# Patient Record
Sex: Female | Born: 1945 | Race: Black or African American | Hispanic: No | Smoking: Former smoker
Health system: Southern US, Community
[De-identification: ages and names within clinical notes are randomized; demographics above are authoritative.]

## PROBLEM LIST (undated history)

## (undated) DIAGNOSIS — B1920 Unspecified viral hepatitis C without hepatic coma: Secondary | ICD-10-CM

## (undated) DIAGNOSIS — K7460 Unspecified cirrhosis of liver: Secondary | ICD-10-CM

## (undated) DIAGNOSIS — G8929 Other chronic pain: Secondary | ICD-10-CM

## (undated) DIAGNOSIS — F319 Bipolar disorder, unspecified: Secondary | ICD-10-CM

## (undated) DIAGNOSIS — Z87442 Personal history of urinary calculi: Secondary | ICD-10-CM

## (undated) DIAGNOSIS — H409 Unspecified glaucoma: Secondary | ICD-10-CM

## (undated) DIAGNOSIS — M549 Dorsalgia, unspecified: Secondary | ICD-10-CM

## (undated) DIAGNOSIS — F0280 Dementia in other diseases classified elsewhere without behavioral disturbance: Secondary | ICD-10-CM

## (undated) DIAGNOSIS — D696 Thrombocytopenia, unspecified: Secondary | ICD-10-CM

## (undated) DIAGNOSIS — B182 Chronic viral hepatitis C: Secondary | ICD-10-CM

## (undated) DIAGNOSIS — M1990 Unspecified osteoarthritis, unspecified site: Secondary | ICD-10-CM

## (undated) DIAGNOSIS — G9340 Encephalopathy, unspecified: Secondary | ICD-10-CM

## (undated) DIAGNOSIS — F419 Anxiety disorder, unspecified: Secondary | ICD-10-CM

## (undated) DIAGNOSIS — F32A Depression, unspecified: Secondary | ICD-10-CM

## (undated) DIAGNOSIS — F329 Major depressive disorder, single episode, unspecified: Secondary | ICD-10-CM

## (undated) DIAGNOSIS — G309 Alzheimer's disease, unspecified: Secondary | ICD-10-CM

## (undated) DIAGNOSIS — I10 Essential (primary) hypertension: Secondary | ICD-10-CM

## (undated) DIAGNOSIS — R32 Unspecified urinary incontinence: Secondary | ICD-10-CM

## (undated) HISTORY — DX: Depression, unspecified: F32.A

## (undated) HISTORY — DX: Essential (primary) hypertension: I10

## (undated) HISTORY — PX: CATARACT EXTRACTION W/ INTRAOCULAR LENS  IMPLANT, BILATERAL: SHX1307

## (undated) HISTORY — DX: Thrombocytopenia, unspecified: D69.6

## (undated) HISTORY — DX: Unspecified glaucoma: H40.9

## (undated) HISTORY — DX: Unspecified urinary incontinence: R32

## (undated) HISTORY — DX: Encephalopathy, unspecified: G93.40

## (undated) HISTORY — DX: Unspecified osteoarthritis, unspecified site: M19.90

## (undated) HISTORY — DX: Bipolar disorder, unspecified: F31.9

## (undated) HISTORY — DX: Major depressive disorder, single episode, unspecified: F32.9

## (undated) HISTORY — PX: POLYPECTOMY: SHX149

## (undated) HISTORY — DX: Other chronic pain: G89.29

## (undated) HISTORY — PX: CHOLECYSTECTOMY: SHX55

## (undated) HISTORY — DX: Unspecified cirrhosis of liver: K74.60

## (undated) HISTORY — DX: Chronic viral hepatitis C: B18.2

## (undated) HISTORY — PX: EYE SURGERY: SHX253

## (undated) HISTORY — DX: Dorsalgia, unspecified: M54.9

## (undated) HISTORY — DX: Anxiety disorder, unspecified: F41.9

## (undated) HISTORY — DX: Unspecified viral hepatitis C without hepatic coma: B19.20

---

## 2001-05-09 ENCOUNTER — Emergency Department (HOSPITAL_COMMUNITY): Admission: EM | Admit: 2001-05-09 | Discharge: 2001-05-09 | Payer: Self-pay | Admitting: Emergency Medicine

## 2001-06-05 ENCOUNTER — Ambulatory Visit (HOSPITAL_COMMUNITY): Admission: RE | Admit: 2001-06-05 | Discharge: 2001-06-05 | Payer: Self-pay | Admitting: Family Medicine

## 2001-06-05 ENCOUNTER — Encounter: Payer: Self-pay | Admitting: Family Medicine

## 2001-07-23 ENCOUNTER — Ambulatory Visit (HOSPITAL_COMMUNITY): Admission: RE | Admit: 2001-07-23 | Discharge: 2001-07-23 | Payer: Self-pay | Admitting: Family Medicine

## 2001-07-23 ENCOUNTER — Encounter: Payer: Self-pay | Admitting: Family Medicine

## 2001-08-06 ENCOUNTER — Ambulatory Visit (HOSPITAL_COMMUNITY): Admission: RE | Admit: 2001-08-06 | Discharge: 2001-08-06 | Payer: Self-pay | Admitting: Family Medicine

## 2001-08-07 ENCOUNTER — Encounter: Payer: Self-pay | Admitting: Family Medicine

## 2002-07-07 ENCOUNTER — Other Ambulatory Visit: Admission: RE | Admit: 2002-07-07 | Discharge: 2002-07-07 | Payer: Self-pay | Admitting: Family Medicine

## 2002-07-11 ENCOUNTER — Ambulatory Visit (HOSPITAL_COMMUNITY): Admission: RE | Admit: 2002-07-11 | Discharge: 2002-07-11 | Payer: Self-pay | Admitting: Family Medicine

## 2002-08-06 ENCOUNTER — Encounter: Payer: Self-pay | Admitting: Ophthalmology

## 2002-08-08 ENCOUNTER — Encounter: Payer: Self-pay | Admitting: Ophthalmology

## 2002-08-08 ENCOUNTER — Ambulatory Visit (HOSPITAL_COMMUNITY): Admission: RE | Admit: 2002-08-08 | Discharge: 2002-08-08 | Payer: Self-pay | Admitting: Ophthalmology

## 2003-02-23 ENCOUNTER — Ambulatory Visit (HOSPITAL_COMMUNITY): Admission: RE | Admit: 2003-02-23 | Discharge: 2003-02-23 | Payer: Self-pay | Admitting: Ophthalmology

## 2003-07-15 ENCOUNTER — Ambulatory Visit (HOSPITAL_COMMUNITY): Admission: RE | Admit: 2003-07-15 | Discharge: 2003-07-15 | Payer: Self-pay | Admitting: Family Medicine

## 2003-09-29 ENCOUNTER — Other Ambulatory Visit: Admission: RE | Admit: 2003-09-29 | Discharge: 2003-09-29 | Payer: Self-pay | Admitting: Family Medicine

## 2004-07-06 ENCOUNTER — Ambulatory Visit: Payer: Self-pay | Admitting: Family Medicine

## 2004-07-13 ENCOUNTER — Ambulatory Visit: Payer: Self-pay | Admitting: Family Medicine

## 2004-08-15 ENCOUNTER — Ambulatory Visit (HOSPITAL_COMMUNITY): Admission: RE | Admit: 2004-08-15 | Discharge: 2004-08-15 | Payer: Self-pay | Admitting: Ophthalmology

## 2004-08-19 ENCOUNTER — Ambulatory Visit: Payer: Self-pay | Admitting: Family Medicine

## 2004-08-24 ENCOUNTER — Ambulatory Visit (HOSPITAL_COMMUNITY): Admission: RE | Admit: 2004-08-24 | Discharge: 2004-08-24 | Payer: Self-pay | Admitting: Family Medicine

## 2004-11-09 ENCOUNTER — Ambulatory Visit: Payer: Self-pay | Admitting: Family Medicine

## 2004-12-02 ENCOUNTER — Ambulatory Visit (HOSPITAL_COMMUNITY): Admission: RE | Admit: 2004-12-02 | Discharge: 2004-12-02 | Payer: Self-pay | Admitting: Ophthalmology

## 2004-12-09 ENCOUNTER — Ambulatory Visit: Payer: Self-pay | Admitting: Family Medicine

## 2005-01-02 ENCOUNTER — Ambulatory Visit: Payer: Self-pay | Admitting: Family Medicine

## 2005-03-08 ENCOUNTER — Ambulatory Visit: Payer: Self-pay | Admitting: Family Medicine

## 2005-04-12 ENCOUNTER — Ambulatory Visit: Payer: Self-pay | Admitting: Family Medicine

## 2005-04-12 ENCOUNTER — Other Ambulatory Visit: Admission: RE | Admit: 2005-04-12 | Discharge: 2005-04-12 | Payer: Self-pay | Admitting: Family Medicine

## 2005-08-09 ENCOUNTER — Ambulatory Visit: Payer: Self-pay | Admitting: Family Medicine

## 2005-09-01 ENCOUNTER — Ambulatory Visit (HOSPITAL_COMMUNITY): Admission: RE | Admit: 2005-09-01 | Discharge: 2005-09-01 | Payer: Self-pay | Admitting: Family Medicine

## 2005-09-20 ENCOUNTER — Ambulatory Visit: Payer: Self-pay | Admitting: Family Medicine

## 2005-11-09 ENCOUNTER — Ambulatory Visit (HOSPITAL_COMMUNITY): Admission: RE | Admit: 2005-11-09 | Discharge: 2005-11-09 | Payer: Self-pay | Admitting: Hematology and Oncology

## 2005-11-09 ENCOUNTER — Ambulatory Visit: Payer: Self-pay | Admitting: Family Medicine

## 2006-01-12 ENCOUNTER — Ambulatory Visit: Payer: Self-pay | Admitting: Family Medicine

## 2006-02-01 ENCOUNTER — Ambulatory Visit: Payer: Self-pay | Admitting: Gastroenterology

## 2006-02-09 ENCOUNTER — Encounter (INDEPENDENT_AMBULATORY_CARE_PROVIDER_SITE_OTHER): Payer: Self-pay | Admitting: Specialist

## 2006-02-09 ENCOUNTER — Ambulatory Visit: Payer: Self-pay | Admitting: Gastroenterology

## 2006-03-14 ENCOUNTER — Ambulatory Visit: Payer: Self-pay | Admitting: Family Medicine

## 2006-03-29 ENCOUNTER — Ambulatory Visit: Payer: Self-pay | Admitting: Family Medicine

## 2006-07-06 ENCOUNTER — Ambulatory Visit: Payer: Self-pay | Admitting: Family Medicine

## 2006-07-11 ENCOUNTER — Ambulatory Visit (HOSPITAL_COMMUNITY): Admission: RE | Admit: 2006-07-11 | Discharge: 2006-07-11 | Payer: Self-pay | Admitting: Internal Medicine

## 2006-08-09 ENCOUNTER — Ambulatory Visit: Payer: Self-pay | Admitting: Family Medicine

## 2006-09-03 ENCOUNTER — Ambulatory Visit: Payer: Self-pay | Admitting: Family Medicine

## 2006-09-05 ENCOUNTER — Ambulatory Visit (HOSPITAL_COMMUNITY): Admission: RE | Admit: 2006-09-05 | Discharge: 2006-09-05 | Payer: Self-pay | Admitting: Internal Medicine

## 2006-11-02 ENCOUNTER — Ambulatory Visit (HOSPITAL_COMMUNITY): Admission: RE | Admit: 2006-11-02 | Discharge: 2006-11-02 | Payer: Self-pay | Admitting: General Surgery

## 2006-11-06 ENCOUNTER — Ambulatory Visit (HOSPITAL_COMMUNITY): Admission: RE | Admit: 2006-11-06 | Discharge: 2006-11-07 | Payer: Self-pay | Admitting: General Surgery

## 2007-01-24 ENCOUNTER — Ambulatory Visit: Payer: Self-pay | Admitting: Family Medicine

## 2007-03-26 ENCOUNTER — Ambulatory Visit: Payer: Self-pay | Admitting: Family Medicine

## 2007-04-15 ENCOUNTER — Ambulatory Visit: Payer: Self-pay | Admitting: Internal Medicine

## 2007-04-18 ENCOUNTER — Ambulatory Visit (HOSPITAL_COMMUNITY): Admission: RE | Admit: 2007-04-18 | Discharge: 2007-04-18 | Payer: Self-pay | Admitting: Internal Medicine

## 2007-08-22 ENCOUNTER — Ambulatory Visit: Payer: Self-pay | Admitting: Internal Medicine

## 2007-09-04 ENCOUNTER — Ambulatory Visit: Payer: Self-pay | Admitting: Internal Medicine

## 2007-09-04 ENCOUNTER — Encounter: Payer: Self-pay | Admitting: Family Medicine

## 2007-09-04 ENCOUNTER — Other Ambulatory Visit: Admission: RE | Admit: 2007-09-04 | Discharge: 2007-09-04 | Payer: Self-pay | Admitting: Family Medicine

## 2007-09-09 ENCOUNTER — Ambulatory Visit (HOSPITAL_COMMUNITY): Admission: RE | Admit: 2007-09-09 | Discharge: 2007-09-09 | Payer: Self-pay | Admitting: Family Medicine

## 2007-11-25 ENCOUNTER — Encounter (INDEPENDENT_AMBULATORY_CARE_PROVIDER_SITE_OTHER): Payer: Self-pay | Admitting: Family Medicine

## 2007-11-25 ENCOUNTER — Ambulatory Visit: Payer: Self-pay | Admitting: Internal Medicine

## 2007-11-25 LAB — CONVERTED CEMR LAB: TSH: 0.577 microintl units/mL (ref 0.350–5.50)

## 2007-12-03 ENCOUNTER — Ambulatory Visit (HOSPITAL_COMMUNITY): Admission: RE | Admit: 2007-12-03 | Discharge: 2007-12-03 | Payer: Self-pay | Admitting: Family Medicine

## 2008-02-26 ENCOUNTER — Encounter (INDEPENDENT_AMBULATORY_CARE_PROVIDER_SITE_OTHER): Payer: Self-pay | Admitting: Family Medicine

## 2008-02-26 ENCOUNTER — Ambulatory Visit: Payer: Self-pay | Admitting: Internal Medicine

## 2008-02-26 LAB — CONVERTED CEMR LAB
ALT: 29 units/L (ref 0–35)
AST: 32 units/L (ref 0–37)
Albumin: 3.9 g/dL (ref 3.5–5.2)
Alkaline Phosphatase: 79 units/L (ref 39–117)
BUN: 12 mg/dL (ref 6–23)
CO2: 22 meq/L (ref 19–32)
Calcium: 9.4 mg/dL (ref 8.4–10.5)
Chloride: 103 meq/L (ref 96–112)
Cholesterol: 160 mg/dL (ref 0–200)
Creatinine, Ser: 0.67 mg/dL (ref 0.40–1.20)
Glucose, Bld: 89 mg/dL (ref 70–99)
HDL: 45 mg/dL (ref >39)
LDL Cholesterol: 88 mg/dL (ref 0–99)
Microalb, Ur: 0.24 mg/dL (ref 0.00–1.89)
Potassium: 4 meq/L (ref 3.5–5.3)
Sodium: 139 meq/L (ref 135–145)
Total Bilirubin: 0.5 mg/dL (ref 0.3–1.2)
Total CHOL/HDL Ratio: 3.6
Total Protein: 7.5 g/dL (ref 6.0–8.3)
Triglycerides: 137 mg/dL (ref <150)
VLDL: 27 mg/dL (ref 0–40)

## 2008-05-12 ENCOUNTER — Ambulatory Visit: Payer: Self-pay | Admitting: Family Medicine

## 2008-08-13 ENCOUNTER — Ambulatory Visit: Payer: Self-pay | Admitting: Family Medicine

## 2008-10-09 ENCOUNTER — Ambulatory Visit (HOSPITAL_COMMUNITY): Admission: RE | Admit: 2008-10-09 | Discharge: 2008-10-09 | Payer: Self-pay | Admitting: Family Medicine

## 2008-11-13 ENCOUNTER — Ambulatory Visit: Payer: Self-pay | Admitting: Family Medicine

## 2008-12-01 ENCOUNTER — Ambulatory Visit: Payer: Self-pay | Admitting: Internal Medicine

## 2008-12-01 ENCOUNTER — Encounter (INDEPENDENT_AMBULATORY_CARE_PROVIDER_SITE_OTHER): Payer: Self-pay | Admitting: Family Medicine

## 2008-12-01 LAB — CONVERTED CEMR LAB
BUN: 12 mg/dL (ref 6–23)
Basophils Absolute: 0 10*3/uL (ref 0.0–0.1)
Basophils Relative: 1 % (ref 0–1)
CO2: 21 meq/L (ref 19–32)
Calcium: 9.5 mg/dL (ref 8.4–10.5)
Chloride: 102 meq/L (ref 96–112)
Creatinine, Ser: 0.7 mg/dL (ref 0.40–1.20)
Eosinophils Absolute: 0.1 10*3/uL (ref 0.0–0.7)
Eosinophils Relative: 3 % (ref 0–5)
Free T4: 0.76 ng/dL — ABNORMAL LOW (ref 0.89–1.80)
Glucose, Bld: 100 mg/dL — ABNORMAL HIGH (ref 70–99)
HCT: 41.4 % (ref 36.0–46.0)
Hemoglobin: 13.4 g/dL (ref 12.0–15.0)
Lymphocytes Relative: 41 % (ref 12–46)
Lymphs Abs: 1.8 10*3/uL (ref 0.7–4.0)
MCHC: 32.4 g/dL (ref 30.0–36.0)
MCV: 85.5 fL (ref 78.0–100.0)
Monocytes Absolute: 0.3 10*3/uL (ref 0.1–1.0)
Monocytes Relative: 7 % (ref 3–12)
Neutro Abs: 2.1 10*3/uL (ref 1.7–7.7)
Neutrophils Relative %: 48 % (ref 43–77)
Platelets: 162 10*3/uL (ref 150–400)
Potassium: 4.3 meq/L (ref 3.5–5.3)
RBC: 4.84 M/uL (ref 3.87–5.11)
RDW: 15.1 % (ref 11.5–15.5)
Sodium: 139 meq/L (ref 135–145)
Vit D, 1,25-Dihydroxy: 17 — ABNORMAL LOW (ref 30–89)
Vitamin B-12: 447 pg/mL (ref 211–911)
WBC: 4.3 10*3/uL (ref 4.0–10.5)

## 2008-12-17 ENCOUNTER — Ambulatory Visit: Payer: Self-pay | Admitting: Family Medicine

## 2009-06-17 ENCOUNTER — Ambulatory Visit: Payer: Self-pay | Admitting: Family Medicine

## 2009-09-17 ENCOUNTER — Ambulatory Visit: Payer: Self-pay | Admitting: Family Medicine

## 2009-09-17 LAB — CONVERTED CEMR LAB
BUN: 13 mg/dL (ref 6–23)
CO2: 23 meq/L (ref 19–32)
Calcium: 9.7 mg/dL (ref 8.4–10.5)
Chloride: 100 meq/L (ref 96–112)
Creatinine, Ser: 0.69 mg/dL (ref 0.40–1.20)
Free T4: 0.94 ng/dL (ref 0.80–1.80)
Glucose, Bld: 59 mg/dL — ABNORMAL LOW (ref 70–99)
Microalb, Ur: 0.5 mg/dL (ref 0.00–1.89)
Potassium: 4.1 meq/L (ref 3.5–5.3)
Sodium: 136 meq/L (ref 135–145)
TSH: 0.831 microintl units/mL (ref 0.350–4.500)
Vit D, 25-Hydroxy: 46 ng/mL (ref 30–89)

## 2010-02-11 ENCOUNTER — Ambulatory Visit: Payer: Self-pay | Admitting: Family Medicine

## 2010-05-09 ENCOUNTER — Ambulatory Visit: Payer: Self-pay | Admitting: Family Medicine

## 2010-05-17 ENCOUNTER — Ambulatory Visit: Payer: Self-pay | Admitting: Internal Medicine

## 2010-05-17 ENCOUNTER — Encounter (INDEPENDENT_AMBULATORY_CARE_PROVIDER_SITE_OTHER): Payer: Self-pay | Admitting: Family Medicine

## 2010-05-17 LAB — CONVERTED CEMR LAB
ALT: 31 units/L (ref 0–35)
AST: 36 units/L (ref 0–37)
Albumin: 4.3 g/dL (ref 3.5–5.2)
Alkaline Phosphatase: 103 units/L (ref 39–117)
BUN: 8 mg/dL (ref 6–23)
CO2: 27 meq/L (ref 19–32)
Calcium: 10.1 mg/dL (ref 8.4–10.5)
Chloride: 100 meq/L (ref 96–112)
Cholesterol: 199 mg/dL (ref 0–200)
Creatinine, Ser: 0.71 mg/dL (ref 0.40–1.20)
Glucose, Bld: 56 mg/dL — ABNORMAL LOW (ref 70–99)
HDL: 60 mg/dL (ref >39)
LDL Cholesterol: 118 mg/dL — ABNORMAL HIGH (ref 0–99)
Potassium: 4.1 meq/L (ref 3.5–5.3)
Sodium: 135 meq/L (ref 135–145)
Total Bilirubin: 0.5 mg/dL (ref 0.3–1.2)
Total CHOL/HDL Ratio: 3.3
Total Protein: 8 g/dL (ref 6.0–8.3)
Triglycerides: 103 mg/dL (ref <150)
VLDL: 21 mg/dL (ref 0–40)

## 2010-06-09 ENCOUNTER — Ambulatory Visit: Payer: Self-pay | Admitting: Family Medicine

## 2010-06-16 ENCOUNTER — Ambulatory Visit (HOSPITAL_COMMUNITY): Admission: RE | Admit: 2010-06-16 | Discharge: 2010-06-16 | Payer: Self-pay | Admitting: Family Medicine

## 2010-07-06 ENCOUNTER — Ambulatory Visit (HOSPITAL_COMMUNITY)
Admission: RE | Admit: 2010-07-06 | Discharge: 2010-07-06 | Payer: Self-pay | Source: Home / Self Care | Admitting: Ophthalmology

## 2010-10-27 ENCOUNTER — Encounter (INDEPENDENT_AMBULATORY_CARE_PROVIDER_SITE_OTHER): Payer: Self-pay | Admitting: Family Medicine

## 2010-10-27 LAB — CONVERTED CEMR LAB: Microalb, Ur: 1.18 mg/dL (ref 0.00–1.89)

## 2010-12-29 ENCOUNTER — Other Ambulatory Visit (HOSPITAL_COMMUNITY): Payer: Self-pay | Admitting: Family Medicine

## 2010-12-29 ENCOUNTER — Other Ambulatory Visit (HOSPITAL_COMMUNITY)
Admission: RE | Admit: 2010-12-29 | Discharge: 2010-12-29 | Disposition: A | Discharge status: Home / Self Care | Payer: Medicare Other | Source: Ambulatory Visit | Attending: Family Medicine | Admitting: Family Medicine

## 2010-12-29 ENCOUNTER — Encounter (INDEPENDENT_AMBULATORY_CARE_PROVIDER_SITE_OTHER): Payer: Self-pay | Admitting: Family Medicine

## 2010-12-29 ENCOUNTER — Other Ambulatory Visit: Payer: Self-pay | Admitting: Family Medicine

## 2010-12-29 DIAGNOSIS — Z1231 Encounter for screening mammogram for malignant neoplasm of breast: Secondary | ICD-10-CM

## 2010-12-29 DIAGNOSIS — Z01419 Encounter for gynecological examination (general) (routine) without abnormal findings: Secondary | ICD-10-CM | POA: Insufficient documentation

## 2010-12-29 LAB — CONVERTED CEMR LAB: Microalb, Ur: 0.66 mg/dL (ref 0.00–1.89)

## 2010-12-30 ENCOUNTER — Encounter (INDEPENDENT_AMBULATORY_CARE_PROVIDER_SITE_OTHER): Payer: Self-pay | Admitting: *Deleted

## 2011-01-05 ENCOUNTER — Ambulatory Visit (HOSPITAL_COMMUNITY)
Admission: RE | Admit: 2011-01-05 | Discharge: 2011-01-05 | Disposition: A | Discharge status: Home / Self Care | Payer: Medicare Other | Source: Ambulatory Visit | Attending: Family Medicine | Admitting: Family Medicine

## 2011-01-05 DIAGNOSIS — Z1231 Encounter for screening mammogram for malignant neoplasm of breast: Secondary | ICD-10-CM

## 2011-01-05 NOTE — Letter (Signed)
Summary: Pre Visit Letter Revised  [facility]  [address] [place], [place] [address]   Phone: [phone]  Fax: [phone]        12/30/2010 MRN: [id number] [facility] [address] [place], [place]  [address]             Procedure Date:  02-09-11           Recall Colon--Dr. Arlyce Dice   Welcome to the Gastroenterology Division at [facility].    You are scheduled to see a nurse for your pre-procedure visit on 01-26-11 at 1:30p.m. on the 3rd floor at [facility], [address]  We ask that you try to arrive at our office 15 minutes prior to your appointment time to allow for check-in.  Please take a minute to review the attached form.  If you answer "Yes" to one or more of the questions on the first page, we ask that you call the person listed at your earliest opportunity.  If you answer "No" to all of the questions, please complete the rest of the form and bring it to your appointment.    Your nurse visit will consist of discussing your medical and surgical history, your immediate family medical history, and your medications.   If you are unable to list all of your medications on the form, please bring the medication bottles to your appointment and we will list them.  We will need to be aware of both prescribed and over the counter drugs.  We will need to know exact dosage information as well.    Please be prepared to read and sign documents such as consent forms, a financial agreement, and acknowledgement forms.  If necessary, and with your consent, a friend or relative is welcome to sit-in on the nurse visit with you.  Please bring your insurance card so that we may make a copy of it.  If your insurance requires a referral to see a specialist, please bring your referral form from your primary care physician.  No co-pay is required for this nurse visit.     If you cannot keep your appointment, please call [phone] to cancel or reschedule prior to  your appointment date.  This allows [place] the opportunity to schedule an appointment for another patient in need of care.    Thank you for choosing [facility] for your medical needs.  We appreciate the opportunity to care for you.  Please visit [place] at our website  to learn more about our practice.  Sincerely, The Gastroenterology Division

## 2011-01-26 ENCOUNTER — Ambulatory Visit (AMBULATORY_SURGERY_CENTER): Payer: Medicare Other | Admitting: *Deleted

## 2011-01-26 VITALS — Ht 62.0 in | Wt 161.0 lb

## 2011-01-26 DIAGNOSIS — Z86010 Personal history of colonic polyps: Secondary | ICD-10-CM

## 2011-01-26 MED ORDER — PEG-KCL-NACL-NASULF-NA ASC-C 100 G PO SOLR: 1.0000 | Freq: Once | ORAL | Status: AC

## 2011-02-08 ENCOUNTER — Encounter: Payer: Self-pay | Admitting: Gastroenterology

## 2011-02-09 ENCOUNTER — Encounter: Payer: Self-pay | Admitting: Gastroenterology

## 2011-02-09 ENCOUNTER — Ambulatory Visit: Payer: Medicare Other | Admitting: Gastroenterology

## 2011-02-09 VITALS — BP 177/91 | HR 70 | Temp 97.1°F | Resp 17 | Ht 62.0 in | Wt 161.0 lb

## 2011-02-09 DIAGNOSIS — Z86010 Personal history of colonic polyps: Secondary | ICD-10-CM

## 2011-02-09 DIAGNOSIS — Z1211 Encounter for screening for malignant neoplasm of colon: Secondary | ICD-10-CM

## 2011-02-09 LAB — GLUCOSE, CAPILLARY
Glucose-Capillary: 86 mg/dL (ref 70–99)
Glucose-Capillary: 63 mg/dL — ABNORMAL LOW (ref 70–99)
Glucose-Capillary: 65 mg/dL — ABNORMAL LOW (ref 70–99)
Glucose-Capillary: 99 mg/dL (ref 70–99)
Glucose-Capillary: 177 mg/dL — ABNORMAL HIGH (ref 70–99)

## 2011-02-09 MED ORDER — DEXTROSE 5 % IV SOLN: INTRAVENOUS | Status: DC

## 2011-02-09 NOTE — Patient Instructions (Signed)
Discharge given with verbal understanding. Resume previous medications.

## 2011-02-09 NOTE — Progress Notes (Signed)
Pressure applied to abdomen to reach cecum.  

## 2011-02-10 ENCOUNTER — Telehealth: Payer: Self-pay | Admitting: *Deleted

## 2011-02-10 NOTE — Telephone Encounter (Signed)

## 2011-02-28 ENCOUNTER — Inpatient Hospital Stay (INDEPENDENT_AMBULATORY_CARE_PROVIDER_SITE_OTHER)
Admission: RE | Admit: 2011-02-28 | Discharge: 2011-02-28 | Disposition: A | Discharge status: Home / Self Care | Payer: Medicare Other | Source: Ambulatory Visit

## 2011-02-28 ENCOUNTER — Ambulatory Visit (INDEPENDENT_AMBULATORY_CARE_PROVIDER_SITE_OTHER): Payer: Medicare Other

## 2011-02-28 DIAGNOSIS — I10 Essential (primary) hypertension: Secondary | ICD-10-CM

## 2011-02-28 DIAGNOSIS — M67919 Unspecified disorder of synovium and tendon, unspecified shoulder: Secondary | ICD-10-CM

## 2011-03-17 NOTE — Op Note (Signed)
NAMEJORDAIN, RADIN                ACCOUNT NO.:  [id number]   MEDICAL RECORD NO.:  [id number]          PATIENT TYPE:  OIB   LOCATION:  0098                         FACILITY:  [facility]   PHYSICIAN:  Adolph Pollack, M.D.DATE OF BIRTH:  1946/02/10   DATE OF PROCEDURE:  11/06/2006  DATE OF DISCHARGE:                               OPERATIVE REPORT   PREOPERATIVE DIAGNOSIS:  Ventral incisional hernia.   POSTOPERATIVE DIAGNOSIS:  Ventral incisional hernia, with a macronodular  cirrhosis.   PROCEDURE:  Laparoscopic ventral incisional hernia repair with mesh.   SURGEON:  Adolph Pollack, M.D.   ASSISTANT:  Dessie Coma, RNFA   ANESTHESIA:  General.   INDICATIONS:  This a 65 year old female who had a cholecystectomy  through a right subcostal incision.  She had developed a bulge in the  medial aspect of her subcostal incision and it was starting to become  symptomatic.  CT scan had been performed demonstrating the hernia and  now she presents for laparoscopic there.  We discussed procedure risks  and aftercare preoperatively.   TECHNIQUE:  She is seen in the holding area.  An IV had been placed into  the jugular vein.  She was then brought to the operating room, placed  supine on the operating table and general anesthetic was administered.  Foley catheter was placed in the bladder.  The abdominal wall was  sterilely prepped and draped.  Local anesthetic was infiltrated the  subumbilical region.  The subumbilical incision was made through the  skin, subcutaneous tissue, fascia and peritoneum, entering the  peritoneal cavity.  A pursestring suture of 0 Vicryl was placed around  the fascial edges.  A Hassan trocar was introduced into the peritoneal  cavity and a pneumoperitoneum was created by insufflation of CO2 gas.   A laparoscope was introduced.  I could see the hernia with omentum up in  the hernia and epigastric region and at the medial portion of the  subcostal incision.  I  also noted macronodular changes of the liver  consistent with macronodular cirrhosis in a lady with known hepatitis C.  I took pictures of this.   I placed a 10 mm trocar on the left lower quadrant and a 5 mm trocar in  the right lower quadrant.  I reduced the omentum of the hernia.  I  dissected the falciform ligament away from the anterior abdominal wall  so I could have adequate overlap space.  I then used the spinal needle  to mark the periphery of the hernia and measured 3-4 cm away from this  area in four quadrants.  I brought a piece of 15 x 10 mesh into the  field with a nonadherent barrier on it (Parietex).  This was cut to  appropriate size and four anchoring sutures of  #1 Novofil were placed.  The mesh was hydrated and placed into the abdominal cavity.  Stab wounds  were made in four quadrants surrounding the hernia and the anchoring  sutures were brought up across the fascial bridge in all four quadrants  and tied down anchoring the mesh up  to the anterior abdominal wall with  the nonadherent barrier side facing viscera.  I then used spiral tacker  to further anchor the mesh to the abdominal wall.  I inspected the area.  This provided for more than adequate coverage of the defect with good  overlap.  I then inspected the abdominal cavity and saw no bleeding or  no evidence of intestinal injury.  I then removed through the  subumbilical trocar and closed the fascial defect under laparoscopic  vision by tightening up and tying down the pursestring suture.  I saw no  evidence of umbilical hernia.  I removed the remaining trocars and  released the pneumoperitoneum.   The skin incisions were then closed with 4-0 Monocryl subcuticular  stitches.  Steri-Strips and sterile dressings were applied.   She tolerated procedure well without apparent complications was taken to  recovery in satisfactory condition.      Adolph Pollack, M.D.  Electronically Signed     TJR/MEDQ   D:  11/06/2006  T:  11/06/2006  Job:  [id number]

## 2011-03-17 NOTE — Op Note (Signed)
Carol Rubio, Carol Rubio                          ACCOUNT NO.:  [id number]   MEDICAL RECORD NO.:  [id number]                   PATIENT TYPE:  OIB   LOCATION:  2899                                 FACILITY:  [facility]   PHYSICIAN:  Salley Scarlet., MD           DATE OF BIRTH:  03-21-1946   DATE OF PROCEDURE:  DATE OF DISCHARGE:  08/08/2002                                 OPERATIVE REPORT   PREOPERATIVE DIAGNOSES:  Immature cataract, right eye.   POSTOPERATIVE DIAGNOSES:  Immature cataract, right eye.   PROCEDURE:  Kelman phacoemulsification of cataract, right eye,   ANESTHESIA:  Local using Xylocaine 2% and Marcaine 0.75% with Wydase.   INDICATIONS FOR PROCEDURE:  This is a 65 year old diabetic lady who  complains of blurring of vision with difficulty being able to read and  recognizing faces.  She was evaluated and found to have bilateral immature  cataracts with visual acuity best-corrected at 20/60 on the right and 20/50  on the left.  Cataract extraction of the right eye is recommended.  She is  admitted at this time for that purpose.   PROCEDURE:  Under the influence of IV sedation and Van Lint akinesia,  retrobulbar anesthesia was given.  The patient was prepped and draped in the  usual manner.  The lid speculum was inserted under the upper and lower lid  of the right eye, and a 4-0 silk traction suture was fastened to the bed of  the superior rectus muscle for retraction.  A fornix-based conjunctival flap  was turned, and hemostasis was achieved by using cautery.  An incision was  made in the sclera approximately 1 mm posterior to the limbus.  This  incision was dissected down to clear cornea using ____________ blade.  A  _______ port incision was made at the 135 position.  Occucoat was injected  into the eye through a stab wound incision.  The anterior chamber was then  entered through the corneoscleral incision at the 11:30 o'clock position.  An anterior capsulotomy was  then done using a bent 25-gauge needle.  The  nucleus was hydrodissected using Xylocaine.  The KP handpiece  _______________, and the nucleus was emulsified without difficulty.  The  residual cortical material was aspirated.  The posterior capsule was  polished with an olive-tipped polisher. ________________ with difficulty.  The residual Occucoat was aspirated, and the anterior chamber was fully  formed using Miochol.  The lips of the wound were hydrated and tested to  make sure that there was leak.  After ascertaining that there was no wound  leak, the conjunctiva was closed using thermocautery.  1 cc of Celestone and  0.5 cc of gentamicin were injected subconjunctivally.  Maxitrol ophthalmic  ointment and Pilopine drops were applied, along with a patch and soft  shield.  The patient tolerated the procedure well and was discharged to the  postanesthesia recovery room in satisfactory condition.  She was instructed  to refrain from any strenuous activity and given _________________.   DISCHARGE DIAGNOSES:  Immature cataract, right eye.                                               Salley Scarlet., MD    TB/MEDQ  D:  08/10/2002  T:  08/10/2002  Job:  [id number]

## 2011-03-17 NOTE — Op Note (Signed)
Carol Rubio, Carol Rubio                ACCOUNT NO.:  [id number]   MEDICAL RECORD NO.:  [id number]          PATIENT TYPE:  OIB   LOCATION:  2899                         FACILITY:  [facility]   PHYSICIAN:  Salley Scarlet., M.D.DATE OF BIRTH:  December 02, 1945   DATE OF PROCEDURE:  DATE OF DISCHARGE:  12/02/2004                                 OPERATIVE REPORT   PREOPERATIVE DIAGNOSIS:  Immature cataract, left eye.   POSTOPERATIVE DIAGNOSIS:  Immature cataract, left eye.   OPERATION:  Kelman phacoemulsification of cataract, left eye.   ANESTHESIA:  Local using Xylocaine 2%, Marcaine 0.75%, and Wydase.   JUSTIFICATION FOR THE PROCEDURE:  This is a 65 year old lady who complains  of blurring of vision.  She has had a previous cataract extraction from the  right eye but now complains of difficulty seeing to read and to drive.  She  was evaluated and found to have a visual acuity best corrected to 20/30 on  the right, 20/60 on the left.  There was an intraocular lens implant on the  right but a 2+ nuclear sclerotic cataract on the left.  Cataract extraction  with extraocular lens implantation was recommended.  She is admitted at this  time for that proposed procedure.   DESCRIPTION OF PROCEDURE:  Under the influence of IV sedation, the Van Lint  akinesia and retrobulbar anesthesia was given.  The patient was prepped and  draped in the usual manner.  The lid speculum was inserted under the upper  and lower lid of the left eye and the 4-0 silk traction suture was passed  through the belly of the superior rectus muscle for traction.  The fornix-  based conjunctival flap was turned and hemostasis was achieved using  cautery.  An incision was made in the sclera at the limbus.  This incision  was dissected down into nuclear cornea using a crescent blade.  A side port  incision was made at the 1:30 o'clock position.  OcuCoat was injected into  the eye through the side port incision.  The anterior  chamber was then  entered through the corneoscleral tunnel incision at the 11:30 o'clock  position.  An anterior capsulotomy was done using a bent 25-gauge needle.  The nucleus was hydrodissected using Xylocaine.  The KPE handpiece was  passed into the eye and the nucleus was emulsified without difficulty.  The  residual cortical material was aspirated.  The posterior capsule was  polished using an olive-tip polisher.  The wound was widened slightly to  accommodate a foldable intraocular lens.  This lens was seated into the eye  behind the iris without difficulty.  The anterior chamber was reformed and  the pupil was constricted using Miochol.  The lips of the wound were  hydrated and tested to make sure that there was no leak.  Ascertaining there  was no leak, the conjunctiva was closed over the wound using thermocautery.  1 mL of Celestone and 0.5 mL of gentamicin were injected subconjunctivally.  Maxitrol ophthalmic ointment and Pilopine ointment were applied along with a  patch and Fox shield.  The  patient tolerated the procedure well and was  discharged to the  postanesthesia recovery room in satisfactory condition.  She is instructed  to rest today, to take Vicodin every 4 hours as needed for pain and to see  me in the office tomorrow for further evaluation.   DISCHARGE DIAGNOSIS:  Immature cataract, left eye.      TB/MEDQ  D:  12/03/2004  T:  12/04/2004  Job:  [id number]

## 2011-03-20 ENCOUNTER — Emergency Department (HOSPITAL_COMMUNITY)
Admission: EM | Admit: 2011-03-20 | Discharge: 2011-03-20 | Disposition: A | Discharge status: Home / Self Care | Payer: Medicare Other | Attending: Emergency Medicine | Admitting: Emergency Medicine

## 2011-03-20 DIAGNOSIS — M25519 Pain in unspecified shoulder: Secondary | ICD-10-CM | POA: Insufficient documentation

## 2011-03-23 ENCOUNTER — Other Ambulatory Visit (HOSPITAL_COMMUNITY): Payer: Self-pay | Admitting: Family Medicine

## 2011-03-23 DIAGNOSIS — M25411 Effusion, right shoulder: Secondary | ICD-10-CM

## 2011-03-23 DIAGNOSIS — M25511 Pain in right shoulder: Secondary | ICD-10-CM

## 2011-03-30 ENCOUNTER — Ambulatory Visit (HOSPITAL_COMMUNITY)
Admission: RE | Admit: 2011-03-30 | Discharge: 2011-03-30 | Disposition: A | Discharge status: Home / Self Care | Payer: Medicare Other | Source: Ambulatory Visit | Attending: Family Medicine | Admitting: Family Medicine

## 2011-03-30 DIAGNOSIS — M25411 Effusion, right shoulder: Secondary | ICD-10-CM

## 2011-03-30 DIAGNOSIS — M25511 Pain in right shoulder: Secondary | ICD-10-CM

## 2011-04-06 ENCOUNTER — Inpatient Hospital Stay (HOSPITAL_COMMUNITY): Admission: RE | Admit: 2011-04-06 | Payer: Medicare Other | Source: Ambulatory Visit

## 2011-04-07 ENCOUNTER — Ambulatory Visit (HOSPITAL_COMMUNITY)
Admission: RE | Admit: 2011-04-07 | Discharge: 2011-04-07 | Disposition: A | Discharge status: Home / Self Care | Payer: Medicare Other | Source: Ambulatory Visit | Attending: Family Medicine | Admitting: Family Medicine

## 2011-04-07 ENCOUNTER — Other Ambulatory Visit (HOSPITAL_COMMUNITY): Payer: Self-pay | Admitting: Family Medicine

## 2011-04-07 DIAGNOSIS — M25519 Pain in unspecified shoulder: Secondary | ICD-10-CM | POA: Insufficient documentation

## 2011-04-07 DIAGNOSIS — M249 Joint derangement, unspecified: Secondary | ICD-10-CM | POA: Insufficient documentation

## 2011-04-07 DIAGNOSIS — M25619 Stiffness of unspecified shoulder, not elsewhere classified: Secondary | ICD-10-CM | POA: Insufficient documentation

## 2011-04-07 DIAGNOSIS — M75101 Unspecified rotator cuff tear or rupture of right shoulder, not specified as traumatic: Secondary | ICD-10-CM

## 2011-04-07 DIAGNOSIS — M6250 Muscle wasting and atrophy, not elsewhere classified, unspecified site: Secondary | ICD-10-CM | POA: Insufficient documentation

## 2011-04-07 DIAGNOSIS — M19019 Primary osteoarthritis, unspecified shoulder: Secondary | ICD-10-CM | POA: Insufficient documentation

## 2011-04-07 DIAGNOSIS — M6580 Other synovitis and tenosynovitis, unspecified site: Secondary | ICD-10-CM | POA: Insufficient documentation

## 2011-04-07 DIAGNOSIS — R52 Pain, unspecified: Secondary | ICD-10-CM

## 2011-07-12 ENCOUNTER — Ambulatory Visit: Payer: Medicare Other | Attending: Family Medicine | Admitting: Physical Therapy

## 2011-07-12 DIAGNOSIS — IMO0001 Reserved for inherently not codable concepts without codable children: Secondary | ICD-10-CM | POA: Insufficient documentation

## 2011-07-12 DIAGNOSIS — M25619 Stiffness of unspecified shoulder, not elsewhere classified: Secondary | ICD-10-CM | POA: Insufficient documentation

## 2011-07-12 DIAGNOSIS — M25519 Pain in unspecified shoulder: Secondary | ICD-10-CM | POA: Insufficient documentation

## 2011-07-18 ENCOUNTER — Encounter: Payer: Medicare Other | Admitting: Physical Therapy

## 2011-07-24 ENCOUNTER — Ambulatory Visit: Payer: Medicare Other | Admitting: Rehabilitation

## 2011-07-25 ENCOUNTER — Encounter: Payer: PRIVATE HEALTH INSURANCE | Admitting: Physical Therapy

## 2011-07-26 ENCOUNTER — Ambulatory Visit: Payer: Medicare Other | Admitting: Physical Therapy

## 2011-08-02 ENCOUNTER — Ambulatory Visit: Payer: Medicare Other | Attending: Family Medicine | Admitting: Physical Therapy

## 2011-08-02 DIAGNOSIS — M25519 Pain in unspecified shoulder: Secondary | ICD-10-CM | POA: Insufficient documentation

## 2011-08-02 DIAGNOSIS — M25619 Stiffness of unspecified shoulder, not elsewhere classified: Secondary | ICD-10-CM | POA: Insufficient documentation

## 2011-08-02 DIAGNOSIS — IMO0001 Reserved for inherently not codable concepts without codable children: Secondary | ICD-10-CM | POA: Insufficient documentation

## 2011-08-03 ENCOUNTER — Ambulatory Visit: Payer: Medicare Other | Admitting: Physical Therapy

## 2011-08-08 ENCOUNTER — Ambulatory Visit: Payer: Medicare Other | Admitting: Physical Therapy

## 2011-08-10 ENCOUNTER — Ambulatory Visit: Payer: Medicare Other | Admitting: Physical Therapy

## 2011-08-14 ENCOUNTER — Encounter: Payer: Medicare Other | Admitting: Physical Therapy

## 2011-08-15 ENCOUNTER — Ambulatory Visit: Payer: Medicare Other | Admitting: Rehabilitation

## 2011-10-05 ENCOUNTER — Emergency Department (INDEPENDENT_AMBULATORY_CARE_PROVIDER_SITE_OTHER)
Admission: EM | Admit: 2011-10-05 | Discharge: 2011-10-05 | Disposition: A | Discharge status: Home / Self Care | Payer: Medicare Other | Source: Home / Self Care | Attending: Family Medicine | Admitting: Family Medicine

## 2011-10-05 ENCOUNTER — Emergency Department (HOSPITAL_COMMUNITY): Payer: Medicare Other

## 2011-10-05 ENCOUNTER — Encounter (HOSPITAL_COMMUNITY): Payer: Self-pay | Admitting: Emergency Medicine

## 2011-10-05 ENCOUNTER — Emergency Department (HOSPITAL_COMMUNITY)
Admission: EM | Admit: 2011-10-05 | Discharge: 2011-10-05 | Disposition: A | Discharge status: Home / Self Care | Payer: Medicare Other | Attending: Emergency Medicine | Admitting: Emergency Medicine

## 2011-10-05 ENCOUNTER — Other Ambulatory Visit: Payer: Self-pay

## 2011-10-05 DIAGNOSIS — Z7982 Long term (current) use of aspirin: Secondary | ICD-10-CM | POA: Insufficient documentation

## 2011-10-05 DIAGNOSIS — I10 Essential (primary) hypertension: Secondary | ICD-10-CM | POA: Insufficient documentation

## 2011-10-05 DIAGNOSIS — R42 Dizziness and giddiness: Secondary | ICD-10-CM | POA: Insufficient documentation

## 2011-10-05 DIAGNOSIS — Z79899 Other long term (current) drug therapy: Secondary | ICD-10-CM | POA: Insufficient documentation

## 2011-10-05 DIAGNOSIS — R51 Headache: Secondary | ICD-10-CM

## 2011-10-05 DIAGNOSIS — E119 Type 2 diabetes mellitus without complications: Secondary | ICD-10-CM | POA: Insufficient documentation

## 2011-10-05 DIAGNOSIS — R5381 Other malaise: Secondary | ICD-10-CM | POA: Insufficient documentation

## 2011-10-05 DIAGNOSIS — F341 Dysthymic disorder: Secondary | ICD-10-CM | POA: Insufficient documentation

## 2011-10-05 DIAGNOSIS — R269 Unspecified abnormalities of gait and mobility: Secondary | ICD-10-CM | POA: Insufficient documentation

## 2011-10-05 DIAGNOSIS — H538 Other visual disturbances: Secondary | ICD-10-CM | POA: Insufficient documentation

## 2011-10-05 DIAGNOSIS — M129 Arthropathy, unspecified: Secondary | ICD-10-CM | POA: Insufficient documentation

## 2011-10-05 LAB — URINALYSIS, ROUTINE W REFLEX MICROSCOPIC
Bilirubin Urine: NEGATIVE
Glucose, UA: NEGATIVE mg/dL
Hgb urine dipstick: NEGATIVE
Ketones, ur: 15 mg/dL — AB
Nitrite: NEGATIVE
Protein, ur: NEGATIVE mg/dL
Specific Gravity, Urine: 1.016 (ref 1.005–1.030)
Urobilinogen, UA: 1 mg/dL (ref 0.0–1.0)
pH: 7.5 (ref 5.0–8.0)

## 2011-10-05 LAB — BASIC METABOLIC PANEL
BUN: 13 mg/dL (ref 6–23)
CO2: 26 mEq/L (ref 19–32)
Calcium: 10.1 mg/dL (ref 8.4–10.5)
Chloride: 96 mEq/L (ref 96–112)
Creatinine, Ser: 0.58 mg/dL (ref 0.50–1.10)
GFR calc Af Amer: >90 mL/min (ref >90)
GFR calc non Af Amer: >90 mL/min (ref >90)
Glucose, Bld: 91 mg/dL (ref 70–99)
Potassium: 3.6 mEq/L (ref 3.5–5.1)
Sodium: 133 mEq/L — ABNORMAL LOW (ref 135–145)

## 2011-10-05 LAB — CBC
HCT: 44.2 % (ref 36.0–46.0)
Hemoglobin: 15.1 g/dL — ABNORMAL HIGH (ref 12.0–15.0)
MCH: 28.2 pg (ref 26.0–34.0)
MCHC: 34.2 g/dL (ref 30.0–36.0)
MCV: 82.6 fL (ref 78.0–100.0)
Platelets: 170 10*3/uL (ref 150–400)
RBC: 5.35 MIL/uL — ABNORMAL HIGH (ref 3.87–5.11)
RDW: 14.2 % (ref 11.5–15.5)
WBC: 4.6 10*3/uL (ref 4.0–10.5)

## 2011-10-05 LAB — URINE MICROSCOPIC-ADD ON

## 2011-10-05 MED ORDER — ONDANSETRON HCL 4 MG/2ML IJ SOLN: 4.0000 mg | Freq: Once | INTRAMUSCULAR | Status: DC

## 2011-10-05 NOTE — ED Notes (Signed)
As above.

## 2011-10-05 NOTE — ED Notes (Signed)
Pt c/o intemittant HA and sent from [facility]; pt with some dizziness while driving and in general; pt sts some mild confusion x several months; pt c/o left eye blurry vision; pt sent for further eval

## 2011-10-05 NOTE — ED Notes (Signed)
EKG given to Dr. Oletta Lamas, copy placed in chart.

## 2011-10-05 NOTE — ED Provider Notes (Signed)
History     CSN: [id number] Arrival date & time: 10/05/2011  4:00 PM   First MD Initiated Contact with Patient 10/05/11 1602      Chief Complaint  Patient presents with  . Headache    (Consider location/radiation/quality/duration/timing/severity/associated sxs/prior treatment) Patient is a 65 y.o. female presenting with headaches. The history is provided by the patient.  Headache  This is a new problem. The current episode started more than 1 week ago. Episode frequency: daily. The problem has been gradually worsening. The headache is associated with nothing. The pain is located in the left unilateral region. The quality of the pain is described as dull. The pain is moderate. The pain does not radiate. Pertinent negatives include no anorexia, no fever, no malaise/fatigue, no chest pressure, no near-syncope, no palpitations, no syncope, no shortness of breath, no nausea and no vomiting. She has tried nothing for the symptoms.    Past Medical History  Diagnosis Date  . Anxiety   . Depression   . Cataract     had surgery for  . Diabetes mellitus   . Hypertension   . Arthritis     SHOULDER,KNEES,BACK    Past Surgical History  Procedure Date  . Cholecystectomy   . Colostomy   . Polypectomy   . Cataract extraction w/ intraocular lens  implant, bilateral     Family History  Problem Relation Age of Onset  . Stroke Mother   . Cancer Father   . Diabetes Sister   . Stroke Sister     History  Substance Use Topics  . Smoking status: Former Games developer  . Smokeless tobacco: Never Used  . Alcohol Use: No    OB History    Grav Para Term Preterm Abortions TAB SAB Ect Mult Living                  Review of Systems  Constitutional: Negative for fever, chills and malaise/fatigue.  HENT: Negative for neck pain and neck stiffness.   Eyes: Positive for visual disturbance (occasional blurry vision in the evening).  Respiratory: Negative for cough and shortness of breath.     Cardiovascular: Negative for chest pain, palpitations, syncope and near-syncope.  Gastrointestinal: Negative for nausea, vomiting, abdominal pain and anorexia.  Musculoskeletal: Positive for gait problem (occasionally unsteady). Negative for back pain.  Skin: Negative for color change and rash.  Neurological: Positive for dizziness ("off balance"), weakness and headaches. Negative for seizures, syncope, speech difficulty and light-headedness.  Psychiatric/Behavioral: Negative for confusion and decreased concentration.  All other systems reviewed and are negative.    Allergies  Review of patient's allergies indicates no known allergies.  Home Medications   Current Outpatient Rx  Name Route Sig Dispense Refill  . ASPIRIN 81 MG PO TABS Oral Take 81 mg by mouth daily.      . BUPROPION HCL ER (SR) 200 MG PO TB12 Oral Take 200 mg by mouth 2 (two) times daily.      Marland Kitchen CALCIUM CITRATE-VITAMIN D 200-200 MG-UNIT PO TABS Oral Take 1 tablet by mouth daily.      Marland Kitchen CITALOPRAM HYDROBROMIDE 20 MG PO TABS Oral Take 20 mg by mouth daily.      . OMEGA-3 FATTY ACIDS 1000 MG PO CAPS Oral Take 1,200 mg by mouth daily.      . MULTI-VITAMIN/MINERALS PO TABS Oral Take 1 tablet by mouth daily.      Marland Kitchen PIOGLITAZONE HCL-METFORMIN HCL 15-500 MG PO TABS Oral Take 1 tablet by mouth  daily after breakfast.      . TRAMADOL-ACETAMINOPHEN 37.5-325 MG PO TABS Oral Take 1 tablet by mouth 2 (two) times daily as needed. For pain    . VALSARTAN-HYDROCHLOROTHIAZIDE 160-25 MG PO TABS Oral Take 1 tablet by mouth daily.      Marland Kitchen VITAMIN B-12 1000 MCG PO TABS Oral Take 1,000 mcg by mouth daily.        BP 157/89  Pulse 57  Temp(Src) 97.7 F (36.5 C) (Oral)  Resp 18  SpO2 95%  Physical Exam  Nursing note and vitals reviewed. Constitutional: She is oriented to person, place, and time. She appears well-developed and well-nourished.  HENT:  Head: Normocephalic and atraumatic.  Eyes: EOM are normal. Right pupil is reactive. Left  pupil is not round (s/p cataract surgery with irregularly shaped pupil). Left pupil is reactive.  Cardiovascular: Normal rate, regular rhythm, normal heart sounds and intact distal pulses.   Pulmonary/Chest: Effort normal and breath sounds normal. No respiratory distress.  Abdominal: Soft. She exhibits no distension. There is no tenderness.  Neurological: She is alert and oriented to person, place, and time. No cranial nerve deficit or sensory deficit. Gait abnormal. GCS eye subscore is 4. GCS verbal subscore is 5. GCS motor subscore is 6.       Positive Romberg, mild weakness, but no focal abnormalities. Bilateral vision 20/40, R eye 20/30, L eye 20/40.  Skin: Skin is warm and dry.  Psychiatric: She has a normal mood and affect.    ED Course  Procedures (including critical care time)  Labs Reviewed  CBC - Abnormal; Notable for the following:    RBC 5.35 (*)    Hemoglobin 15.1 (*)    All other components within normal limits  BASIC METABOLIC PANEL - Abnormal; Notable for the following:    Sodium 133 (*)    All other components within normal limits  URINALYSIS, ROUTINE W REFLEX MICROSCOPIC - Abnormal; Notable for the following:    Ketones, ur 15 (*)    Leukocytes, UA MODERATE (*)    All other components within normal limits  URINE MICROSCOPIC-ADD ON - Abnormal; Notable for the following:    Squamous Epithelial / LPF FEW (*)    All other components within normal limits  URINE CULTURE   Ct Head Wo Contrast  10/05/2011  *RADIOLOGY REPORT*  Clinical Data: Headache and dizziness.  CT HEAD WITHOUT CONTRAST  Technique:  Contiguous axial images were obtained from the base of the skull through the vertex without contrast.  Comparison: None  Findings: The ventricles are normal for age.  No extra-axial fluid collections are seen.  There is periventricular white matter disease which is likely microvascular ischemic change.  No CT findings for hemispheric infarction or intracranial hemorrhage.  No  mass lesions.  The brainstem and cerebellum are normal.  The calvarium is intact.  The paranasal sinuses and mastoid air cells are clear.  IMPRESSION:  1.  No acute intracranial findings. 2.  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  Original Report Authenticated By: P. Loralie Champagne, M.D.     1. Headache       MDM  A 65 year old female who presents with 2-3 months of gradually progressive headaches. States it started on the left side, and has been getting progressively worse in frequency, as well as associated symptoms periods as has recently woken her up from sleep several times; she says that the intensity of the headaches have been waxing and waning throughout the day. She  complains of feeling dizzy and off balance, and has fallen several times. She has had several episodes of feeling like she was about to pass out, but has not actually done so. States that she has experienced blurry vision, especially worse at night, and occasional associated difficulties driving due to feeling dizzy. She overall feels weak. She also notices some pain in the left side of her neck, but does not have any neck stiffness. She says that she has been needing to urinate much more frequently, but denies any dysuria, no loss of control of her urine, or bowels. She denies any chest pain, confusion, palpitations, shortness of breath, abdominal pain, or any other symptoms. Exam is remarkable for generalized weakness, difficulties ambulating due to unsteady gait, Romberg is positive; she is noted to have mild generalized weakness, but no focal deficits.  Labs unremarkable. No signs of acute abnormality seen on CT. Patient reports significant improvement of her headache at this point in time, and is able to ambulate with significantly less difficulties, and improved gait. Discussed with patient continue treatment at home, followup with her PCP, as well as with neurology for her persistent  headaches, indications for return should her symptoms change or worsen. Also discussed the importance of avoiding driving while continuing to have headaches and being off balance. Patient expresses understanding with this plan.      Theotis Burrow, MD 10/06/11 [phone]

## 2011-10-05 NOTE — ED Notes (Signed)
Pt mistakenly given Tramadol 100mg  po for pain--PA and Charge RN notified of mistake.

## 2011-10-05 NOTE — ED Notes (Signed)
Pt was unable to give a urine sample at this time. Pt states had just went to the restroom a while ago. Will try again in a few minutes.

## 2011-10-05 NOTE — ED Provider Notes (Signed)
  I performed a history and physical examination of Carol Rubio and discussed her management with Dr. Leary Roca.  I agree with the history, physical, assessment, and plan of care, with the following exceptions: None  I was present for the following procedures: None Time Spent in Critical Care of the patient: None  Pt's gait has since improved.  HA seems improved, head CT shows no acute abn's.  I reviewed the head CT here.  No hemorrhage, mass effect, tumor seen.  Pt will need follow up with PCP and possibly referral to neurologist.     Carol Ng.    Carol Pound. [name], MD 10/08/11 [phone]

## 2011-10-05 NOTE — ED Provider Notes (Signed)
History     CSN: [id number] Arrival date & time: 10/05/2011  1:21 PM   First MD Initiated Contact with Patient 10/05/11 1346      Chief Complaint  Patient presents with  . Headache    c/o headache for 2 months.  patient reports she has headache every day, headache wakes her up from sleep per patient.  has not seen her physician for this complaint. patient describes various portions of head that hurt, no specific area.  patient reports off balance.  reports room spinning.  reports 3 falls in last 6 months    (Consider location/radiation/quality/duration/timing/severity/associated sxs/prior treatment) HPI Comments: [place]ia presents for evaluation of persistent headaches, mostly on her LEFT side, intermittent dizziness, and several falls over the last 2 months. She reports some confusion over the last 6 months. She also reports visual changes, with blurred vision in her LEFT eye. She reports getting dizzy spells even while she is driving, where she has to pull over at times. She most recently fell down her steps 2 weeks ago. She reports being dizzy at the time.   Patient is a 65 y.o. female presenting with neurologic complaint.  Neurologic Problem The primary symptoms include headaches, syncope, loss of consciousness, dizziness, visual change and memory loss. The symptoms began more than 1 week ago. The symptoms are worsening.  Location/region(s) of the headache: left unilateral. The headache is associated with visual change and weakness. The headache is not associated with neck stiffness.  There was loss of consciousness. Before the onset of the syncopal episode there was visual change, dizziness and weakness. The syncopal episode occurred with shortness of breath and headaches. There is history of hypertension and diabetes.  Dizziness also occurs with weakness.  Additional symptoms include weakness. Additional symptoms do not include neck stiffness. Medical issues also include diabetes and  hypertension.    Past Medical History  Diagnosis Date  . Anxiety   . Depression   . Cataract     had surgery for  . Diabetes mellitus   . Hypertension   . Arthritis     SHOULDER,KNEES,BACK    Past Surgical History  Procedure Date  . Cholecystectomy   . Colostomy   . Polypectomy   . Cataract extraction w/ intraocular lens  implant, bilateral     Family History  Problem Relation Age of Onset  . Stroke Mother   . Cancer Father   . Diabetes Sister   . Stroke Sister     History  Substance Use Topics  . Smoking status: Former Games developer  . Smokeless tobacco: Never Used  . Alcohol Use: No    OB History    Grav Para Term Preterm Abortions TAB SAB Ect Mult Living                  Review of Systems  HENT: Positive for neck pain. Negative for neck stiffness.   Eyes: Positive for visual disturbance.  Respiratory: Positive for shortness of breath.   Cardiovascular: Positive for syncope.  Gastrointestinal: Negative.   Genitourinary: Negative.   Musculoskeletal: Positive for gait problem.  Skin: Negative.   Neurological: Positive for dizziness, loss of consciousness, syncope, weakness, light-headedness and headaches.  Psychiatric/Behavioral: Positive for memory loss and decreased concentration.    Allergies  Review of patient's allergies indicates no known allergies.  Home Medications   Current Outpatient Rx  Name Route Sig Dispense Refill  . ASPIRIN 81 MG PO TABS Oral Take 81 mg by mouth daily.      Marland Kitchen  BUPROPION HCL ER (SR) 200 MG PO TB12 Oral Take 200 mg by mouth 2 (two) times daily.      Marland Kitchen CALCIUM CITRATE-VITAMIN D 200-200 MG-UNIT PO TABS Oral Take 1 tablet by mouth daily.      Marland Kitchen CITALOPRAM HYDROBROMIDE 20 MG PO TABS Oral Take 20 mg by mouth daily.      . OMEGA-3 FATTY ACIDS 1000 MG PO CAPS Oral Take 1,200 mg by mouth daily.      . MULTI-VITAMIN/MINERALS PO TABS Oral Take 1 tablet by mouth daily.      Marland Kitchen PIOGLITAZONE HCL-METFORMIN HCL 15-500 MG PO TABS Oral Take 1  tablet by mouth daily after breakfast.      . TRAMADOL-ACETAMINOPHEN 37.5-325 MG PO TABS Oral Take 1 tablet by mouth daily. Takes it daily with occasional twice a day     . VALSARTAN-HYDROCHLOROTHIAZIDE 160-25 MG PO TABS Oral Take 1 tablet by mouth daily.      Marland Kitchen VITAMIN B-12 1000 MCG PO TABS Oral Take 1,000 mcg by mouth daily.        BP 152/84  Pulse 62  Temp(Src) 98.1 F (36.7 C) (Oral)  Resp 20  SpO2 100%  Physical Exam  Constitutional: She is oriented to person, place, and time. She appears well-developed and well-nourished.  HENT:  Head: Normocephalic and atraumatic.  Eyes: Conjunctivae, EOM and lids are normal. Left pupil is not round and not reactive. Pupils are unequal.    Neck: Normal range of motion. Neck supple.  Cardiovascular: Normal rate and regular rhythm.   Pulmonary/Chest: Effort normal and breath sounds normal.  Musculoskeletal: Normal range of motion.  Neurological: She is alert and oriented to person, place, and time. She has normal strength. Coordination abnormal.       Positive Romberg; unsteady while standing  Skin: Skin is warm and dry.    ED Course  Procedures (including critical care time)  Labs Reviewed - No data to display No results found.   No diagnosis found.    MDM  Will transfer to [facility] ED for further neurological evaluation.        Richardo Priest, MD 10/05/11 [phone]

## 2011-10-08 NOTE — ED Provider Notes (Signed)
Please see my addendum note  Gavin Pound. Oletta Lamas, MD 10/08/11 [phone]

## 2011-11-20 DIAGNOSIS — R5381 Other malaise: Secondary | ICD-10-CM | POA: Diagnosis not present

## 2011-11-20 DIAGNOSIS — Z79899 Other long term (current) drug therapy: Secondary | ICD-10-CM | POA: Diagnosis not present

## 2011-11-20 DIAGNOSIS — R5383 Other fatigue: Secondary | ICD-10-CM | POA: Diagnosis not present

## 2011-11-20 DIAGNOSIS — B1710 Acute hepatitis C without hepatic coma: Secondary | ICD-10-CM | POA: Diagnosis not present

## 2011-11-20 DIAGNOSIS — E119 Type 2 diabetes mellitus without complications: Secondary | ICD-10-CM | POA: Diagnosis not present

## 2011-11-20 DIAGNOSIS — Z7901 Long term (current) use of anticoagulants: Secondary | ICD-10-CM | POA: Diagnosis not present

## 2011-11-20 DIAGNOSIS — I10 Essential (primary) hypertension: Secondary | ICD-10-CM | POA: Diagnosis not present

## 2011-11-27 ENCOUNTER — Other Ambulatory Visit (HOSPITAL_COMMUNITY): Payer: Self-pay | Admitting: Family Medicine

## 2011-11-27 DIAGNOSIS — R7989 Other specified abnormal findings of blood chemistry: Secondary | ICD-10-CM

## 2011-11-27 DIAGNOSIS — B1920 Unspecified viral hepatitis C without hepatic coma: Secondary | ICD-10-CM

## 2011-11-30 ENCOUNTER — Other Ambulatory Visit (HOSPITAL_COMMUNITY): Payer: Self-pay | Admitting: Physician Assistant

## 2011-11-30 DIAGNOSIS — Z1231 Encounter for screening mammogram for malignant neoplasm of breast: Secondary | ICD-10-CM

## 2011-12-01 ENCOUNTER — Ambulatory Visit (HOSPITAL_COMMUNITY)
Admission: RE | Admit: 2011-12-01 | Discharge: 2011-12-01 | Disposition: A | Discharge status: Home / Self Care | Payer: Medicare Other | Source: Ambulatory Visit | Attending: Family Medicine | Admitting: Family Medicine

## 2011-12-01 DIAGNOSIS — R7989 Other specified abnormal findings of blood chemistry: Secondary | ICD-10-CM | POA: Diagnosis not present

## 2011-12-01 DIAGNOSIS — B1920 Unspecified viral hepatitis C without hepatic coma: Secondary | ICD-10-CM

## 2012-01-08 ENCOUNTER — Ambulatory Visit (HOSPITAL_COMMUNITY)
Admission: RE | Admit: 2012-01-08 | Discharge: 2012-01-08 | Disposition: A | Discharge status: Home / Self Care | Payer: Medicare Other | Source: Ambulatory Visit | Attending: Physician Assistant | Admitting: Physician Assistant

## 2012-01-08 DIAGNOSIS — Z1231 Encounter for screening mammogram for malignant neoplasm of breast: Secondary | ICD-10-CM | POA: Insufficient documentation

## 2012-01-10 ENCOUNTER — Other Ambulatory Visit: Payer: Self-pay | Admitting: Physician Assistant

## 2012-01-10 DIAGNOSIS — R928 Other abnormal and inconclusive findings on diagnostic imaging of breast: Secondary | ICD-10-CM

## 2012-01-16 ENCOUNTER — Ambulatory Visit
Admission: RE | Admit: 2012-01-16 | Discharge: 2012-01-16 | Disposition: A | Discharge status: Home / Self Care | Payer: Medicare Other | Source: Ambulatory Visit | Attending: Physician Assistant | Admitting: Physician Assistant

## 2012-01-16 DIAGNOSIS — R928 Other abnormal and inconclusive findings on diagnostic imaging of breast: Secondary | ICD-10-CM

## 2012-01-30 DIAGNOSIS — E119 Type 2 diabetes mellitus without complications: Secondary | ICD-10-CM | POA: Diagnosis not present

## 2012-01-30 DIAGNOSIS — R112 Nausea with vomiting, unspecified: Secondary | ICD-10-CM | POA: Diagnosis not present

## 2012-01-30 DIAGNOSIS — R17 Unspecified jaundice: Secondary | ICD-10-CM | POA: Diagnosis not present

## 2012-01-30 DIAGNOSIS — N390 Urinary tract infection, site not specified: Secondary | ICD-10-CM | POA: Diagnosis not present

## 2012-01-30 DIAGNOSIS — G44229 Chronic tension-type headache, not intractable: Secondary | ICD-10-CM | POA: Diagnosis not present

## 2012-01-30 DIAGNOSIS — R42 Dizziness and giddiness: Secondary | ICD-10-CM | POA: Diagnosis not present

## 2012-02-08 DIAGNOSIS — Z961 Presence of intraocular lens: Secondary | ICD-10-CM | POA: Diagnosis not present

## 2012-02-08 DIAGNOSIS — H04129 Dry eye syndrome of unspecified lacrimal gland: Secondary | ICD-10-CM | POA: Diagnosis not present

## 2012-03-28 ENCOUNTER — Ambulatory Visit: Payer: Medicare Other | Admitting: Gastroenterology

## 2012-04-09 DIAGNOSIS — F319 Bipolar disorder, unspecified: Secondary | ICD-10-CM | POA: Diagnosis not present

## 2012-04-12 DIAGNOSIS — IMO0001 Reserved for inherently not codable concepts without codable children: Secondary | ICD-10-CM | POA: Diagnosis not present

## 2012-04-12 DIAGNOSIS — R5383 Other fatigue: Secondary | ICD-10-CM | POA: Diagnosis not present

## 2012-04-12 DIAGNOSIS — I10 Essential (primary) hypertension: Secondary | ICD-10-CM | POA: Diagnosis not present

## 2012-04-12 DIAGNOSIS — M79609 Pain in unspecified limb: Secondary | ICD-10-CM | POA: Diagnosis not present

## 2012-04-12 DIAGNOSIS — G4700 Insomnia, unspecified: Secondary | ICD-10-CM | POA: Diagnosis not present

## 2012-04-12 DIAGNOSIS — R5381 Other malaise: Secondary | ICD-10-CM | POA: Diagnosis not present

## 2012-04-12 DIAGNOSIS — F329 Major depressive disorder, single episode, unspecified: Secondary | ICD-10-CM | POA: Diagnosis not present

## 2012-04-16 DIAGNOSIS — I10 Essential (primary) hypertension: Secondary | ICD-10-CM | POA: Diagnosis not present

## 2012-04-16 DIAGNOSIS — F329 Major depressive disorder, single episode, unspecified: Secondary | ICD-10-CM | POA: Diagnosis not present

## 2012-04-16 DIAGNOSIS — R5383 Other fatigue: Secondary | ICD-10-CM | POA: Diagnosis not present

## 2012-04-16 DIAGNOSIS — R5381 Other malaise: Secondary | ICD-10-CM | POA: Diagnosis not present

## 2012-04-16 DIAGNOSIS — E119 Type 2 diabetes mellitus without complications: Secondary | ICD-10-CM | POA: Diagnosis not present

## 2012-04-16 DIAGNOSIS — G4700 Insomnia, unspecified: Secondary | ICD-10-CM | POA: Diagnosis not present

## 2012-04-24 DIAGNOSIS — I10 Essential (primary) hypertension: Secondary | ICD-10-CM | POA: Diagnosis not present

## 2012-04-24 DIAGNOSIS — E119 Type 2 diabetes mellitus without complications: Secondary | ICD-10-CM | POA: Diagnosis not present

## 2012-04-25 DIAGNOSIS — E119 Type 2 diabetes mellitus without complications: Secondary | ICD-10-CM | POA: Diagnosis not present

## 2012-04-25 DIAGNOSIS — I10 Essential (primary) hypertension: Secondary | ICD-10-CM | POA: Diagnosis not present

## 2012-04-25 DIAGNOSIS — R413 Other amnesia: Secondary | ICD-10-CM | POA: Diagnosis not present

## 2012-04-25 DIAGNOSIS — K759 Inflammatory liver disease, unspecified: Secondary | ICD-10-CM | POA: Diagnosis not present

## 2012-04-26 ENCOUNTER — Other Ambulatory Visit: Payer: Self-pay | Admitting: Neurology

## 2012-04-26 DIAGNOSIS — E119 Type 2 diabetes mellitus without complications: Secondary | ICD-10-CM

## 2012-04-26 DIAGNOSIS — I10 Essential (primary) hypertension: Secondary | ICD-10-CM

## 2012-05-03 ENCOUNTER — Ambulatory Visit
Admission: RE | Admit: 2012-05-03 | Discharge: 2012-05-03 | Disposition: A | Discharge status: Home / Self Care | Payer: Medicare Other | Source: Ambulatory Visit | Attending: Neurology | Admitting: Neurology

## 2012-05-03 DIAGNOSIS — R269 Unspecified abnormalities of gait and mobility: Secondary | ICD-10-CM | POA: Diagnosis not present

## 2012-05-03 DIAGNOSIS — I10 Essential (primary) hypertension: Secondary | ICD-10-CM | POA: Diagnosis not present

## 2012-05-03 DIAGNOSIS — E119 Type 2 diabetes mellitus without complications: Secondary | ICD-10-CM

## 2012-05-14 DIAGNOSIS — F410 Panic disorder [episodic paroxysmal anxiety] without agoraphobia: Secondary | ICD-10-CM | POA: Diagnosis not present

## 2012-05-14 DIAGNOSIS — I10 Essential (primary) hypertension: Secondary | ICD-10-CM | POA: Diagnosis not present

## 2012-05-14 DIAGNOSIS — G4700 Insomnia, unspecified: Secondary | ICD-10-CM | POA: Diagnosis not present

## 2012-05-14 DIAGNOSIS — IMO0001 Reserved for inherently not codable concepts without codable children: Secondary | ICD-10-CM | POA: Diagnosis not present

## 2012-05-27 DIAGNOSIS — R413 Other amnesia: Secondary | ICD-10-CM | POA: Diagnosis not present

## 2012-05-27 DIAGNOSIS — I10 Essential (primary) hypertension: Secondary | ICD-10-CM | POA: Diagnosis not present

## 2012-05-27 DIAGNOSIS — E119 Type 2 diabetes mellitus without complications: Secondary | ICD-10-CM | POA: Diagnosis not present

## 2012-06-10 ENCOUNTER — Ambulatory Visit (HOSPITAL_COMMUNITY): Payer: Medicare Other | Attending: Internal Medicine | Admitting: Radiology

## 2012-06-10 ENCOUNTER — Other Ambulatory Visit (HOSPITAL_COMMUNITY): Payer: Self-pay | Admitting: Neurology

## 2012-06-10 DIAGNOSIS — E669 Obesity, unspecified: Secondary | ICD-10-CM | POA: Insufficient documentation

## 2012-06-10 DIAGNOSIS — I517 Cardiomegaly: Secondary | ICD-10-CM | POA: Diagnosis not present

## 2012-06-10 DIAGNOSIS — R413 Other amnesia: Secondary | ICD-10-CM | POA: Insufficient documentation

## 2012-06-10 DIAGNOSIS — F1911 Other psychoactive substance abuse, in remission: Secondary | ICD-10-CM | POA: Insufficient documentation

## 2012-06-10 DIAGNOSIS — F1011 Alcohol abuse, in remission: Secondary | ICD-10-CM | POA: Insufficient documentation

## 2012-06-10 DIAGNOSIS — E119 Type 2 diabetes mellitus without complications: Secondary | ICD-10-CM | POA: Insufficient documentation

## 2012-06-10 DIAGNOSIS — I10 Essential (primary) hypertension: Secondary | ICD-10-CM | POA: Insufficient documentation

## 2012-06-10 DIAGNOSIS — Z87891 Personal history of nicotine dependence: Secondary | ICD-10-CM | POA: Insufficient documentation

## 2012-06-10 DIAGNOSIS — B1920 Unspecified viral hepatitis C without hepatic coma: Secondary | ICD-10-CM | POA: Insufficient documentation

## 2012-06-10 NOTE — Progress Notes (Signed)
Echocardiogram performed.  

## 2012-06-11 DIAGNOSIS — R5381 Other malaise: Secondary | ICD-10-CM | POA: Diagnosis not present

## 2012-06-11 DIAGNOSIS — I10 Essential (primary) hypertension: Secondary | ICD-10-CM | POA: Diagnosis not present

## 2012-06-11 DIAGNOSIS — R5383 Other fatigue: Secondary | ICD-10-CM | POA: Diagnosis not present

## 2012-06-11 DIAGNOSIS — J0100 Acute maxillary sinusitis, unspecified: Secondary | ICD-10-CM | POA: Diagnosis not present

## 2012-06-11 DIAGNOSIS — R7402 Elevation of levels of lactic acid dehydrogenase (LDH): Secondary | ICD-10-CM | POA: Diagnosis not present

## 2012-06-14 ENCOUNTER — Encounter (HOSPITAL_COMMUNITY): Payer: Self-pay | Admitting: Neurology

## 2012-07-10 DIAGNOSIS — I10 Essential (primary) hypertension: Secondary | ICD-10-CM | POA: Diagnosis not present

## 2012-07-10 DIAGNOSIS — M545 Low back pain, unspecified: Secondary | ICD-10-CM | POA: Diagnosis not present

## 2012-07-10 DIAGNOSIS — J209 Acute bronchitis, unspecified: Secondary | ICD-10-CM | POA: Diagnosis not present

## 2012-07-10 DIAGNOSIS — R0989 Other specified symptoms and signs involving the circulatory and respiratory systems: Secondary | ICD-10-CM | POA: Diagnosis not present

## 2012-07-10 DIAGNOSIS — E119 Type 2 diabetes mellitus without complications: Secondary | ICD-10-CM | POA: Diagnosis not present

## 2012-08-01 DIAGNOSIS — Z23 Encounter for immunization: Secondary | ICD-10-CM | POA: Diagnosis not present

## 2012-08-06 DIAGNOSIS — E119 Type 2 diabetes mellitus without complications: Secondary | ICD-10-CM | POA: Diagnosis not present

## 2012-08-06 DIAGNOSIS — J45909 Unspecified asthma, uncomplicated: Secondary | ICD-10-CM | POA: Diagnosis not present

## 2012-08-06 DIAGNOSIS — F411 Generalized anxiety disorder: Secondary | ICD-10-CM | POA: Diagnosis not present

## 2012-08-06 DIAGNOSIS — M545 Low back pain, unspecified: Secondary | ICD-10-CM | POA: Diagnosis not present

## 2012-08-09 DIAGNOSIS — H04129 Dry eye syndrome of unspecified lacrimal gland: Secondary | ICD-10-CM | POA: Diagnosis not present

## 2012-08-09 DIAGNOSIS — H40059 Ocular hypertension, unspecified eye: Secondary | ICD-10-CM | POA: Diagnosis not present

## 2012-08-12 DIAGNOSIS — F411 Generalized anxiety disorder: Secondary | ICD-10-CM | POA: Diagnosis not present

## 2012-08-12 DIAGNOSIS — E119 Type 2 diabetes mellitus without complications: Secondary | ICD-10-CM | POA: Diagnosis not present

## 2012-08-12 DIAGNOSIS — I10 Essential (primary) hypertension: Secondary | ICD-10-CM | POA: Diagnosis not present

## 2012-08-12 DIAGNOSIS — J0100 Acute maxillary sinusitis, unspecified: Secondary | ICD-10-CM | POA: Diagnosis not present

## 2012-09-06 DIAGNOSIS — M545 Low back pain, unspecified: Secondary | ICD-10-CM | POA: Diagnosis not present

## 2012-09-06 DIAGNOSIS — E119 Type 2 diabetes mellitus without complications: Secondary | ICD-10-CM | POA: Diagnosis not present

## 2012-09-06 DIAGNOSIS — J45909 Unspecified asthma, uncomplicated: Secondary | ICD-10-CM | POA: Diagnosis not present

## 2012-09-06 DIAGNOSIS — F411 Generalized anxiety disorder: Secondary | ICD-10-CM | POA: Diagnosis not present

## 2012-09-11 DIAGNOSIS — H4011X0 Primary open-angle glaucoma, stage unspecified: Secondary | ICD-10-CM | POA: Diagnosis not present

## 2012-10-04 DIAGNOSIS — F411 Generalized anxiety disorder: Secondary | ICD-10-CM | POA: Diagnosis not present

## 2012-10-04 DIAGNOSIS — E119 Type 2 diabetes mellitus without complications: Secondary | ICD-10-CM | POA: Diagnosis not present

## 2012-10-04 DIAGNOSIS — M545 Low back pain, unspecified: Secondary | ICD-10-CM | POA: Diagnosis not present

## 2012-10-04 DIAGNOSIS — J45909 Unspecified asthma, uncomplicated: Secondary | ICD-10-CM | POA: Diagnosis not present

## 2012-10-14 DIAGNOSIS — E119 Type 2 diabetes mellitus without complications: Secondary | ICD-10-CM | POA: Diagnosis not present

## 2012-10-14 DIAGNOSIS — I10 Essential (primary) hypertension: Secondary | ICD-10-CM | POA: Diagnosis not present

## 2012-10-14 DIAGNOSIS — R413 Other amnesia: Secondary | ICD-10-CM | POA: Diagnosis not present

## 2012-11-08 DIAGNOSIS — J45909 Unspecified asthma, uncomplicated: Secondary | ICD-10-CM | POA: Diagnosis not present

## 2012-11-08 DIAGNOSIS — M545 Low back pain, unspecified: Secondary | ICD-10-CM | POA: Diagnosis not present

## 2012-11-08 DIAGNOSIS — E119 Type 2 diabetes mellitus without complications: Secondary | ICD-10-CM | POA: Diagnosis not present

## 2012-11-08 DIAGNOSIS — F411 Generalized anxiety disorder: Secondary | ICD-10-CM | POA: Diagnosis not present

## 2012-11-11 DIAGNOSIS — H4011X0 Primary open-angle glaucoma, stage unspecified: Secondary | ICD-10-CM | POA: Diagnosis not present

## 2012-11-11 DIAGNOSIS — H409 Unspecified glaucoma: Secondary | ICD-10-CM | POA: Diagnosis not present

## 2012-11-11 DIAGNOSIS — H16229 Keratoconjunctivitis sicca, not specified as Sjogren's, unspecified eye: Secondary | ICD-10-CM | POA: Diagnosis not present

## 2012-12-06 DIAGNOSIS — M545 Low back pain, unspecified: Secondary | ICD-10-CM | POA: Diagnosis not present

## 2012-12-06 DIAGNOSIS — J45909 Unspecified asthma, uncomplicated: Secondary | ICD-10-CM | POA: Diagnosis not present

## 2012-12-06 DIAGNOSIS — E119 Type 2 diabetes mellitus without complications: Secondary | ICD-10-CM | POA: Diagnosis not present

## 2012-12-06 DIAGNOSIS — R32 Unspecified urinary incontinence: Secondary | ICD-10-CM | POA: Diagnosis not present

## 2013-01-07 DIAGNOSIS — E119 Type 2 diabetes mellitus without complications: Secondary | ICD-10-CM | POA: Diagnosis not present

## 2013-01-07 DIAGNOSIS — M79609 Pain in unspecified limb: Secondary | ICD-10-CM | POA: Diagnosis not present

## 2013-01-07 DIAGNOSIS — M545 Low back pain, unspecified: Secondary | ICD-10-CM | POA: Diagnosis not present

## 2013-01-07 DIAGNOSIS — J209 Acute bronchitis, unspecified: Secondary | ICD-10-CM | POA: Diagnosis not present

## 2013-01-16 ENCOUNTER — Other Ambulatory Visit: Payer: Self-pay | Admitting: Radiology

## 2013-01-20 DIAGNOSIS — I10 Essential (primary) hypertension: Secondary | ICD-10-CM | POA: Diagnosis not present

## 2013-01-20 DIAGNOSIS — F411 Generalized anxiety disorder: Secondary | ICD-10-CM | POA: Diagnosis not present

## 2013-01-20 DIAGNOSIS — E119 Type 2 diabetes mellitus without complications: Secondary | ICD-10-CM | POA: Diagnosis not present

## 2013-01-24 ENCOUNTER — Other Ambulatory Visit (HOSPITAL_COMMUNITY): Payer: Self-pay | Admitting: Internal Medicine

## 2013-01-24 DIAGNOSIS — Z803 Family history of malignant neoplasm of breast: Secondary | ICD-10-CM

## 2013-01-30 ENCOUNTER — Ambulatory Visit (HOSPITAL_COMMUNITY)
Admission: RE | Admit: 2013-01-30 | Discharge: 2013-01-30 | Disposition: A | Discharge status: Home / Self Care | Payer: Medicare Other | Source: Ambulatory Visit | Attending: Internal Medicine | Admitting: Internal Medicine

## 2013-01-30 DIAGNOSIS — Z803 Family history of malignant neoplasm of breast: Secondary | ICD-10-CM

## 2013-01-30 DIAGNOSIS — Z1231 Encounter for screening mammogram for malignant neoplasm of breast: Secondary | ICD-10-CM | POA: Diagnosis not present

## 2013-02-17 DIAGNOSIS — I10 Essential (primary) hypertension: Secondary | ICD-10-CM | POA: Diagnosis not present

## 2013-02-17 DIAGNOSIS — J209 Acute bronchitis, unspecified: Secondary | ICD-10-CM | POA: Diagnosis not present

## 2013-02-17 DIAGNOSIS — E119 Type 2 diabetes mellitus without complications: Secondary | ICD-10-CM | POA: Diagnosis not present

## 2013-02-17 DIAGNOSIS — F411 Generalized anxiety disorder: Secondary | ICD-10-CM | POA: Diagnosis not present

## 2013-03-10 ENCOUNTER — Telehealth: Payer: Self-pay | Admitting: Nurse Practitioner

## 2013-03-12 ENCOUNTER — Encounter: Payer: Self-pay | Admitting: *Deleted

## 2013-03-12 DIAGNOSIS — H4011X0 Primary open-angle glaucoma, stage unspecified: Secondary | ICD-10-CM | POA: Diagnosis not present

## 2013-03-17 NOTE — Telephone Encounter (Signed)
Patient is calling back because she received a letter stating to call the office back and r/s appointment for 04-14-13.

## 2013-03-19 DIAGNOSIS — I10 Essential (primary) hypertension: Secondary | ICD-10-CM | POA: Diagnosis not present

## 2013-03-19 DIAGNOSIS — F411 Generalized anxiety disorder: Secondary | ICD-10-CM | POA: Diagnosis not present

## 2013-03-19 DIAGNOSIS — M545 Low back pain, unspecified: Secondary | ICD-10-CM | POA: Diagnosis not present

## 2013-03-19 DIAGNOSIS — E119 Type 2 diabetes mellitus without complications: Secondary | ICD-10-CM | POA: Diagnosis not present

## 2013-04-14 ENCOUNTER — Ambulatory Visit: Payer: Self-pay | Admitting: Nurse Practitioner

## 2013-04-28 ENCOUNTER — Ambulatory Visit: Payer: Self-pay | Admitting: Nurse Practitioner

## 2013-05-07 DIAGNOSIS — I10 Essential (primary) hypertension: Secondary | ICD-10-CM | POA: Diagnosis not present

## 2013-05-07 DIAGNOSIS — M25519 Pain in unspecified shoulder: Secondary | ICD-10-CM | POA: Diagnosis not present

## 2013-05-07 DIAGNOSIS — E119 Type 2 diabetes mellitus without complications: Secondary | ICD-10-CM | POA: Diagnosis not present

## 2013-05-07 DIAGNOSIS — N390 Urinary tract infection, site not specified: Secondary | ICD-10-CM | POA: Diagnosis not present

## 2013-05-13 DIAGNOSIS — H4011X0 Primary open-angle glaucoma, stage unspecified: Secondary | ICD-10-CM | POA: Diagnosis not present

## 2013-06-11 DIAGNOSIS — R059 Cough, unspecified: Secondary | ICD-10-CM | POA: Diagnosis not present

## 2013-06-11 DIAGNOSIS — R7611 Nonspecific reaction to tuberculin skin test without active tuberculosis: Secondary | ICD-10-CM | POA: Diagnosis not present

## 2013-06-11 DIAGNOSIS — R634 Abnormal weight loss: Secondary | ICD-10-CM | POA: Diagnosis not present

## 2013-06-11 DIAGNOSIS — R05 Cough: Secondary | ICD-10-CM | POA: Diagnosis not present

## 2013-06-16 ENCOUNTER — Other Ambulatory Visit: Payer: Self-pay | Admitting: Infectious Diseases

## 2013-06-16 ENCOUNTER — Ambulatory Visit
Admission: RE | Admit: 2013-06-16 | Discharge: 2013-06-16 | Disposition: A | Discharge status: Home / Self Care | Payer: No Typology Code available for payment source | Source: Ambulatory Visit | Attending: Infectious Diseases | Admitting: Infectious Diseases

## 2013-06-16 DIAGNOSIS — R059 Cough, unspecified: Secondary | ICD-10-CM

## 2013-06-16 DIAGNOSIS — R634 Abnormal weight loss: Secondary | ICD-10-CM

## 2013-06-16 DIAGNOSIS — R05 Cough: Secondary | ICD-10-CM

## 2013-06-25 DIAGNOSIS — J45909 Unspecified asthma, uncomplicated: Secondary | ICD-10-CM | POA: Diagnosis not present

## 2013-06-25 DIAGNOSIS — R634 Abnormal weight loss: Secondary | ICD-10-CM | POA: Diagnosis not present

## 2013-06-25 DIAGNOSIS — R5381 Other malaise: Secondary | ICD-10-CM | POA: Diagnosis not present

## 2013-06-25 DIAGNOSIS — R5383 Other fatigue: Secondary | ICD-10-CM | POA: Diagnosis not present

## 2013-06-25 DIAGNOSIS — R05 Cough: Secondary | ICD-10-CM | POA: Diagnosis not present

## 2013-06-25 DIAGNOSIS — R059 Cough, unspecified: Secondary | ICD-10-CM | POA: Diagnosis not present

## 2013-07-10 DIAGNOSIS — R5381 Other malaise: Secondary | ICD-10-CM | POA: Diagnosis not present

## 2013-07-10 DIAGNOSIS — J189 Pneumonia, unspecified organism: Secondary | ICD-10-CM | POA: Diagnosis not present

## 2013-07-10 DIAGNOSIS — R634 Abnormal weight loss: Secondary | ICD-10-CM | POA: Diagnosis not present

## 2013-07-14 DIAGNOSIS — H16229 Keratoconjunctivitis sicca, not specified as Sjogren's, unspecified eye: Secondary | ICD-10-CM | POA: Diagnosis not present

## 2013-07-14 DIAGNOSIS — H4011X0 Primary open-angle glaucoma, stage unspecified: Secondary | ICD-10-CM | POA: Diagnosis not present

## 2013-07-14 DIAGNOSIS — E1139 Type 2 diabetes mellitus with other diabetic ophthalmic complication: Secondary | ICD-10-CM | POA: Diagnosis not present

## 2013-07-21 ENCOUNTER — Other Ambulatory Visit: Payer: Self-pay | Admitting: Gastroenterology

## 2013-07-21 DIAGNOSIS — R634 Abnormal weight loss: Secondary | ICD-10-CM | POA: Diagnosis not present

## 2013-07-21 DIAGNOSIS — R7402 Elevation of levels of lactic acid dehydrogenase (LDH): Secondary | ICD-10-CM | POA: Diagnosis not present

## 2013-07-21 DIAGNOSIS — R7989 Other specified abnormal findings of blood chemistry: Secondary | ICD-10-CM

## 2013-07-21 DIAGNOSIS — R1310 Dysphagia, unspecified: Secondary | ICD-10-CM | POA: Diagnosis not present

## 2013-07-21 DIAGNOSIS — D698 Other specified hemorrhagic conditions: Secondary | ICD-10-CM | POA: Diagnosis not present

## 2013-07-25 ENCOUNTER — Ambulatory Visit
Admission: RE | Admit: 2013-07-25 | Discharge: 2013-07-25 | Disposition: A | Discharge status: Home / Self Care | Payer: Medicare PPO | Source: Ambulatory Visit | Attending: Gastroenterology | Admitting: Gastroenterology

## 2013-07-25 DIAGNOSIS — R634 Abnormal weight loss: Secondary | ICD-10-CM

## 2013-07-25 DIAGNOSIS — R7989 Other specified abnormal findings of blood chemistry: Secondary | ICD-10-CM

## 2013-07-31 DIAGNOSIS — Z23 Encounter for immunization: Secondary | ICD-10-CM | POA: Diagnosis not present

## 2013-08-26 DIAGNOSIS — E119 Type 2 diabetes mellitus without complications: Secondary | ICD-10-CM | POA: Diagnosis not present

## 2013-08-26 DIAGNOSIS — M542 Cervicalgia: Secondary | ICD-10-CM | POA: Diagnosis not present

## 2013-08-26 DIAGNOSIS — E785 Hyperlipidemia, unspecified: Secondary | ICD-10-CM | POA: Diagnosis not present

## 2013-08-26 DIAGNOSIS — I10 Essential (primary) hypertension: Secondary | ICD-10-CM | POA: Diagnosis not present

## 2013-10-06 DIAGNOSIS — F329 Major depressive disorder, single episode, unspecified: Secondary | ICD-10-CM | POA: Diagnosis not present

## 2013-10-06 DIAGNOSIS — F331 Major depressive disorder, recurrent, moderate: Secondary | ICD-10-CM | POA: Diagnosis not present

## 2013-11-12 DIAGNOSIS — E782 Mixed hyperlipidemia: Secondary | ICD-10-CM | POA: Diagnosis not present

## 2013-11-12 DIAGNOSIS — E119 Type 2 diabetes mellitus without complications: Secondary | ICD-10-CM | POA: Diagnosis not present

## 2013-11-12 DIAGNOSIS — I10 Essential (primary) hypertension: Secondary | ICD-10-CM | POA: Diagnosis not present

## 2013-11-12 DIAGNOSIS — F5102 Adjustment insomnia: Secondary | ICD-10-CM | POA: Diagnosis not present

## 2013-11-19 DIAGNOSIS — H04129 Dry eye syndrome of unspecified lacrimal gland: Secondary | ICD-10-CM | POA: Diagnosis not present

## 2013-11-19 DIAGNOSIS — H4011X0 Primary open-angle glaucoma, stage unspecified: Secondary | ICD-10-CM | POA: Diagnosis not present

## 2013-11-27 DIAGNOSIS — F3189 Other bipolar disorder: Secondary | ICD-10-CM | POA: Diagnosis not present

## 2014-01-21 DIAGNOSIS — I10 Essential (primary) hypertension: Secondary | ICD-10-CM | POA: Diagnosis not present

## 2014-01-21 DIAGNOSIS — E119 Type 2 diabetes mellitus without complications: Secondary | ICD-10-CM | POA: Diagnosis not present

## 2014-01-29 DIAGNOSIS — F3189 Other bipolar disorder: Secondary | ICD-10-CM | POA: Diagnosis not present

## 2014-03-25 ENCOUNTER — Emergency Department (INDEPENDENT_AMBULATORY_CARE_PROVIDER_SITE_OTHER)
Admission: EM | Admit: 2014-03-25 | Discharge: 2014-03-25 | Disposition: A | Discharge status: Home / Self Care | Payer: Medicare PPO | Source: Home / Self Care

## 2014-03-25 ENCOUNTER — Encounter (HOSPITAL_COMMUNITY): Payer: Self-pay | Admitting: Emergency Medicine

## 2014-03-25 DIAGNOSIS — E119 Type 2 diabetes mellitus without complications: Secondary | ICD-10-CM

## 2014-03-25 DIAGNOSIS — R609 Edema, unspecified: Secondary | ICD-10-CM

## 2014-03-25 DIAGNOSIS — R600 Localized edema: Secondary | ICD-10-CM

## 2014-03-25 DIAGNOSIS — I10 Essential (primary) hypertension: Secondary | ICD-10-CM

## 2014-03-25 LAB — POCT URINALYSIS DIP (DEVICE)
Bilirubin Urine: NEGATIVE
Glucose, UA: NEGATIVE mg/dL
Ketones, ur: NEGATIVE mg/dL
Leukocytes, UA: NEGATIVE
Nitrite: NEGATIVE
Protein, ur: NEGATIVE mg/dL
Specific Gravity, Urine: 1.015 (ref 1.005–1.030)
Urobilinogen, UA: 1 mg/dL (ref 0.0–1.0)
pH: 7 (ref 5.0–8.0)

## 2014-03-25 NOTE — ED Notes (Addendum)
Patient here w her brother. Concern for bilateral lower extremity edema, ? Medication duplications (multiple medications , some w 3 bottles of same medications) NAD, anxious. Bilateral pitting edema both legs

## 2014-03-25 NOTE — ED Provider Notes (Signed)
CSN: [id number]     Arrival date & time 03/25/14  8921 History   First MD Initiated Contact with Patient 03/25/14 1134     Chief Complaint  Patient presents with  . Leg Swelling   (Consider location/radiation/quality/duration/timing/severity/associated sxs/prior Treatment) HPI Comments: 68  -year-old female is accompanied by her brother with a complaint of lower extremity edema for over 2 weeks. She has a history of hypertension and diabetes. One of her current medications his amlodipine. She denies pain in the lower extremities. She is concerned about the edema. Both lower extremities below the knee are edematous with left greater than right. There is no calf pain. There is no worsening today.   Past Medical History  Diagnosis Date  . Anxiety   . Depression   . Cataract     had surgery for  . Diabetes mellitus   . Hypertension   . Arthritis     SHOULDER,KNEES,BACK   Past Surgical History  Procedure Laterality Date  . Cholecystectomy    . Colostomy    . Polypectomy    . Cataract extraction w/ intraocular lens  implant, bilateral     Family History  Problem Relation Age of Onset  . Stroke Mother   . Cancer Father   . Diabetes Sister   . Stroke Sister    History  Substance Use Topics  . Smoking status: Former Games developer  . Smokeless tobacco: Never Used  . Alcohol Use: No   OB History   Grav Para Term Preterm Abortions TAB SAB Ect Mult Living                 Review of Systems  Constitutional: Negative.   HENT: Negative.   Respiratory: Negative for chest tightness, shortness of breath and wheezing.        Dyspnea after climbing steps.  Cardiovascular: Negative for chest pain, palpitations and leg swelling.  Gastrointestinal: Negative.   Genitourinary: Negative for dysuria, frequency, vaginal bleeding, difficulty urinating and pelvic pain.  Musculoskeletal: Negative for back pain, gait problem, myalgias and neck pain.  Skin: Positive for color change.  Neurological:  Negative for dizziness, tremors, seizures, syncope, speech difficulty, weakness and headaches.    Allergies  Review of patient's allergies indicates no known allergies.  Home Medications   Prior to Admission medications   Medication Sig Start Date End Date Taking? Authorizing Provider  amLODipine (NORVASC) 10 MG tablet Take 10 mg by mouth daily.   Yes Historical Provider, MD  aspirin 81 MG tablet Take 81 mg by mouth daily.     Yes Historical Provider, MD  calcium citrate-vitamin D 200-200 MG-UNIT TABS Take 1 tablet by mouth daily.     Yes Historical Provider, MD  citalopram (CELEXA) 20 MG tablet Take 20 mg by mouth daily.     Yes Historical Provider, MD  gemfibrozil (LOPID) 600 MG tablet Take 600 mg by mouth 2 (two) times daily before a meal.   Yes Historical Provider, MD  lisinopril (PRINIVIL,ZESTRIL) 10 MG tablet Take 10 mg by mouth daily.   Yes Historical Provider, MD  mirtazapine (REMERON) 15 MG tablet Take 15 mg by mouth at bedtime.   Yes Historical Provider, MD  montelukast (SINGULAIR) 10 MG tablet Take 10 mg by mouth 1 day or 1 dose.   Yes Historical Provider, MD  oxybutynin (DITROPAN) 5 MG tablet Take 5 mg by mouth 3 (three) times daily.   Yes Historical Provider, MD  PARoxetine (PAXIL) 20 MG tablet Take 20 mg by mouth  daily.   Yes Historical Provider, MD  sitaGLIPtin-metformin (JANUMET) 50-500 MG per tablet Take 1 tablet by mouth every morning.   Yes Historical Provider, MD  traZODone (DESYREL) 50 MG tablet Take 25 mg by mouth at bedtime.   Yes Historical Provider, MD  vitamin B-12 (CYANOCOBALAMIN) 1000 MCG tablet Take 1,000 mcg by mouth daily.     Yes Historical Provider, MD  buPROPion (WELLBUTRIN SR) 200 MG 12 hr tablet Take 200 mg by mouth 2 (two) times daily.      Historical Provider, MD  fish oil-omega-3 fatty acids 1000 MG capsule Take 1,200 mg by mouth daily.      Historical Provider, MD  Multiple Vitamins-Minerals (MULTIVITAMIN WITH MINERALS) tablet Take 1 tablet by mouth  daily.      Historical Provider, MD  pioglitazone-metformin (ACTOPLUS MET) 15-500 MG per tablet Take 1 tablet by mouth daily after breakfast.      Historical Provider, MD  traMADol-acetaminophen (ULTRACET) 37.5-325 MG per tablet Take 1 tablet by mouth 2 (two) times daily as needed. For pain    Historical Provider, MD  valsartan-hydrochlorothiazide (DIOVAN-HCT) 160-25 MG per tablet Take 1 tablet by mouth daily.      Historical Provider, MD   BP 150/87  Pulse 75  Temp(Src) 97.7 F (36.5 C) (Oral)  Resp 16  SpO2 97% Physical Exam  Nursing note and vitals reviewed. Constitutional: She is oriented to person, place, and time. She appears well-developed and well-nourished. No distress.  HENT:  Head: Normocephalic and atraumatic.  Eyes: Conjunctivae and EOM are normal.  Neck: Normal range of motion. Neck supple.  Cardiovascular: Normal rate, regular rhythm, normal heart sounds and intact distal pulses.   Pulmonary/Chest: Effort normal and breath sounds normal. No respiratory distress. She has no wheezes. She has no rales.  Abdominal: Soft. There is no tenderness.  Musculoskeletal: She exhibits edema.  LE with 2-3+ pitting edema, L>R. Dark discoloraton of some areas of ankles and LE's. No tenderness to calves or tension. Some tenderness when pressing the anterior shins and feet. Unable to palpate pedal pulses due to edema. Nl warmth and sensation.   Lymphadenopathy:    She has no cervical adenopathy.  Neurological: She is alert and oriented to person, place, and time. She exhibits normal muscle tone.  Skin: Skin is warm and dry.  Psychiatric: She has a normal mood and affect.    ED Course  Procedures (including critical care time) Labs Review Labs Reviewed  CBC WITH DIFFERENTIAL  COMPREHENSIVE METABOLIC PANEL  D-DIMER, QUANTITATIVE  C-REACTIVE PROTEIN    Imaging Review No results found.   MDM   1. Bilateral leg edema   2. HTN (hypertension)   3. Diabetes     Pt stable, no  distress, chest pain , dyspnea, leg pain. Not toxic nor appears ill. Smiling, talkative.  Will order labs for PCP to have tomorrow to include D-Dimer, CBC, CBC. SHe will see him in the AM Considerations for edema side effect amlodipine, PVD, venous stasis. Evidence does not support DVT. No pain or calf tenderness.  She and brother are satisfied with current plan.     Janne Napoleon, NP 03/25/14 1210

## 2014-03-25 NOTE — Discharge Instructions (Signed)
Blood Glucose Monitoring, Adult Monitoring your blood glucose (also know as blood sugar) helps you to manage your diabetes. It also helps you and your health care provider monitor your diabetes and determine how well your treatment plan is working. WHY SHOULD YOU MONITOR YOUR BLOOD GLUCOSE?  It can help you understand how food, exercise, and medicine affect your blood glucose.  It allows you to know what your blood glucose is at any given moment. You can quickly tell if you are having low blood glucose (hypoglycemia) or high blood glucose (hyperglycemia).  It can help you and your health care provider know how to adjust your medicines.  It can help you understand how to manage an illness or adjust medicine for exercise. WHEN SHOULD YOU TEST? Your health care provider will help you decide how often you should check your blood glucose. This may depend on the type of diabetes you have, your diabetes control, or the types of medicines you are taking. Be sure to write down all of your blood glucose readings so that this information can be reviewed with your health care provider. See below for examples of testing times that your health care provider may suggest. Type 1 Diabetes  Test 4 times a day if you are in good control, using an insulin pump, or perform multiple daily injections.  If your diabetes is not well-controlled or if you are sick, you may need to monitor more often.  It is a good idea to also monitor:  Before and after exercise.  Between meals and 2 hours after a meal.  Occasionally between 2:00 to 3:00 am. Type 2 Diabetes  It can vary with each person, but generally, if you are on insulin, test 4 times a day.  If you take medicines by mouth (orally), test 2 times a day.  If you are on a controlled diet, test once a day.  If your diabetes is not well controlled or if you are sick, you may need to monitor more often. HOW TO MONITOR YOUR BLOOD GLUCOSE Supplies  Needed  Blood glucose meter.  Test strips for your meter. Each meter has its own strips. You must use the strips that go with your own meter.  A pricking needle (lancet).  A device that holds the lancet (lancing device).  A journal or log book to write down your results. Procedure  Wash your hands with soap and water. Alcohol is not preferred.  Prick the side of your finger (not the tip) with the lancet.  Gently milk the finger until a small drop of blood appears.  Follow the instructions that come with your meter for inserting the test strip, applying blood to the strip, and using your blood glucose meter. Other Areas to Get Blood for Testing Some meters allow you to use other areas of your body (other than your finger) to test your blood. These areas are called alternative sites. The most common alternative sites are:  The forearm.  The thigh.  The back area of the lower leg.  The palm of the hand. The blood flow in these areas is slower. Therefore, the blood glucose values you get may be delayed, and the numbers are different from what you would get from your fingers. Do not use alternative sites if you think you are having hypoglycemia. Your reading will not be accurate. Always use a finger if you are having hypoglycemia. Also, if you cannot feel your lows (hypoglycemia unawareness), always use your fingers for your blood  glucose checks. ADDITIONAL TIPS FOR GLUCOSE MONITORING  Do not reuse lancets.  Always carry your supplies with you.  All blood glucose meters have a 24-hour "hotline" number to call if you have questions or need help.  Adjust (calibrate) your blood glucose meter with a control solution after finishing a few boxes of strips. BLOOD GLUCOSE RECORD KEEPING It is a good idea to keep a daily record or log of your blood glucose readings. Most glucose meters, if not all, keep your glucose records stored in the meter. Some meters come with the ability to download  your records to your home computer. Keeping a record of your blood glucose readings is especially helpful if you are wanting to look for patterns. Make notes to go along with the blood glucose readings because you might forget what happened at that exact time. Keeping good records helps you and your health care provider to work together to achieve good diabetes management.  Document Released: 10/19/2003 Document Revised: 06/18/2013 Document Reviewed: 03/10/2013 [facility] Patient Information 2014 [place].  Edema Edema is an abnormal build-up of fluids in tissues. Because this is partly dependent on gravity (water flows to the lowest place), it is more common in the legs and thighs (lower extremities). It is also common in the looser tissues, like around the eyes. Painless swelling of the feet and ankles is common and increases as a person ages. It may affect both legs and may include the calves or even thighs. When squeezed, the fluid may move out of the affected area and may leave a dent for a few moments. CAUSES   Prolonged standing or sitting in one place for extended periods of time. Movement helps pump tissue fluid into the veins, and absence of movement prevents this, resulting in edema.  Varicose veins. The valves in the veins do not work as well as they should. This causes fluid to leak into the tissues.  Fluid and salt overload.  Injury, burn, or surgery to the leg, ankle, or foot, may damage veins and allow fluid to leak out.  Sunburn damages vessels. Leaky vessels allow fluid to go out into the sunburned tissues.  Allergies (from insect bites or stings, medications or chemicals) cause swelling by allowing vessels to become leaky.  Protein in the blood helps keep fluid in your vessels. Low protein, as in malnutrition, allows fluid to leak out.  Hormonal changes, including pregnancy and menstruation, cause fluid retention. This fluid may leak out of vessels and cause  edema.  Medications that cause fluid retention. Examples are sex hormones, blood pressure medications, steroid treatment, or anti-depressants.  Some illnesses cause edema, especially heart failure, kidney disease, or liver disease.  Surgery that cuts veins or lymph nodes, such as surgery done for the heart or for breast cancer, may result in edema. DIAGNOSIS  Your caregiver is usually easily able to determine what is causing your swelling (edema) by simply asking what is wrong (getting a history) and examining you (doing a physical). Sometimes x-rays, EKG (electrocardiogram or heart tracing), and blood work may be done to evaluate for underlying medical illness. TREATMENT  General treatment includes:  Leg elevation (or elevation of the affected body part).  Restriction of fluid intake.  Prevention of fluid overload.  Compression of the affected body part. Compression with elastic bandages or support stockings squeezes the tissues, preventing fluid from entering and forcing it back into the blood vessels.  Diuretics (also called water pills or fluid pills) pull fluid out of  your body in the form of increased urination. These are effective in reducing the swelling, but can have side effects and must be used only under your caregiver's supervision. Diuretics are appropriate only for some types of edema. The specific treatment can be directed at any underlying causes discovered. Heart, liver, or kidney disease should be treated appropriately. HOME CARE INSTRUCTIONS   Elevate the legs (or affected body part) above the level of the heart, while lying down.  Avoid sitting or standing still for prolonged periods of time.  Avoid putting anything directly under the knees when lying down, and do not wear constricting clothing or garters on the upper legs.  Exercising the legs causes the fluid to work back into the veins and lymphatic channels. This may help the swelling go down.  The pressure  applied by elastic bandages or support stockings can help reduce ankle swelling.  A low-salt diet may help reduce fluid retention and decrease the ankle swelling.  Take any medications exactly as prescribed. SEEK MEDICAL CARE IF:  Your edema is not responding to recommended treatments. SEEK IMMEDIATE MEDICAL CARE IF:   You develop shortness of breath or chest pain.  You cannot breathe when you lay down; or if, while lying down, you have to get up and go to the window to get your breath.  You are having increasing swelling without relief from treatment.  You develop a fever over 102 F (38.9 C).  You develop pain or redness in the areas that are swollen.  Tell your caregiver right away if you have gained 03 lb/1.4 kg in 1 day or 05 lb/2.3 kg in a week. MAKE SURE YOU:   Understand these instructions.  Will watch your condition.  Will get help right away if you are not doing well or get worse. Document Released: 10/16/2005 Document Revised: 04/16/2012 Document Reviewed: 06/03/2008 [facility] Patient Information 2014 [place].  Hypertension Hypertension is another name for high blood pressure. High blood pressure may mean that your heart needs to work harder to pump blood. Blood pressure consists of two numbers, which includes a higher number over a lower number (example: 110/72). HOME CARE   Make lifestyle changes as told by your doctor. This may include weight loss and exercise.  Take your blood pressure medicine every day.  Limit how much salt you use.  Stop smoking if you smoke.  Do not use drugs.  Talk to your doctor if you are using decongestants or birth control pills. These medicines might make blood pressure higher.  Females should not drink more than 1 alcoholic drink per day. Males should not drink more than 2 alcoholic drinks per day.  See your doctor as told. GET HELP RIGHT AWAY IF:   You have a blood pressure reading with a top number of 180 or  higher.  You get a very bad headache.  You get blurred or changing vision.  You feel confused.  You feel weak, numb, or faint.  You get chest or belly (abdominal) pain.  You throw up (vomit).  You cannot breathe very well. MAKE SURE YOU:   Understand these instructions.  Will watch your condition.  Will get help right away if you are not doing well or get worse. Document Released: 04/03/2008 Document Revised: 01/08/2012 Document Reviewed: 04/03/2008 [facility] Patient Information 2014 [place], [place].  Peripheral Edema You have swelling in your legs (peripheral edema). This swelling is due to excess accumulation of salt and water in your body. Edema may be  a sign of heart, kidney or liver disease, or a side effect of a medication. It may also be due to problems in the leg veins. Elevating your legs and using special support stockings may be very helpful, if the cause of the swelling is due to poor venous circulation. Avoid long periods of standing, whatever the cause. Treatment of edema depends on identifying the cause. Chips, pretzels, pickles and other salty foods should be avoided. Restricting salt in your diet is almost always needed. Water pills (diuretics) are often used to remove the excess salt and water from your body via urine. These medicines prevent the kidney from reabsorbing sodium. This increases urine flow. Diuretic treatment may also result in lowering of potassium levels in your body. Potassium supplements may be needed if you have to use diuretics daily. Daily weights can help you keep track of your progress in clearing your edema. You should call your caregiver for follow up care as recommended. SEEK IMMEDIATE MEDICAL CARE IF:   You have increased swelling, pain, redness, or heat in your legs.  You develop shortness of breath, especially when lying down.  You develop chest or abdominal pain, weakness, or fainting.  You have a fever. Document Released:  11/23/2004 Document Revised: 01/08/2012 Document Reviewed: 11/03/2009 [facility] Patient Information 2014 [place].

## 2014-03-26 NOTE — ED Provider Notes (Signed)
Medical screening examination/treatment/procedure(s) were performed by a resident physician or non-physician practitioner and as the supervising physician I was immediately available for consultation/collaboration.  Lynne Leader, MD    Gregor Hams, MD 03/26/14 [phone]

## 2014-04-20 DIAGNOSIS — I10 Essential (primary) hypertension: Secondary | ICD-10-CM | POA: Diagnosis not present

## 2014-04-20 DIAGNOSIS — R609 Edema, unspecified: Secondary | ICD-10-CM | POA: Diagnosis not present

## 2014-04-20 DIAGNOSIS — N390 Urinary tract infection, site not specified: Secondary | ICD-10-CM | POA: Diagnosis not present

## 2014-04-20 DIAGNOSIS — E119 Type 2 diabetes mellitus without complications: Secondary | ICD-10-CM | POA: Diagnosis not present

## 2014-04-27 DIAGNOSIS — F3189 Other bipolar disorder: Secondary | ICD-10-CM | POA: Diagnosis not present

## 2014-06-10 DIAGNOSIS — B182 Chronic viral hepatitis C: Secondary | ICD-10-CM | POA: Diagnosis not present

## 2014-06-11 ENCOUNTER — Other Ambulatory Visit: Payer: Self-pay | Admitting: Nurse Practitioner

## 2014-06-11 DIAGNOSIS — B182 Chronic viral hepatitis C: Secondary | ICD-10-CM | POA: Diagnosis not present

## 2014-06-11 DIAGNOSIS — C220 Liver cell carcinoma: Secondary | ICD-10-CM

## 2014-06-15 DIAGNOSIS — F3189 Other bipolar disorder: Secondary | ICD-10-CM | POA: Diagnosis not present

## 2014-07-21 DIAGNOSIS — B182 Chronic viral hepatitis C: Secondary | ICD-10-CM | POA: Diagnosis not present

## 2014-08-04 ENCOUNTER — Ambulatory Visit
Admission: RE | Admit: 2014-08-04 | Discharge: 2014-08-04 | Disposition: A | Discharge status: Home / Self Care | Payer: Medicare Other | Source: Ambulatory Visit | Attending: Nurse Practitioner | Admitting: Nurse Practitioner

## 2014-08-04 DIAGNOSIS — C220 Liver cell carcinoma: Secondary | ICD-10-CM

## 2014-08-04 DIAGNOSIS — B1920 Unspecified viral hepatitis C without hepatic coma: Secondary | ICD-10-CM | POA: Diagnosis not present

## 2014-08-06 DIAGNOSIS — J45909 Unspecified asthma, uncomplicated: Secondary | ICD-10-CM | POA: Diagnosis not present

## 2014-08-06 DIAGNOSIS — R05 Cough: Secondary | ICD-10-CM | POA: Diagnosis not present

## 2014-08-17 DIAGNOSIS — Z23 Encounter for immunization: Secondary | ICD-10-CM | POA: Diagnosis not present

## 2014-08-20 DIAGNOSIS — R51 Headache: Secondary | ICD-10-CM | POA: Diagnosis not present

## 2014-08-20 DIAGNOSIS — Z01818 Encounter for other preprocedural examination: Secondary | ICD-10-CM | POA: Diagnosis not present

## 2014-08-20 DIAGNOSIS — H05223 Edema of bilateral orbit: Secondary | ICD-10-CM | POA: Diagnosis not present

## 2014-08-20 DIAGNOSIS — K089 Disorder of teeth and supporting structures, unspecified: Secondary | ICD-10-CM | POA: Diagnosis not present

## 2014-08-20 DIAGNOSIS — I10 Essential (primary) hypertension: Secondary | ICD-10-CM | POA: Diagnosis not present

## 2014-08-27 DIAGNOSIS — B182 Chronic viral hepatitis C: Secondary | ICD-10-CM | POA: Diagnosis not present

## 2014-09-01 DIAGNOSIS — F3181 Bipolar II disorder: Secondary | ICD-10-CM | POA: Diagnosis not present

## 2014-09-08 ENCOUNTER — Inpatient Hospital Stay (HOSPITAL_COMMUNITY)
Admission: EM | Admit: 2014-09-08 | Discharge: 2014-09-11 | DRG: 441 | Disposition: A | Discharge status: Skilled Nursing Facility | Payer: Medicare Other | Attending: Infectious Diseases | Admitting: Infectious Diseases

## 2014-09-08 ENCOUNTER — Emergency Department (HOSPITAL_COMMUNITY): Payer: Medicare Other

## 2014-09-08 ENCOUNTER — Encounter (HOSPITAL_COMMUNITY): Payer: Self-pay | Admitting: *Deleted

## 2014-09-08 ENCOUNTER — Inpatient Hospital Stay (HOSPITAL_COMMUNITY): Payer: Medicare Other

## 2014-09-08 DIAGNOSIS — I10 Essential (primary) hypertension: Secondary | ICD-10-CM | POA: Diagnosis present

## 2014-09-08 DIAGNOSIS — K7460 Unspecified cirrhosis of liver: Secondary | ICD-10-CM | POA: Diagnosis not present

## 2014-09-08 DIAGNOSIS — R5383 Other fatigue: Secondary | ICD-10-CM | POA: Diagnosis not present

## 2014-09-08 DIAGNOSIS — G9340 Encephalopathy, unspecified: Secondary | ICD-10-CM | POA: Diagnosis present

## 2014-09-08 DIAGNOSIS — G4700 Insomnia, unspecified: Secondary | ICD-10-CM | POA: Diagnosis present

## 2014-09-08 DIAGNOSIS — D61818 Other pancytopenia: Secondary | ICD-10-CM | POA: Diagnosis present

## 2014-09-08 DIAGNOSIS — F329 Major depressive disorder, single episode, unspecified: Secondary | ICD-10-CM | POA: Diagnosis present

## 2014-09-08 DIAGNOSIS — Z833 Family history of diabetes mellitus: Secondary | ICD-10-CM

## 2014-09-08 DIAGNOSIS — Z823 Family history of stroke: Secondary | ICD-10-CM

## 2014-09-08 DIAGNOSIS — K7469 Other cirrhosis of liver: Secondary | ICD-10-CM | POA: Diagnosis not present

## 2014-09-08 DIAGNOSIS — Z87891 Personal history of nicotine dependence: Secondary | ICD-10-CM

## 2014-09-08 DIAGNOSIS — Z820 Family history of epilepsy and other diseases of the nervous system: Secondary | ICD-10-CM

## 2014-09-08 DIAGNOSIS — D696 Thrombocytopenia, unspecified: Secondary | ICD-10-CM | POA: Diagnosis present

## 2014-09-08 DIAGNOSIS — I509 Heart failure, unspecified: Secondary | ICD-10-CM | POA: Diagnosis not present

## 2014-09-08 DIAGNOSIS — R918 Other nonspecific abnormal finding of lung field: Secondary | ICD-10-CM | POA: Diagnosis not present

## 2014-09-08 DIAGNOSIS — Z809 Family history of malignant neoplasm, unspecified: Secondary | ICD-10-CM | POA: Diagnosis not present

## 2014-09-08 DIAGNOSIS — M1991 Primary osteoarthritis, unspecified site: Secondary | ICD-10-CM | POA: Diagnosis present

## 2014-09-08 DIAGNOSIS — B182 Chronic viral hepatitis C: Secondary | ICD-10-CM | POA: Insufficient documentation

## 2014-09-08 DIAGNOSIS — F0390 Unspecified dementia without behavioral disturbance: Secondary | ICD-10-CM | POA: Diagnosis present

## 2014-09-08 DIAGNOSIS — F419 Anxiety disorder, unspecified: Secondary | ICD-10-CM | POA: Diagnosis present

## 2014-09-08 DIAGNOSIS — M19019 Primary osteoarthritis, unspecified shoulder: Secondary | ICD-10-CM | POA: Diagnosis present

## 2014-09-08 DIAGNOSIS — K7682 Hepatic encephalopathy: Secondary | ICD-10-CM

## 2014-09-08 DIAGNOSIS — Z794 Long term (current) use of insulin: Secondary | ICD-10-CM | POA: Diagnosis not present

## 2014-09-08 DIAGNOSIS — B1920 Unspecified viral hepatitis C without hepatic coma: Secondary | ICD-10-CM

## 2014-09-08 DIAGNOSIS — R4182 Altered mental status, unspecified: Secondary | ICD-10-CM

## 2014-09-08 DIAGNOSIS — K5900 Constipation, unspecified: Secondary | ICD-10-CM | POA: Diagnosis present

## 2014-09-08 DIAGNOSIS — E8770 Fluid overload, unspecified: Secondary | ICD-10-CM | POA: Diagnosis present

## 2014-09-08 DIAGNOSIS — R531 Weakness: Secondary | ICD-10-CM | POA: Diagnosis not present

## 2014-09-08 DIAGNOSIS — I5030 Unspecified diastolic (congestive) heart failure: Secondary | ICD-10-CM | POA: Diagnosis present

## 2014-09-08 DIAGNOSIS — I517 Cardiomegaly: Secondary | ICD-10-CM | POA: Diagnosis not present

## 2014-09-08 DIAGNOSIS — K7290 Hepatic failure, unspecified without coma: Principal | ICD-10-CM | POA: Diagnosis present

## 2014-09-08 DIAGNOSIS — R260 Ataxic gait: Secondary | ICD-10-CM | POA: Diagnosis not present

## 2014-09-08 DIAGNOSIS — I6782 Cerebral ischemia: Secondary | ICD-10-CM | POA: Diagnosis not present

## 2014-09-08 DIAGNOSIS — E8779 Other fluid overload: Secondary | ICD-10-CM | POA: Diagnosis not present

## 2014-09-08 DIAGNOSIS — E119 Type 2 diabetes mellitus without complications: Secondary | ICD-10-CM | POA: Diagnosis present

## 2014-09-08 DIAGNOSIS — M170 Bilateral primary osteoarthritis of knee: Secondary | ICD-10-CM | POA: Diagnosis present

## 2014-09-08 DIAGNOSIS — R7989 Other specified abnormal findings of blood chemistry: Secondary | ICD-10-CM

## 2014-09-08 DIAGNOSIS — R41841 Cognitive communication deficit: Secondary | ICD-10-CM | POA: Diagnosis not present

## 2014-09-08 DIAGNOSIS — E7220 Disorder of urea cycle metabolism, unspecified: Secondary | ICD-10-CM | POA: Diagnosis not present

## 2014-09-08 DIAGNOSIS — M6281 Muscle weakness (generalized): Secondary | ICD-10-CM | POA: Diagnosis not present

## 2014-09-08 DIAGNOSIS — D72819 Decreased white blood cell count, unspecified: Secondary | ICD-10-CM | POA: Diagnosis present

## 2014-09-08 DIAGNOSIS — Z9049 Acquired absence of other specified parts of digestive tract: Secondary | ICD-10-CM | POA: Diagnosis present

## 2014-09-08 DIAGNOSIS — R400 Somnolence: Secondary | ICD-10-CM

## 2014-09-08 LAB — URINALYSIS, ROUTINE W REFLEX MICROSCOPIC
Bilirubin Urine: NEGATIVE
Glucose, UA: >1000 mg/dL — AB
Ketones, ur: NEGATIVE mg/dL
Nitrite: NEGATIVE
Protein, ur: NEGATIVE mg/dL
Specific Gravity, Urine: 1.02 (ref 1.005–1.030)
Urobilinogen, UA: 2 mg/dL — ABNORMAL HIGH (ref 0.0–1.0)
pH: 6.5 (ref 5.0–8.0)

## 2014-09-08 LAB — COMPREHENSIVE METABOLIC PANEL
ALT: 30 U/L (ref 0–35)
AST: 43 U/L — ABNORMAL HIGH (ref 0–37)
Albumin: 2.6 g/dL — ABNORMAL LOW (ref 3.5–5.2)
Alkaline Phosphatase: 257 U/L — ABNORMAL HIGH (ref 39–117)
Anion gap: 13 (ref 5–15)
BUN: 13 mg/dL (ref 6–23)
CO2: 25 mEq/L (ref 19–32)
Calcium: 9.1 mg/dL (ref 8.4–10.5)
Chloride: 100 mEq/L (ref 96–112)
Creatinine, Ser: 0.63 mg/dL (ref 0.50–1.10)
GFR calc Af Amer: >90 mL/min (ref >90)
GFR calc non Af Amer: 89 mL/min — ABNORMAL LOW (ref >90)
Glucose, Bld: 338 mg/dL — ABNORMAL HIGH (ref 70–99)
Potassium: 3.7 mEq/L (ref 3.7–5.3)
Sodium: 138 mEq/L (ref 137–147)
Total Bilirubin: 0.7 mg/dL (ref 0.3–1.2)
Total Protein: 8.3 g/dL (ref 6.0–8.3)

## 2014-09-08 LAB — URINE MICROSCOPIC-ADD ON

## 2014-09-08 LAB — CBC WITH DIFFERENTIAL/PLATELET
Basophils Absolute: 0 10*3/uL (ref 0.0–0.1)
Basophils Relative: 1 % (ref 0–1)
Eosinophils Absolute: 0.2 10*3/uL (ref 0.0–0.7)
Eosinophils Relative: 4 % (ref 0–5)
HCT: 34.2 % — ABNORMAL LOW (ref 36.0–46.0)
Hemoglobin: 11.2 g/dL — ABNORMAL LOW (ref 12.0–15.0)
Lymphocytes Relative: 33 % (ref 12–46)
Lymphs Abs: 1.2 10*3/uL (ref 0.7–4.0)
MCH: 28.6 pg (ref 26.0–34.0)
MCHC: 32.7 g/dL (ref 30.0–36.0)
MCV: 87.2 fL (ref 78.0–100.0)
Monocytes Absolute: 0.3 10*3/uL (ref 0.1–1.0)
Monocytes Relative: 8 % (ref 3–12)
Neutro Abs: 2.1 10*3/uL (ref 1.7–7.7)
Neutrophils Relative %: 55 % (ref 43–77)
Platelets: 87 10*3/uL — ABNORMAL LOW (ref 150–400)
RBC: 3.92 MIL/uL (ref 3.87–5.11)
RDW: 14.7 % (ref 11.5–15.5)
WBC: 3.8 10*3/uL — ABNORMAL LOW (ref 4.0–10.5)

## 2014-09-08 LAB — I-STAT ARTERIAL BLOOD GAS, ED
Acid-Base Excess: 4 mmol/L — ABNORMAL HIGH (ref 0.0–2.0)
Bicarbonate: 29 mEq/L — ABNORMAL HIGH (ref 20.0–24.0)
O2 Saturation: 95 %
Patient temperature: 98.6
TCO2: 30 mmol/L (ref 0–100)
pCO2 arterial: 44.4 mmHg (ref 35.0–45.0)
pH, Arterial: 7.423 (ref 7.350–7.450)
pO2, Arterial: 72 mmHg — ABNORMAL LOW (ref 80.0–100.0)

## 2014-09-08 LAB — AMMONIA: Ammonia: 103 umol/L — ABNORMAL HIGH (ref 11–60)

## 2014-09-08 LAB — PROTIME-INR
INR: 1.32 (ref 0.00–1.49)
Prothrombin Time: 16.5 seconds — ABNORMAL HIGH (ref 11.6–15.2)

## 2014-09-08 LAB — I-STAT CG4 LACTIC ACID, ED: Lactic Acid, Venous: 1.26 mmol/L (ref 0.5–2.2)

## 2014-09-08 LAB — GLUCOSE, CAPILLARY: Glucose-Capillary: 172 mg/dL — ABNORMAL HIGH (ref 70–99)

## 2014-09-08 MED ORDER — ARIPIPRAZOLE 10 MG PO TABS
10.0000 mg | ORAL_TABLET | Freq: Every day | ORAL | Status: DC
  Administered 2014-09-09 – 2014-09-11 (×3): 10 mg via ORAL
  Filled 2014-09-08 (×4): qty 1

## 2014-09-08 MED ORDER — PAROXETINE HCL 20 MG PO TABS
20.0000 mg | ORAL_TABLET | Freq: Every day | ORAL | Status: DC
  Administered 2014-09-09 – 2014-09-11 (×3): 20 mg via ORAL
  Filled 2014-09-08 (×3): qty 1

## 2014-09-08 MED ORDER — LACTULOSE ENEMA
300.0000 mL | Freq: Once | ORAL | Status: AC
  Administered 2014-09-08: 300 mL via RECTAL
  Filled 2014-09-08: qty 300

## 2014-09-08 MED ORDER — LACTULOSE 10 GM/15ML PO SOLN
30.0000 g | Freq: Every day | ORAL | Status: DC
  Administered 2014-09-09 – 2014-09-11 (×3): 30 g via ORAL
  Filled 2014-09-08 (×3): qty 45

## 2014-09-08 MED ORDER — ENOXAPARIN SODIUM 40 MG/0.4ML ~~LOC~~ SOLN
40.0000 mg | SUBCUTANEOUS | Status: DC
  Administered 2014-09-08 – 2014-09-09 (×2): 40 mg via SUBCUTANEOUS
  Filled 2014-09-08 (×2): qty 0.4

## 2014-09-08 MED ORDER — OXYBUTYNIN CHLORIDE 5 MG PO TABS
5.0000 mg | ORAL_TABLET | Freq: Two times a day (BID) | ORAL | Status: DC
  Administered 2014-09-09 – 2014-09-11 (×5): 5 mg via ORAL
  Filled 2014-09-08 (×5): qty 1

## 2014-09-08 MED ORDER — MONTELUKAST SODIUM 10 MG PO TABS
10.0000 mg | ORAL_TABLET | Freq: Every day | ORAL | Status: DC
  Administered 2014-09-09 – 2014-09-11 (×3): 10 mg via ORAL
  Filled 2014-09-08 (×3): qty 1

## 2014-09-08 MED ORDER — LATANOPROST 0.005 % OP SOLN
1.0000 [drp] | Freq: Every day | OPHTHALMIC | Status: DC
  Administered 2014-09-08 – 2014-09-10 (×3): 1 [drp] via OPHTHALMIC
  Filled 2014-09-08: qty 2.5

## 2014-09-08 MED ORDER — INSULIN ASPART 100 UNIT/ML ~~LOC~~ SOLN
0.0000 [IU] | SUBCUTANEOUS | Status: DC
  Administered 2014-09-08: 2 [IU] via SUBCUTANEOUS

## 2014-09-08 NOTE — ED Notes (Signed)
Insomnia, weakness x 3 -4 weeks. Good appetite.

## 2014-09-08 NOTE — H&P (Signed)
Date: 09/08/2014               Patient Name:  Carol Rubio MRN: [id number]  DOB: [birth year] Age / Sex: 68 y.o., female   PCP: Barton Fanny, MD         Medical Service: Internal Medicine Teaching Service         Attending Physician: Dr. Campbell Riches, MD    First Contact: Dr. Genene Churn Pager: [phone]  Second Contact: Dr. Ronnald Ramp Pager: [phone]       After Hours (After 5p/  First Contact Pager: [phone]  weekends / holidays): Second Contact Pager: [phone]   Chief Complaint: lethargic  History of Present Illness:  68 yo female with hx of DM (diet controlled), dementia, HTN,Hep C (on harvoni for last 6 weeks), cirrhosis, lower ext edema, here with increased lethargy. Patient has been drowsy for last few days but has increased progressively. She was seen at the Hep C clinic today. They sent her here for her lethargy. Patient has lower ext swelling for several months. Her average weight is 140-155 but now she is up in 173. This increase happened over last 3-4 months. No recent ECHO was done. Denies any sob/chest pain.   Hx obtained mainly from patient's brother. Patient lives with her sister (who is in her 13's). She has been having increased forgetfulness for last 3 months, (forgets car keys, forgets that she ran stop signs). [facility]. Follows with mental health also for anxiety/depression.  Ammonia level was 103. cxr showed Bilateral interstitial opacity, favored congestive rather than infectious. UA was negative for UTI. Ct head shows chronic microvascular ischemic change.  Meds: Current Facility-Administered Medications  Medication Dose Route Frequency Provider Last Rate Last Dose  . [START ON 09/09/2014] ARIPiprazole (ABILIFY) tablet 10 mg  10 mg Oral Daily Corky Sox, MD      . enoxaparin (LOVENOX) injection 40 mg  40 mg Subcutaneous Q24H Corky Sox, MD      . Derrill Memo ON 09/09/2014] lactulose (CHRONULAC) 10 GM/15ML solution 30 g  30 g Oral  Daily Corky Sox, MD      . lactulose [facility]) enema 300 mL  300 mL Rectal Once Fransico Meadow, PA-C      . latanoprost (XALATAN) 0.005 % ophthalmic solution 1 drop  1 drop Both Eyes QHS Corky Sox, MD      . Derrill Memo ON 09/09/2014] montelukast (SINGULAIR) tablet 10 mg  10 mg Oral Daily Corky Sox, MD      . Derrill Memo ON 09/09/2014] oxybutynin (DITROPAN) tablet 5 mg  5 mg Oral BID Corky Sox, MD      . Derrill Memo ON 09/09/2014] PARoxetine (PAXIL) tablet 20 mg  20 mg Oral Daily Corky Sox, MD        Allergies: Allergies as of 09/08/2014  . (No Known Allergies)   Past Medical History  Diagnosis Date  . Anxiety   . Depression   . Cataract     had surgery for  . Diabetes mellitus   . Hypertension   . Arthritis     SHOULDER,KNEES,BACK   Past Surgical History  Procedure Laterality Date  . Cholecystectomy    . Colostomy    . Polypectomy    . Cataract extraction w/ intraocular lens  implant, bilateral     Family History  Problem Relation Age of Onset  . Stroke Mother   . Cancer Father   . Diabetes Sister   .  Stroke Sister    History   Social History  . Marital Status: Divorced    Spouse Name: N/A    Number of Children: N/A  . Years of Education: N/A   Occupational History  . Not on file.   Social History Main Topics  . Smoking status: Former Research scientist (life sciences)  . Smokeless tobacco: Never Used  . Alcohol Use: No  . Drug Use: No  . Sexual Activity: Not on file   Other Topics Concern  . Not on file   Social History Narrative    Review of Systems: Review of Systems  Constitutional: Positive for malaise/fatigue. Negative for fever and chills.  HENT: Negative for congestion and hearing loss.   Eyes: Negative for blurred vision, photophobia and redness.  Respiratory: Negative.  Negative for cough, hemoptysis, shortness of breath and wheezing.   Cardiovascular: Positive for leg swelling. Negative for chest pain and palpitations.  Gastrointestinal: Negative for heartburn,  nausea, vomiting, abdominal pain, diarrhea and constipation.  Genitourinary: Negative for dysuria, frequency and flank pain.  Musculoskeletal: Negative.   Skin: Negative.   Neurological: Positive for weakness. Negative for dizziness, tremors, speech change and focal weakness.  Endo/Heme/Allergies: Negative.   Psychiatric/Behavioral: Positive for depression and memory loss. The patient has insomnia.      Physical Exam: Blood pressure 126/70, pulse 65, temperature 97 F (36.1 C), temperature source Oral, resp. rate 16, height _0  (1.575 m), weight 173 lb (78.472 kg), SpO2 97 %. Physical Exam  Constitutional: She is oriented to person, place, and time. She appears well-developed. No distress.  Sleeping mostly but wakes up when asked.   HENT:  Head: Normocephalic and atraumatic.  Right Ear: External ear normal.  Left Ear: External ear normal.  Mouth/Throat: No oropharyngeal exudate.  Eyes: Conjunctivae and EOM are normal. Pupils are equal, round, and reactive to light. Right eye exhibits no discharge. Left eye exhibits no discharge. No scleral icterus.  Neck: Normal range of motion. Neck supple. No JVD present. No tracheal deviation present. No thyromegaly present.  Cardiovascular: Normal rate, regular rhythm and normal heart sounds.  Exam reveals no gallop and no friction rub.   No murmur heard. Respiratory: Effort normal and breath sounds normal. No stridor. No respiratory distress. She has no wheezes. She has no rales. She exhibits no tenderness.  GI: Soft. Bowel sounds are normal. She exhibits distension. She exhibits no mass. There is no tenderness. There is no rebound and no guarding.  Had distension. No fluid wave appreciated.   Musculoskeletal: Normal range of motion. She exhibits edema. She exhibits no tenderness.  Has 2+ pitting edema upto knees. Has compression socks.   Lymphadenopathy:    She has no cervical adenopathy.  Neurological: She is oriented to person, place, and  time. She displays normal reflexes. No cranial nerve deficit. Coordination normal.  Orientedx4 but fall asleep easily during interview.  Has mild asterixis.   Skin: Skin is warm. No rash noted. She is not diaphoretic. No erythema. No pallor.     Lab results: Basic Metabolic Panel:  Recent Labs  09/08/14 1310  NA 138  K 3.7  CL 100  CO2 25  GLUCOSE 338*  BUN 13  CREATININE 0.63  CALCIUM 9.1   Liver Function Tests:  Recent Labs  09/08/14 1310  AST 43*  ALT 30  ALKPHOS 257*  BILITOT 0.7  PROT 8.3  ALBUMIN 2.6*   No results for input(s): LIPASE, AMYLASE in the last 72 hours.  Recent Labs  09/08/14 1347  AMMONIA 103*   CBC:  Recent Labs  09/08/14 1310  WBC 3.8*  NEUTROABS 2.1  HGB 11.2*  HCT 34.2*  MCV 87.2  PLT 87*   Cardiac Enzymes: No results for input(s): CKTOTAL, CKMB, CKMBINDEX, TROPONINI in the last 72 hours. BNP: No results for input(s): PROBNP in the last 72 hours. D-Dimer: No results for input(s): DDIMER in the last 72 hours. CBG: No results for input(s): GLUCAP in the last 72 hours. Hemoglobin A1C: No results for input(s): HGBA1C in the last 72 hours. Fasting Lipid Panel: No results for input(s): CHOL, HDL, LDLCALC, TRIG, CHOLHDL, LDLDIRECT in the last 72 hours. Thyroid Function Tests: No results for input(s): TSH, T4TOTAL, FREET4, T3FREE, THYROIDAB in the last 72 hours. Anemia Panel: No results for input(s): VITAMINB12, FOLATE, FERRITIN, TIBC, IRON, RETICCTPCT in the last 72 hours. Coagulation:  Recent Labs  09/08/14 1310  LABPROT 16.5*  INR 1.32   Urine Drug Screen: Drugs of Abuse  No results found for: LABOPIA, COCAINSCRNUR, LABBENZ, AMPHETMU, THCU, LABBARB  Alcohol Level: No results for input(s): ETH in the last 72 hours. Urinalysis:  Recent Labs  09/08/14 1601  COLORURINE YELLOW  LABSPEC 1.020  PHURINE 6.5  GLUCOSEU >1000*  HGBUR SMALL*  BILIRUBINUR NEGATIVE  KETONESUR NEGATIVE  PROTEINUR NEGATIVE  UROBILINOGEN  2.0*  NITRITE NEGATIVE  LEUKOCYTESUR SMALL*   Misc. Labs:  Imaging results:  Dg Chest 2 View  09/08/2014   CLINICAL DATA:  Altered mental status.  EXAM: CHEST  2 VIEW  COMPARISON:  06/16/2013  FINDINGS: Borderline cardiomegaly. Negative aortic and hilar contours. Diffuse interstitial coarsening with cephalized blood flow. No Kerley lines or pleural effusion. No focal opacity confirmed on both views.  IMPRESSION: Bilateral interstitial opacity, favored congestive rather than infectious.   Electronically Signed   By: Jorje Guild M.D.   On: 09/08/2014 16:05   Ct Head Wo Contrast  09/08/2014   CLINICAL DATA:  Insomnia.  Weakness for 3-4 weeks.  Memory problems.  EXAM: CT HEAD WITHOUT CONTRAST  TECHNIQUE: Contiguous axial images were obtained from the base of the skull through the vertex without intravenous contrast.  COMPARISON:  Brain MRI, 05/03/2012 and head CT, 10/05/2011.  FINDINGS: Ventricles are normal in configuration. There is ventricular and sulcal enlargement reflecting mild atrophy. No hydrocephalus.  No parenchymal masses or mass effect. Patchy areas of white matter hypoattenuation are noted consistent with mild chronic microvascular ischemic change.  There is no evidence of a cortical infarct.  There are no extra-axial masses or abnormal fluid collections.  No intracranial hemorrhage.  Visualized sinuses and mastoid air cells are clear. No skull lesion.  IMPRESSION: 1. No acute intracranial abnormalities. 2. Mild atrophy and chronic microvascular ischemic change.   Electronically Signed   By: Lajean Manes M.D.   On: 09/08/2014 16:17    Other results: EKG: sinus, pac.   Assessment & Plan by Problem: Active Problems:   Weakness   Acute encephalopathy  68 yo female with Hep C, cirrhosis, dementia here with acute encephalopathy most likely 2/2 to hepatic cause.  Hepatic encephalopathy - ammonia 103. Other causes unlikely as CT negative. UA negative. Electrolytes are normal. Has  mild ast/alt elev. Has high alk phos. - lactulose for hepatic encephalopathy enema as patient drowsy now. Will switch to PO when tolerating. - will get ultrasound of abdomen to rule out ascitis. Will get paracentesis if ascitis seen and will start cefotaxin to cover for SBO. No abx for now - neuro check - will check b12, folate,  TSH.  Volume overload - last ECHO 2013 was normal EF and normal overall. Patient now has b/l lower ext edema. Her hepatic encephalopathy could also be the result of congestion.  - will get ECHO to assess volume status.  - continue compression socks. Keep legs elevated - daily weights, strict i/o.  HTN - on amlodopine, lisinopril at home - continue these tomorrow.  DM II - states not on meds. cbg is high here in 300's - will do ssi here. Likely needs insulin  - check hgba1c  Depression - continue abilify and paxil tomorrow once patient tolerating po Insomnia - hold trazodone as she is lethargic.  Dispo: Disposition is deferred at this time, awaiting improvement of current medical problems. Anticipated discharge in approximately 1-2 day(s).   The patient does have a current PCP Barton Fanny, MD) and does need an [facility] hospital follow-up appointment after discharge.  The patient does have transportation limitations that hinder transportation to clinic appointments.  Signed: Dellia Nims, MD 09/08/2014, 6:51 PM

## 2014-09-08 NOTE — ED Notes (Signed)
IV team paged to meet pt on [employer] to assess for IV access.

## 2014-09-08 NOTE — ED Provider Notes (Signed)
CSN: [id number]     Arrival date & time 09/08/14  1206 History   First MD Initiated Contact with Patient 09/08/14 1333     Chief Complaint  Patient presents with  . Weakness     (Consider location/radiation/quality/duration/timing/severity/associated sxs/prior Treatment) Patient is a 68 y.o. female presenting with weakness. The history is provided by the patient. No language interpreter was used.  Weakness This is a new problem. The current episode started more than 1 month ago. The problem occurs constantly. The problem has been unchanged. Associated symptoms include fatigue and weakness. Nothing aggravates the symptoms. She has tried nothing for the symptoms. The treatment provided no relief.  Pt was at her Hepatitis clinic appointment.  Her brother reports pt was very sleepy and complains of weakness.  (concern about ammonia level)  Past Medical History  Diagnosis Date  . Anxiety   . Depression   . Cataract     had surgery for  . Diabetes mellitus   . Hypertension   . Arthritis     SHOULDER,KNEES,BACK   Past Surgical History  Procedure Laterality Date  . Cholecystectomy    . Colostomy    . Polypectomy    . Cataract extraction w/ intraocular lens  implant, bilateral     Family History  Problem Relation Age of Onset  . Stroke Mother   . Cancer Father   . Diabetes Sister   . Stroke Sister    History  Substance Use Topics  . Smoking status: Former Research scientist (life sciences)  . Smokeless tobacco: Never Used  . Alcohol Use: No   OB History    No data available     Review of Systems  Constitutional: Positive for fatigue.  Neurological: Positive for weakness.  Psychiatric/Behavioral: Positive for confusion and decreased concentration.  All other systems reviewed and are negative.     Allergies  Review of patient's allergies indicates no known allergies.  Home Medications   Prior to Admission medications   Medication Sig Start Date End Date Taking? Authorizing Provider   ABILIFY 10 MG tablet Take 10 mg by mouth daily.  09/01/14  Yes Historical Provider, MD  amLODipine (NORVASC) 10 MG tablet Take 10 mg by mouth daily.   Yes Historical Provider, MD  furosemide (LASIX) 40 MG tablet Take 40 mg by mouth daily.  09/01/14  Yes Historical Provider, MD  HARVONI 90-400 MG TABS Take 1 tablet by mouth daily.  08/18/14  Yes Historical Provider, MD  latanoprost (XALATAN) 0.005 % ophthalmic solution Place 1 drop into both eyes at bedtime.  09/02/14  Yes Historical Provider, MD  lisinopril (PRINIVIL,ZESTRIL) 10 MG tablet Take 10 mg by mouth daily.   Yes Historical Provider, MD  mirtazapine (REMERON) 15 MG tablet Take 15 mg by mouth at bedtime.   Yes Historical Provider, MD  oxybutynin (DITROPAN) 5 MG tablet Take 5 mg by mouth 2 (two) times daily.    Yes Historical Provider, MD  PARoxetine (PAXIL) 20 MG tablet Take 20 mg by mouth daily.   Yes Historical Provider, MD  traZODone (DESYREL) 50 MG tablet Take 25 mg by mouth at bedtime.   Yes Historical Provider, MD  lactulose (CHRONULAC) 10 GM/15ML solution Take 10 g by mouth daily.  09/08/14   Historical Provider, MD  montelukast (SINGULAIR) 10 MG tablet Take 1 tablet by mouth daily. 09/01/14   Historical Provider, MD   BP 138/65 mmHg  Pulse 61  Temp(Src) 98.4 F (36.9 C) (Oral)  Resp 20  Ht 5\' 2"  (1.575 m)  Wt 173 lb (78.472 kg)  BMI 31.63 kg/m2  SpO2 93% Physical Exam  Constitutional: She is oriented to person, place, and time. She appears well-developed and well-nourished.  HENT:  Head: Normocephalic and atraumatic.  Right Ear: External ear normal.  Mouth/Throat: Oropharynx is clear and moist.  Eyes: Conjunctivae are normal. Pupils are equal, round, and reactive to light.  Neck: Normal range of motion. Neck supple.  Cardiovascular: Normal rate, regular rhythm and normal heart sounds.   Pulmonary/Chest: Effort normal and breath sounds normal.  Abdominal: Soft.  Musculoskeletal: Normal range of motion.  Neurological: She  is oriented to person, place, and time. She has normal reflexes.  arousable but sleepy  Skin: Skin is warm.  Psychiatric:  sleepy  Nursing note and vitals reviewed.   ED Course  Procedures (including critical care time) Labs Review Labs Reviewed  CBC WITH DIFFERENTIAL - Abnormal; Notable for the following:    WBC 3.8 (*)    Hemoglobin 11.2 (*)    HCT 34.2 (*)    All other components within normal limits  COMPREHENSIVE METABOLIC PANEL    Imaging Review No results found.   EKG Interpretation None      MDM   Final diagnoses:  Altered mental status  Increased ammonia level     Admit to Internal Medicine   Fransico Meadow, PA-C 09/08/14 [place] Yao, MD 09/08/14 [phone]

## 2014-09-08 NOTE — ED Notes (Addendum)
Unable to complete NIH patient is lethargy patient needs constant verbal Stimul to follow commands.  EDP made aware.

## 2014-09-08 NOTE — ED Notes (Signed)
Patient transported to X-ray 

## 2014-09-08 NOTE — H&P (Signed)
  Date: 09/08/2014  Patient name: Carol Rubio  Medical record number: [id number]  Date of birth: 1946/03/21   I have seen and evaluated Davis Gourd and discussed their care with the Residency Team.   Assessment and Plan: I have seen and evaluated the patient as outlined above. I agree with the formulated Assessment and Plan as detailed in the residents' admission note, with the following changes:  68 yo F with hx of hepatitis C (on harvoni for last 6 weeks), as well as cirrhosis. She has had increasing forgetfullness per her brother for lthe last 3 months. Has been missing appts, has been losing car keys. Has also noted increasing build up of fluid in her legs for the last 6 months. She was seen in Hepatitis C clinic today, was sent to ED due to her worsening mental status, fatigue, lethargy.   Fhx- alzheimer's  Blood pressure 115/49, pulse 63, temperature 98.4 F (36.9 C), temperature source Oral, resp. rate 15, height $RemoveBe'5\' 2"'qbZXBIaHv$  (1.575 m), weight 78.472 kg (173 lb), SpO2 97 %. Eyes- no icterus, EOMI Mouth- no lesions Neck- non-tender, no LN Chest- cta cv- rrr abd- bs+, soft, non-tender, distended, no fluid wave appreciated. Liver edge is palpable.  extr- > 3+ edema to knees MSE- + person, place, president. Date is 07-09-09  Labs- WBC 3.8 H/h 11.2/34.2  Cr 0.63  AST 43 ALT 30    Alk P 257 INR 1.32 Ammonia 103  CXR- Bilateral interstitial opacity, favored congestive rather than infectious. CT head (-) acute  A/P Cirrhosis Hepatic Encephalopathy Hepatitis C Mental Status change Lethargy  Will treat her with lactulose.  Check abd u/s to eval for SBP/fluid Will consider palliative care/goals of care eval. I am not sure that her family can care for her any more at home. Will re-assess after her mental status improves.   Campbell Riches, MD 11/10/20155:25 PM

## 2014-09-08 NOTE — ED Notes (Signed)
Admitting physicians at bedside.

## 2014-09-08 NOTE — Progress Notes (Signed)
Pt arrived to 4N07. Alert and oriented x3. Pt placed on tele. Currently no IV access. ED RN stated that she would page IV team to place IV for pt. Pt oriented to room and unit. Call bell and phone within reach. Call bell on. Will continue to monitor.

## 2014-09-09 DIAGNOSIS — F0390 Unspecified dementia without behavioral disturbance: Secondary | ICD-10-CM | POA: Diagnosis not present

## 2014-09-09 DIAGNOSIS — E8770 Fluid overload, unspecified: Secondary | ICD-10-CM | POA: Diagnosis present

## 2014-09-09 DIAGNOSIS — R5383 Other fatigue: Secondary | ICD-10-CM | POA: Diagnosis not present

## 2014-09-09 DIAGNOSIS — B182 Chronic viral hepatitis C: Secondary | ICD-10-CM | POA: Diagnosis present

## 2014-09-09 DIAGNOSIS — K5900 Constipation, unspecified: Secondary | ICD-10-CM | POA: Diagnosis present

## 2014-09-09 DIAGNOSIS — R260 Ataxic gait: Secondary | ICD-10-CM | POA: Diagnosis not present

## 2014-09-09 DIAGNOSIS — I5030 Unspecified diastolic (congestive) heart failure: Secondary | ICD-10-CM | POA: Diagnosis not present

## 2014-09-09 DIAGNOSIS — Z823 Family history of stroke: Secondary | ICD-10-CM | POA: Diagnosis not present

## 2014-09-09 DIAGNOSIS — Z87891 Personal history of nicotine dependence: Secondary | ICD-10-CM | POA: Diagnosis not present

## 2014-09-09 DIAGNOSIS — K7290 Hepatic failure, unspecified without coma: Secondary | ICD-10-CM | POA: Diagnosis not present

## 2014-09-09 DIAGNOSIS — D72819 Decreased white blood cell count, unspecified: Secondary | ICD-10-CM | POA: Diagnosis not present

## 2014-09-09 DIAGNOSIS — M19019 Primary osteoarthritis, unspecified shoulder: Secondary | ICD-10-CM | POA: Diagnosis present

## 2014-09-09 DIAGNOSIS — G9340 Encephalopathy, unspecified: Secondary | ICD-10-CM | POA: Diagnosis not present

## 2014-09-09 DIAGNOSIS — E119 Type 2 diabetes mellitus without complications: Secondary | ICD-10-CM | POA: Diagnosis not present

## 2014-09-09 DIAGNOSIS — M1991 Primary osteoarthritis, unspecified site: Secondary | ICD-10-CM | POA: Diagnosis present

## 2014-09-09 DIAGNOSIS — D61818 Other pancytopenia: Secondary | ICD-10-CM | POA: Diagnosis present

## 2014-09-09 DIAGNOSIS — D696 Thrombocytopenia, unspecified: Secondary | ICD-10-CM | POA: Diagnosis not present

## 2014-09-09 DIAGNOSIS — M170 Bilateral primary osteoarthritis of knee: Secondary | ICD-10-CM | POA: Diagnosis present

## 2014-09-09 DIAGNOSIS — K7460 Unspecified cirrhosis of liver: Secondary | ICD-10-CM | POA: Diagnosis not present

## 2014-09-09 DIAGNOSIS — M6281 Muscle weakness (generalized): Secondary | ICD-10-CM | POA: Diagnosis not present

## 2014-09-09 DIAGNOSIS — I10 Essential (primary) hypertension: Secondary | ICD-10-CM | POA: Diagnosis not present

## 2014-09-09 DIAGNOSIS — G4700 Insomnia, unspecified: Secondary | ICD-10-CM | POA: Diagnosis present

## 2014-09-09 DIAGNOSIS — I509 Heart failure, unspecified: Secondary | ICD-10-CM | POA: Diagnosis not present

## 2014-09-09 DIAGNOSIS — E8779 Other fluid overload: Secondary | ICD-10-CM | POA: Diagnosis not present

## 2014-09-09 DIAGNOSIS — Z833 Family history of diabetes mellitus: Secondary | ICD-10-CM | POA: Diagnosis not present

## 2014-09-09 DIAGNOSIS — Z9049 Acquired absence of other specified parts of digestive tract: Secondary | ICD-10-CM | POA: Diagnosis present

## 2014-09-09 DIAGNOSIS — F419 Anxiety disorder, unspecified: Secondary | ICD-10-CM | POA: Diagnosis present

## 2014-09-09 DIAGNOSIS — B1920 Unspecified viral hepatitis C without hepatic coma: Secondary | ICD-10-CM | POA: Diagnosis not present

## 2014-09-09 DIAGNOSIS — Z809 Family history of malignant neoplasm, unspecified: Secondary | ICD-10-CM | POA: Diagnosis not present

## 2014-09-09 DIAGNOSIS — R41841 Cognitive communication deficit: Secondary | ICD-10-CM | POA: Diagnosis not present

## 2014-09-09 DIAGNOSIS — R531 Weakness: Secondary | ICD-10-CM | POA: Diagnosis not present

## 2014-09-09 DIAGNOSIS — F329 Major depressive disorder, single episode, unspecified: Secondary | ICD-10-CM | POA: Diagnosis present

## 2014-09-09 DIAGNOSIS — I517 Cardiomegaly: Secondary | ICD-10-CM | POA: Diagnosis not present

## 2014-09-09 DIAGNOSIS — Z820 Family history of epilepsy and other diseases of the nervous system: Secondary | ICD-10-CM | POA: Diagnosis not present

## 2014-09-09 DIAGNOSIS — Z794 Long term (current) use of insulin: Secondary | ICD-10-CM | POA: Diagnosis not present

## 2014-09-09 LAB — CBC
HCT: 31.3 % — ABNORMAL LOW (ref 36.0–46.0)
Hemoglobin: 10.3 g/dL — ABNORMAL LOW (ref 12.0–15.0)
MCH: 28.1 pg (ref 26.0–34.0)
MCHC: 32.9 g/dL (ref 30.0–36.0)
MCV: 85.5 fL (ref 78.0–100.0)
Platelets: 90 10*3/uL — ABNORMAL LOW (ref 150–400)
RBC: 3.66 MIL/uL — ABNORMAL LOW (ref 3.87–5.11)
RDW: 14.7 % (ref 11.5–15.5)
WBC: 3.6 10*3/uL — ABNORMAL LOW (ref 4.0–10.5)

## 2014-09-09 LAB — GLUCOSE, CAPILLARY
Glucose-Capillary: 116 mg/dL — ABNORMAL HIGH (ref 70–99)
Glucose-Capillary: 137 mg/dL — ABNORMAL HIGH (ref 70–99)
Glucose-Capillary: 160 mg/dL — ABNORMAL HIGH (ref 70–99)
Glucose-Capillary: 173 mg/dL — ABNORMAL HIGH (ref 70–99)
Glucose-Capillary: 175 mg/dL — ABNORMAL HIGH (ref 70–99)
Glucose-Capillary: 190 mg/dL — ABNORMAL HIGH (ref 70–99)

## 2014-09-09 LAB — BASIC METABOLIC PANEL
Anion gap: 10 (ref 5–15)
BUN: 11 mg/dL (ref 6–23)
CO2: 26 mEq/L (ref 19–32)
Calcium: 8.8 mg/dL (ref 8.4–10.5)
Chloride: 107 mEq/L (ref 96–112)
Creatinine, Ser: 0.59 mg/dL (ref 0.50–1.10)
GFR calc Af Amer: >90 mL/min (ref >90)
GFR calc non Af Amer: >90 mL/min (ref >90)
Glucose, Bld: 132 mg/dL — ABNORMAL HIGH (ref 70–99)
Potassium: 3.4 mEq/L — ABNORMAL LOW (ref 3.7–5.3)
Sodium: 143 mEq/L (ref 137–147)

## 2014-09-09 LAB — HEMOGLOBIN A1C
Hgb A1c MFr Bld: 6.6 % — ABNORMAL HIGH (ref <5.7)
Mean Plasma Glucose: 143 mg/dL — ABNORMAL HIGH (ref <117)

## 2014-09-09 LAB — TSH: TSH: 0.337 u[IU]/mL — ABNORMAL LOW (ref 0.350–4.500)

## 2014-09-09 LAB — FOLATE: Folate: 14.9 ng/mL

## 2014-09-09 LAB — VITAMIN B12: Vitamin B-12: 838 pg/mL (ref 211–911)

## 2014-09-09 MED ORDER — LISINOPRIL 10 MG PO TABS
10.0000 mg | ORAL_TABLET | Freq: Every day | ORAL | Status: DC
  Administered 2014-09-10: 10 mg via ORAL
  Filled 2014-09-09: qty 1

## 2014-09-09 MED ORDER — LEDIPASVIR-SOFOSBUVIR 90-400 MG PO TABS
1.0000 | ORAL_TABLET | Freq: Every day | ORAL | Status: DC
  Administered 2014-09-09 – 2014-09-11 (×3): 1 via ORAL
  Filled 2014-09-09: qty 1

## 2014-09-09 MED ORDER — AMLODIPINE BESYLATE 10 MG PO TABS
10.0000 mg | ORAL_TABLET | Freq: Every day | ORAL | Status: DC
  Administered 2014-09-10: 10 mg via ORAL
  Filled 2014-09-09: qty 1

## 2014-09-09 MED ORDER — INSULIN ASPART 100 UNIT/ML ~~LOC~~ SOLN
0.0000 [IU] | Freq: Three times a day (TID) | SUBCUTANEOUS | Status: DC
  Administered 2014-09-09 – 2014-09-10 (×3): 2 [IU] via SUBCUTANEOUS
  Administered 2014-09-10 – 2014-09-11 (×3): 1 [IU] via SUBCUTANEOUS
  Administered 2014-09-11: 2 [IU] via SUBCUTANEOUS

## 2014-09-09 MED ORDER — TRAZODONE HCL 50 MG PO TABS
25.0000 mg | ORAL_TABLET | Freq: Every day | ORAL | Status: DC
  Administered 2014-09-09 – 2014-09-10 (×2): 25 mg via ORAL
  Filled 2014-09-09 (×2): qty 1

## 2014-09-09 MED ORDER — PNEUMOCOCCAL VAC POLYVALENT 25 MCG/0.5ML IJ INJ
0.5000 mL | INJECTION | INTRAMUSCULAR | Status: AC
  Administered 2014-09-10: 0.5 mL via INTRAMUSCULAR
  Filled 2014-09-09: qty 0.5

## 2014-09-09 MED ORDER — POTASSIUM CHLORIDE CRYS ER 20 MEQ PO TBCR
40.0000 meq | EXTENDED_RELEASE_TABLET | Freq: Once | ORAL | Status: AC
  Administered 2014-09-09: 40 meq via ORAL
  Filled 2014-09-09: qty 2

## 2014-09-09 NOTE — Progress Notes (Signed)
  Date: 09/09/2014  Patient name: Carol Rubio  Medical record number: [id number]  Date of birth: 1946/06/05   This patient has been seen and the plan of care was discussed with the house staff. Please see their note for complete details. I concur with their findings with the following additions/corrections:  Pt is more alert and conversant.   Blood pressure 115/49, pulse 101, temperature 98.8 F (37.1 C), temperature source Oral, resp. rate 16, height 5\' 2"  (1.575 m), weight 77.202 kg (170 lb 3.2 oz), SpO2 97 %. MSE- person/place/date. She has some difficulty relating more detailed elements of her PMHx.   Labs-  U/s no ascites.  Hepatic Encephalopathy ? Early dementia  She appears to be improving Await her family for input as to her baseline.  As well, will need input as to if she is able to perform her ADLs, can live alone.   Campbell Riches, MD 09/09/2014, 10:13 AM

## 2014-09-09 NOTE — Plan of Care (Signed)
Problem: Phase II Progression Outcomes Goal: Progress activity as tolerated unless otherwise ordered Outcome: Completed/Met Date Met:  09/09/14 Goal: Discharge plan established Outcome: Completed/Met Date Met:  09/09/14 Goal: Vital signs remain stable Outcome: Completed/Met Date Met:  09/09/14 Goal: IV changed to normal saline lock Outcome: Completed/Met Date Met:  09/09/14 Goal: Obtain order to discontinue catheter if appropriate Outcome: Not Applicable Date Met:  09/09/14  Problem: Phase III Progression Outcomes Goal: Pain controlled on oral analgesia Outcome: Completed/Met Date Met:  09/09/14 Goal: Activity at appropriate level-compared to baseline (UP IN CHAIR FOR HEMODIALYSIS)  Outcome: Completed/Met Date Met:  09/09/14 Goal: Voiding independently Outcome: Progressing Goal: IV/normal saline lock discontinued Outcome: Completed/Met Date Met:  09/09/14 Goal: Foley discontinued Outcome: Not Applicable Date Met:  09/09/14 Goal: Discharge plan remains appropriate-arrangements made Outcome: Completed/Met Date Met:  09/09/14     

## 2014-09-09 NOTE — Progress Notes (Signed)
Pt complained of lower back pain and wanted a sleep aid.  MD notified.  MD stated to try heating packs for back pain.  Will continue to monitor.    Fredrich Romans, RN 09/09/2014 10:58 PM

## 2014-09-09 NOTE — Progress Notes (Signed)
Subjective: Patient states that she is feeling much better since yesterday. She states that after she was given the lactulose enema, she had approximately 4-5 BMs. She denies any abdominal pain, N/V, chest pain, or SOB. She slept well last night and denies any acute complaints this morning. She endirses some DOE for the past 3 months. She reports that she has gained about 30 lbs in that time period as well. Finally, she complains of some PND during that time period.  Objective: Vital signs in last 24 hours: Filed Vitals:   09/09/14 0321 09/09/14 0351 09/09/14 0640 09/09/14 0930  BP: 97/49  105/39 115/49  Pulse: 65  65 101  Temp: 98.4 F (36.9 C) 98.4 F (36.9 C) 98.4 F (36.9 C) 98.8 F (37.1 C)  TempSrc: Oral  Oral Oral  Resp: 16  16 16   Height:      Weight:   77.202 kg (170 lb 3.2 oz)   SpO2: 93%  93% 97%      Intake/Output Summary (Last 24 hours) at 09/09/14 1000 Last data filed at 09/09/14 0839  Gross per 24 hour  Intake    120 ml  Output      0 ml  Net    120 ml   BP 115/49 mmHg  Pulse 101  Temp(Src) 98.8 F (37.1 C) (Oral)  Resp 16  Ht 5\' 2"  (1.575 m)  Wt 77.202 kg (170 lb 3.2 oz)  BMI 31.12 kg/m2  SpO2 97%  General Appearance:    Alert, cooperative, no distress, appears stated age  Head:    Normocephalic, without obvious abnormality, atraumatic  Eyes:    PERRL, conjunctiva/corneas clear, EOM's intact, fundi    benign, both eyes  Ears:    Normal TM's and external ear canals, both ears  Nose:   Nares normal, septum midline, mucosa normal, no drainage    or sinus tenderness  Throat:   Lips, mucosa, and tongue normal; teeth and gums normal  Neck:   Supple, symmetrical, trachea midline, no adenopathy;    thyroid:  no enlargement/tenderness/nodules; no carotid   bruit or JVD  Back:     Symmetric, no curvature, ROM normal, no CVA tenderness  Lungs:     Clear to auscultation bilaterally, respirations unlabored  Chest Wall:    No tenderness or deformity   Heart:     Regular rate and rhythm, S1 and S2 normal, no murmur, rub   or gallop  Breast Exam:    No tenderness, masses, or nipple abnormality  Abdomen:     Soft, non-tender, distended but no fluid wave, bowel sounds active all four quadrants,    no masses, no organomegaly        Extremities:   Extremities normal, atraumatic, no cyanosis. 2+ pitting edema to knees b/l  Pulses:   2+ and symmetric all extremities  Skin:   Skin color, texture, turgor normal, no rashes or lesions  Lymph nodes:   Cervical, supraclavicular, and axillary nodes normal  Neurologic:   CNII-XII intact, normal strength, sensation and reflexes    throughout   Lab Results:  Basic Metabolic Panel:  Recent Labs  09/08/14 1310 09/09/14 0703  NA 138 143  K 3.7 3.4*  CL 100 107  CO2 25 26  GLUCOSE 338* 132*  BUN 13 11  CREATININE 0.63 0.59  CALCIUM 9.1 8.8   Liver Function Tests:  Recent Labs  09/08/14 1310  AST 43*  ALT 30  ALKPHOS 257*  BILITOT 0.7  PROT  8.3  ALBUMIN 2.6*    Recent Labs  09/08/14 1347  AMMONIA 103*   CBC:  Recent Labs  09/08/14 1310 09/09/14 0703  WBC 3.8* 3.6*  NEUTROABS 2.1  --   HGB 11.2* 10.3*  HCT 34.2* 31.3*  MCV 87.2 85.5  PLT 87* 90*    Recent Labs  09/08/14 1956 09/08/14 2347 09/09/14 0324 09/09/14 1126  GLUCAP 172* 137* 116* 173*   Coagulation:  Recent Labs  09/08/14 1310  LABPROT 16.5*  INR 1.32   Urinalysis:  Recent Labs  09/08/14 1601  COLORURINE YELLOW  LABSPEC 1.020  PHURINE 6.5  GLUCOSEU >1000*  HGBUR SMALL*  BILIRUBINUR NEGATIVE  KETONESUR NEGATIVE  PROTEINUR NEGATIVE  UROBILINOGEN 2.0*  NITRITE NEGATIVE  LEUKOCYTESUR SMALL*    Studies/Results: Dg Chest 2 View  09/08/2014   CLINICAL DATA:  Altered mental status.  EXAM: CHEST  2 VIEW  COMPARISON:  06/16/2013  FINDINGS: Borderline cardiomegaly. Negative aortic and hilar contours. Diffuse interstitial coarsening with cephalized blood flow. No Kerley lines or pleural effusion. No  focal opacity confirmed on both views.  IMPRESSION: Bilateral interstitial opacity, favored congestive rather than infectious.   Electronically Signed   By: Jorje Guild M.D.   On: 09/08/2014 16:05   Ct Head Wo Contrast  09/08/2014   CLINICAL DATA:  Insomnia.  Weakness for 3-4 weeks.  Memory problems.  EXAM: CT HEAD WITHOUT CONTRAST  TECHNIQUE: Contiguous axial images were obtained from the base of the skull through the vertex without intravenous contrast.  COMPARISON:  Brain MRI, 05/03/2012 and head CT, 10/05/2011.  FINDINGS: Ventricles are normal in configuration. There is ventricular and sulcal enlargement reflecting mild atrophy. No hydrocephalus.  No parenchymal masses or mass effect. Patchy areas of white matter hypoattenuation are noted consistent with mild chronic microvascular ischemic change.  There is no evidence of a cortical infarct.  There are no extra-axial masses or abnormal fluid collections.  No intracranial hemorrhage.  Visualized sinuses and mastoid air cells are clear. No skull lesion.  IMPRESSION: 1. No acute intracranial abnormalities. 2. Mild atrophy and chronic microvascular ischemic change.   Electronically Signed   By: Lajean Manes M.D.   On: 09/08/2014 16:17   US Abdomen Limited  09/08/2014   CLINICAL DATA:  Hepatic encephalopathy.  EXAM: LIMITED ABDOMEN ULTRASOUND FOR ASCITES  TECHNIQUE: Limited ultrasound survey for ascites was performed in all four abdominal quadrants.  COMPARISON:  Abdominal ultrasound 08/04/2014.  FINDINGS: No ascites is identified.  IMPRESSION: Negative for ascites.   Electronically Signed   By: Inge Rise M.D.   On: 09/08/2014 23:29   Medications: I have reviewed the patient's current medications. Scheduled Meds: . ARIPiprazole  10 mg Oral Daily  . enoxaparin (LOVENOX) injection  40 mg Subcutaneous Q24H  . insulin aspart  0-9 Units Subcutaneous TID WC  . lactulose  30 g Oral Daily  . latanoprost  1 drop Both Eyes QHS  . montelukast  10 mg  Oral Daily  . oxybutynin  5 mg Oral BID  . PARoxetine  20 mg Oral Daily   Assessment/Plan: Active Problems:   Weakness   Acute encephalopathy  68 yo female with Hep C, cirrhosis, dementia here with acute encephalopathy most likely 2/2 to hepatic cause/volume overload  Hepatic Encephalopathy - AUS was negative for ascites, so is unlikely d/t SBP. Resolving after lactulose enema last night and 4/5 BMs.   Volume Status - Last ECHO 2013 showed normal EF and normal overall. Pt now has b/l lower ext  edema and complains of a 3 mo hx of SOB, DOE, PND, and weight gain. Volume overload could also be contributing to her hepatic congestion/encephalopathy.  - ECHO to be done today - Compression socks/keep legs elevated - daily weights/strict I/Os  HTN - on amlodipine, lisinopril at home  -continue meds as above  DM II - currently unmedicated. CBG is still elevated but better (338 -> 132).   - continue Insulin qac/qhs  - will likely need outpt f/u regarding management of DM II in the future  - HgbA1c pending  Depression - continue abilify and paxil  Hepatitis C - continue home Harvoni  Insomnia - continue to hold trazodone as she is still lethargic.  Dispo: Disposition is deferred at this time, awaiting improvement of current medical problems.  Anticipated discharge in approximately 2 day(s).   The patient does have a current PCP Barton Fanny, MD) and does need an [facility] hospital follow-up appointment after discharge.  The patient does have transportation limitations that hinder transportation to clinic appointments.  .Services Needed at time of discharge: Y = Yes, Blank = No PT: Y  OT:   RN:   Equipment:   Other:     LOS: 1 day   Farrell Ours, Med Student 09/09/2014, 10:00 AM

## 2014-09-09 NOTE — Progress Notes (Signed)
Subjective: Doing well. Awake. Doesn't have any complaint other than leg swelling. States that at home was getting SOB climbing stairs. Denies any cp/sob/n/v. Had 4-5 bm's overnight with lactulose.  Objective: Vital signs in last 24 hours: Filed Vitals:   09/09/14 0321 09/09/14 0351 09/09/14 0640 09/09/14 0930  BP: 97/49  105/39 115/49  Pulse: 65  65 101  Temp: 98.4 F (36.9 C) 98.4 F (36.9 C) 98.4 F (36.9 C) 98.8 F (37.1 C)  TempSrc: Oral  Oral Oral  Resp: _0 Height:      Weight:   77.202 kg (170 lb 3.2 oz)   SpO2: 93%  93% 97%   Weight change:   Intake/Output Summary (Last 24 hours) at 09/09/14 1128 Last data filed at 09/09/14 0839  Gross per 24 hour  Intake    120 ml  Output      0 ml  Net    120 ml   Vitals reviewed. General: resting in bed, NAD. Awake, no somnolence noted today.  HEENT: PERRL, EOMI, no scleral icterus Cardiac: RRR, no rubs, murmurs or gallops Pulm: clear to auscultation bilaterally, no wheezes, rales, or rhonchi Abd: soft, nontender, has distension. BS present Ext: warm and well perfused, no pedal edema Neuro: alert and oriented X4. Able to tell [place] president's name. Has some circumferential thought process when asked about detailed questions. cranial nerves II-XII grossly intact, strength and sensation to light touch equal in bilateral upper and lower extremities. No asterixis observed today.   Lab Results: Basic Metabolic Panel:  Recent Labs Lab 09/08/14 1310 09/09/14 0703  NA 138 143  K 3.7 3.4*  CL 100 107  CO2 25 26  GLUCOSE 338* 132*  BUN 13 11  CREATININE 0.63 0.59  CALCIUM 9.1 8.8   Liver Function Tests:  Recent Labs Lab 09/08/14 1310  AST 43*  ALT 30  ALKPHOS 257*  BILITOT 0.7  PROT 8.3  ALBUMIN 2.6*   No results for input(s): LIPASE, AMYLASE in the last 168 hours.  Recent Labs Lab 09/08/14 1347  AMMONIA 103*   CBC:  Recent Labs Lab 09/08/14 1310 09/09/14 0703  WBC 3.8* 3.6*  NEUTROABS 2.1  --    HGB 11.2* 10.3*  HCT 34.2* 31.3*  MCV 87.2 85.5  PLT 87* 90*   Cardiac Enzymes: No results for input(s): CKTOTAL, CKMB, CKMBINDEX, TROPONINI in the last 168 hours. BNP: No results for input(s): PROBNP in the last 168 hours. D-Dimer: No results for input(s): DDIMER in the last 168 hours. CBG:  Recent Labs Lab 09/08/14 1956 09/08/14 2347 09/09/14 0324  GLUCAP 172* 137* 116*   Hemoglobin A1C: No results for input(s): HGBA1C in the last 168 hours. Fasting Lipid Panel: No results for input(s): CHOL, HDL, LDLCALC, TRIG, CHOLHDL, LDLDIRECT in the last 168 hours. Thyroid Function Tests: No results for input(s): TSH, T4TOTAL, FREET4, T3FREE, THYROIDAB in the last 168 hours. Coagulation:  Recent Labs Lab 09/08/14 1310  LABPROT 16.5*  INR 1.32   Anemia Panel: No results for input(s): VITAMINB12, FOLATE, FERRITIN, TIBC, IRON, RETICCTPCT in the last 168 hours. Urine Drug Screen: Drugs of Abuse  No results found for: LABOPIA, COCAINSCRNUR, LABBENZ, AMPHETMU, THCU, LABBARB  Alcohol Level: No results for input(s): ETH in the last 168 hours. Urinalysis:  Recent Labs Lab 09/08/14 1601  COLORURINE YELLOW  LABSPEC 1.020  PHURINE 6.5  GLUCOSEU >1000*  HGBUR SMALL*  BILIRUBINUR NEGATIVE  KETONESUR NEGATIVE  PROTEINUR NEGATIVE  UROBILINOGEN 2.0*  NITRITE NEGATIVE  LEUKOCYTESUR SMALL*   Misc. Labs:  Micro Results: No results found for this or any previous visit (from the past 240 hour(s)). Studies/Results: Dg Chest 2 View  09/08/2014   CLINICAL DATA:  Altered mental status.  EXAM: CHEST  2 VIEW  COMPARISON:  06/16/2013  FINDINGS: Borderline cardiomegaly. Negative aortic and hilar contours. Diffuse interstitial coarsening with cephalized blood flow. No Kerley lines or pleural effusion. No focal opacity confirmed on both views.  IMPRESSION: Bilateral interstitial opacity, favored congestive rather than infectious.   Electronically Signed   By: Jorje Guild M.D.   On:  09/08/2014 16:05   Ct Head Wo Contrast  09/08/2014   CLINICAL DATA:  Insomnia.  Weakness for 3-4 weeks.  Memory problems.  EXAM: CT HEAD WITHOUT CONTRAST  TECHNIQUE: Contiguous axial images were obtained from the base of the skull through the vertex without intravenous contrast.  COMPARISON:  Brain MRI, 05/03/2012 and head CT, 10/05/2011.  FINDINGS: Ventricles are normal in configuration. There is ventricular and sulcal enlargement reflecting mild atrophy. No hydrocephalus.  No parenchymal masses or mass effect. Patchy areas of white matter hypoattenuation are noted consistent with mild chronic microvascular ischemic change.  There is no evidence of a cortical infarct.  There are no extra-axial masses or abnormal fluid collections.  No intracranial hemorrhage.  Visualized sinuses and mastoid air cells are clear. No skull lesion.  IMPRESSION: 1. No acute intracranial abnormalities. 2. Mild atrophy and chronic microvascular ischemic change.   Electronically Signed   By: Lajean Manes M.D.   On: 09/08/2014 16:17   US Abdomen Limited  09/08/2014   CLINICAL DATA:  Hepatic encephalopathy.  EXAM: LIMITED ABDOMEN ULTRASOUND FOR ASCITES  TECHNIQUE: Limited ultrasound survey for ascites was performed in all four abdominal quadrants.  COMPARISON:  Abdominal ultrasound 08/04/2014.  FINDINGS: No ascites is identified.  IMPRESSION: Negative for ascites.   Electronically Signed   By: Inge Rise M.D.   On: 09/08/2014 23:29   Medications: I have reviewed the patient's current medications. Scheduled Meds: . ARIPiprazole  10 mg Oral Daily  . enoxaparin (LOVENOX) injection  40 mg Subcutaneous Q24H  . insulin aspart  0-9 Units Subcutaneous TID WC  . lactulose  30 g Oral Daily  . latanoprost  1 drop Both Eyes QHS  . montelukast  10 mg Oral Daily  . oxybutynin  5 mg Oral BID  . PARoxetine  20 mg Oral Daily  . [START ON 09/10/2014] pneumococcal 23 valent vaccine  0.5 mL Intramuscular Tomorrow-1000   Continuous  Infusions:  PRN Meds:. Assessment/Plan: Active Problems:   Weakness   Acute encephalopathy  68 yo female with Hep C, cirrhosis, dementia here with acute encephalopathy most likely 2/2 to hepatic cause.   Hepatic encephalopathy - ammonia 103. Other causes unlikely as CT negative. UA negative. Electrolytes are normal. Has mild ast/alt elev. Has high alk phos. - received lactulose for hepatic encephalopathy enema as patient was drowsy. Will switch to PO today. - limited ab u/s showed no ascites.  - neuro check - will check b12, folate, TSH to r/o other causes of encephalopathy.   Early Dementia - unclear etiology. Will get labs to rule out reversible causes as above. Increasing forgetfulness. Hasn't affected adl's yet. Will get PT eval to assess her dispo needs.  Volume overload - on exam has b/l lower ext edema and also states sob with exertion. Like has chf. Last echo on 2013 was normal. Vascular congestion could also cause hepatic encehapopathy. - will get echo  today  Hep C - on treatment with harvoni.brother brought her home med here. Will continue giving this.  Depression - cont abilify, paxil.   HTN - on amlodopine, lisinopril at home - continue these   Dispo: Disposition is deferred at this time, awaiting improvement of current medical problems.  Anticipated discharge in approximately 1-2 day(s).   The patient does have a current PCP Barton Fanny, MD) and does need an [facility] hospital follow-up appointment after discharge.  The patient does have transportation limitations that hinder transportation to clinic appointments.  .Services Needed at time of discharge: Y = Yes, Blank = No PT:   OT:   RN:   Equipment:   Other:     LOS: 1 day   Dellia Nims, MD 09/09/2014, 11:28 AM

## 2014-09-09 NOTE — Progress Notes (Signed)
UR completed 

## 2014-09-09 NOTE — Progress Notes (Signed)
Echo Lab  2D Echocardiogram completed.  [place], RDCS 09/09/2014 3:23 PM

## 2014-09-10 DIAGNOSIS — D696 Thrombocytopenia, unspecified: Secondary | ICD-10-CM

## 2014-09-10 DIAGNOSIS — D72819 Decreased white blood cell count, unspecified: Secondary | ICD-10-CM

## 2014-09-10 DIAGNOSIS — I5030 Unspecified diastolic (congestive) heart failure: Secondary | ICD-10-CM

## 2014-09-10 DIAGNOSIS — K7290 Hepatic failure, unspecified without coma: Secondary | ICD-10-CM | POA: Diagnosis not present

## 2014-09-10 LAB — GLUCOSE, CAPILLARY
Glucose-Capillary: 164 mg/dL — ABNORMAL HIGH (ref 70–99)
Glucose-Capillary: 140 mg/dL — ABNORMAL HIGH (ref 70–99)
Glucose-Capillary: 142 mg/dL — ABNORMAL HIGH (ref 70–99)
Glucose-Capillary: 154 mg/dL — ABNORMAL HIGH (ref 70–99)
Glucose-Capillary: 81 mg/dL (ref 70–99)

## 2014-09-10 LAB — BASIC METABOLIC PANEL
Anion gap: 13 (ref 5–15)
BUN: 12 mg/dL (ref 6–23)
CO2: 22 mEq/L (ref 19–32)
Calcium: 8.5 mg/dL (ref 8.4–10.5)
Chloride: 104 mEq/L (ref 96–112)
Creatinine, Ser: 0.57 mg/dL (ref 0.50–1.10)
GFR calc Af Amer: >90 mL/min (ref >90)
GFR calc non Af Amer: >90 mL/min (ref >90)
Glucose, Bld: 168 mg/dL — ABNORMAL HIGH (ref 70–99)
Potassium: 4.5 mEq/L (ref 3.7–5.3)
Sodium: 139 mEq/L (ref 137–147)

## 2014-09-10 LAB — CBC
HCT: 52.5 % — ABNORMAL HIGH (ref 36.0–46.0)
HCT: 36.1 % (ref 36.0–46.0)
Hemoglobin: 17.2 g/dL — ABNORMAL HIGH (ref 12.0–15.0)
Hemoglobin: 11.8 g/dL — ABNORMAL LOW (ref 12.0–15.0)
MCH: 28.4 pg (ref 26.0–34.0)
MCH: 28.6 pg (ref 26.0–34.0)
MCHC: 32.8 g/dL (ref 30.0–36.0)
MCHC: 32.7 g/dL (ref 30.0–36.0)
MCV: 86.6 fL (ref 78.0–100.0)
MCV: 87.6 fL (ref 78.0–100.0)
Platelets: 32 10*3/uL — ABNORMAL LOW (ref 150–400)
Platelets: 72 10*3/uL — ABNORMAL LOW (ref 150–400)
RBC: 6.06 MIL/uL — ABNORMAL HIGH (ref 3.87–5.11)
RBC: 4.12 MIL/uL (ref 3.87–5.11)
RDW: 14.9 % (ref 11.5–15.5)
RDW: 14.8 % (ref 11.5–15.5)
WBC: 1.9 10*3/uL — ABNORMAL LOW (ref 4.0–10.5)
WBC: 4.2 10*3/uL (ref 4.0–10.5)

## 2014-09-10 MED ORDER — CARVEDILOL 3.125 MG PO TABS
3.1250 mg | ORAL_TABLET | Freq: Two times a day (BID) | ORAL | Status: DC
  Administered 2014-09-10 – 2014-09-11 (×3): 3.125 mg via ORAL
  Filled 2014-09-10 (×3): qty 1

## 2014-09-10 NOTE — Progress Notes (Signed)
Occupational Therapy Evaluation Patient Details Name: Carol Rubio MRN: [id number] DOB: [birth year] Today's Date: 09/10/2014    History of Present Illness Carol Rubio is a 68 y.o. female admitted 09/08/14 for lethargy. PMH of DM, dementia, HTN, Hep C, cirrhosis, LE edema. Pt was seen at Hep C clinic and sent to ED for lethargy.    Clinical Impression   PTA pt lived at home with her older sister and reports that she was independent with ADLs, however pt's brother reports that she was "crawling" up the stairs at home. Pt is limited by weakness and lives in a split-level, which is inaccessible for pt. Pt also requires assist to reach Bil LEs due to mild LE edema and reported abdominal pain. Pt will benefit from ST Rehab at SNF to increase strength and endurance to navigate home environment. Pt will also benefit from acute OT to address independence with functional mobility and ADLs.     Follow Up Recommendations  SNF;Supervision/Assistance - 24 hour    Equipment Recommendations  Tub/shower bench;3 in 1 bedside comode    Recommendations for Other Services       Precautions / Restrictions Precautions Precautions: Fall Restrictions Weight Bearing Restrictions: No      Mobility Bed Mobility Overal bed mobility: Needs Assistance Bed Mobility: Supine to Sit     Supine to sit: Min assist;HOB elevated     General bed mobility comments: VC's for sequencing and hand held assist to elevate trunk off bed. Use of bed rail  Transfers Overall transfer level: Needs assistance Equipment used: Rolling walker (2 wheeled) Transfers: Sit to/from Stand Sit to Stand: Min guard         General transfer comment: Min guard for safety and balance. Pt moves slowly and reports mild dizziness upon initial standing.          ADL Overall ADL's : Needs assistance/impaired Eating/Feeding: Independent;Sitting   Grooming: Min guard;Standing   Upper Body Bathing: Set up;Sitting   Lower Body  Bathing: Minimal assistance;Sit to/from stand   Upper Body Dressing : Set up;Sitting   Lower Body Dressing: Moderate assistance;Sit to/from stand   Toilet Transfer: Min guard;Ambulation;RW Toilet Transfer Details (indicate cue type and reason): pt with slow ambulation to bathroom Toileting- Clothing Manipulation and Hygiene: Min guard;Sit to/from stand Toileting - Clothing Manipulation Details (indicate cue type and reason): use of grab bar   Tub/Shower Transfer Details (indicate cue type and reason): pt reports difficulty getting into tub at home Functional mobility during ADLs: Min guard;Rolling walker General ADL Comments: Pt presents with LE edema making it difficult to reach Bil feet for LB ADLs. Pt moves slowly and is limited by generalized weakness.      Vision  Pt wears glasses at all times and reports no change from baseline.                    Perception Perception Perception Tested?: No   Praxis Praxis Praxis tested?: Within functional limits    Pertinent Vitals/Pain Pain Assessment: 0-10 Pain Score: 3  Pain Location: lower back Pain Descriptors / Indicators: Sore Pain Intervention(s): Monitored during session     Hand Dominance Right   Extremity/Trunk Assessment Upper Extremity Assessment Upper Extremity Assessment: Generalized weakness   Lower Extremity Assessment Lower Extremity Assessment: Generalized weakness   Cervical / Trunk Assessment Cervical / Trunk Assessment: Normal   Communication Communication Communication: No difficulties   Cognition Arousal/Alertness: Awake/alert Behavior During Therapy: WFL for tasks assessed/performed Overall Cognitive Status: Within Functional Limits  for tasks assessed       Memory: Decreased short-term memory                        Home Living Family/patient expects to be discharged to:: Private residence Living Arrangements: Other (Comment) (lives with sister (in her 75's)) Available Help at  Discharge: Family;Available PRN/intermittently Type of Home: House Home Access: Stairs to enter [employer] of Steps: 4 in the front; none in the back (ground level)   Home Layout: Multi-level Alternate Level Stairs-Number of Steps: ~12   Bathroom Shower/Tub: Tub/shower unit Shower/tub characteristics: Architectural technologist: Standard     Home Equipment: None   Additional Comments: Pt and her sister (in her 53's) live in a split level home. Level entry in the back to the bottom floor, however pt must go up a flight of stairs to the main level and another flight to the second floor where her bedroom/bathroom are. Pt was crawling up the stairs at home due to weakness.       Prior Functioning/Environment Level of Independence: Independent        Comments: Pt was "independent" however was crawling to get up the stairs at home.     OT Diagnosis: Generalized weakness   OT Problem List: Decreased strength;Decreased activity tolerance;Impaired balance (sitting and/or standing);Decreased knowledge of use of DME or AE;Pain   OT Treatment/Interventions: Self-care/ADL training;Therapeutic exercise;Energy conservation;DME and/or AE instruction;Therapeutic activities;Patient/family education;Balance training    OT Goals(Current goals can be found in the care plan section) Acute Rehab OT Goals Patient Stated Goal: to get stronger OT Goal Formulation: With patient Time For Goal Achievement: 09/24/14 Potential to Achieve Goals: Good ADL Goals Pt Will Perform Lower Body Bathing: with supervision;sit to/from stand;with set-up Pt Will Perform Lower Body Dressing: with supervision;sit to/from stand;with set-up Pt Will Transfer to Toilet: with supervision;ambulating Pt Will Perform Toileting - Clothing Manipulation and hygiene: with supervision;sit to/from stand  OT Frequency: Min 2X/week   Barriers to D/C: Decreased caregiver support;Inaccessible home environment              End of Session Equipment Utilized During Treatment: Gait belt;Rolling walker  Activity Tolerance: Patient tolerated treatment well Patient left: in chair;with call bell/phone within reach;with chair alarm set;with family/visitor present   Time: 2440-1027 OT Time Calculation (min): 37 min Charges:  OT General Charges $OT Visit: 1 Procedure OT Evaluation $Initial OT Evaluation Tier I: 1 Procedure OT Treatments $Self Care/Home Management : 23-37 mins  Juluis Rainier 09/10/2014, 9:47 AM   Cyndie Chime, OTR/L Occupational Therapist [phone] (pager)

## 2014-09-10 NOTE — Clinical Social Work Placement (Addendum)
Clinical Social Work Department CLINICAL SOCIAL WORK PLACEMENT NOTE 09/10/2014  Patient:  Carol Rubio, Carol Rubio  Account Number:  [id number] Admit date:  09/08/2014  Clinical Social Worker:  Glendon Axe, CLINICAL SOCIAL WORKER  Date/time:  09/10/2014 02:14 PM  Clinical Social Work is seeking post-discharge placement for this patient at the following level of care:   SKILLED NURSING   (*CSW will update this form in Epic as items are completed)   09/10/2014  Patient/family provided with [facility] Department of Clinical Social Work's list of facilities offering this level of care within the geographic area requested by the patient (or if unable, by the patient's family).  09/10/2014  Patient/family informed of their freedom to choose among providers that offer the needed level of care, that participate in Medicare, Medicaid or managed care program needed by the patient, have an available bed and are willing to accept the patient.  09/10/2014  Patient/family informed of [facility]' ownership interest in [facility], as well as of the fact that they are under no obligation to receive care at this facility.  PASARR submitted to EDS on 09/10/2014 PASARR number received on 09/10/2014  FL2 transmitted to all facilities in geographic area requested by pt/family on  09/10/2014 FL2 transmitted to all facilities within larger geographic area on   Patient informed that his/her managed care company has contracts with or will negotiate with  certain facilities, including the following:   YES     Patient/family informed of bed offers received:  09/11/2014 Patient chooses bed at [facility] and [facility] recommends and patient chooses bed at    Patient to be [facility] to [facility] and [place]   on 09/11/2014 Patient to be transferred to facility by [facility]   Patient and family notified of transfer on 09/11/2014 Name of family member notified: Pt's brother, [name] and  pt's sister, Orbie Hurst.  The following physician request were entered in Epic:   Additional Comments:  Glendon Axe, MSW, LCSWA [phone] 09/10/2014 2:15 PM

## 2014-09-10 NOTE — Progress Notes (Signed)
  Date: 09/10/2014  Patient name: Carol Rubio  Medical record number: [id number]  Date of birth: 08-01-1946   This patient has been seen and the plan of care was discussed with the house staff. Please see their note for complete details. I concur with their findings with the following additions/corrections:  Without complaints, appears to be oriented x3.   Blood pressure 118/58, pulse 56, temperature 98.9 F (37.2 C), temperature source Oral, resp. rate 18, height 5\' 2"  (1.575 m), weight 77.202 kg (170 lb 3.2 oz), SpO2 100 %.  cv- rrr Chest- cta abd- bs+, soft, non-tender.   Labs-  Pancytopenia on CBC, repeated with only HgB 11.8 and PLT 72 Glc 168  A/P Hepatic Encephalopathy Early Dementia?  Discussed with family, pt. She is willing to go to SNF/Rehab.  Appreciate PT/OT evaluation.    Campbell Riches, MD 09/10/2014, 11:48 AM

## 2014-09-10 NOTE — Clinical Social Work Psychosocial (Signed)
Clinical Social Work Department BRIEF PSYCHOSOCIAL ASSESSMENT 09/10/2014  Patient:  Carol Rubio, Carol Rubio     Account Number:  [id number]     Admit date:  09/04/2007  Clinical Social Worker:  Glendon Axe, CLINICAL SOCIAL WORKER  Date/Time:  09/10/2014 02:04 PM  Referred by:  Physician  Date Referred:  09/10/2014 Referred for  SNF Placement   Other Referral:   Interview type:  Other - See comment Other interview type:   CSW met with pt at bedside and spoke with pt's sister, Orbie Hurst via telephone.    PSYCHOSOCIAL DATA Living Status:  ALONE Admitted from facility:   Level of care:   Primary support name:  Maxine Means and Jodie Echevaria Primary support relationship to patient:  SIBLING Degree of support available:   Strong    CURRENT CONCERNS Current Concerns  Post-Acute Placement   Other Concerns:    SOCIAL WORK ASSESSMENT / PLAN Clinical Social Worker spoke with pt at bedside in reference to post-acute placement for SNF. CSW explained SNF process and provided SNF list. Pt reported she was aware of SNF recommendation and agreeable to placement. Pt also advised CSW to contact her sister, Orbie Hurst. CSW spoke with Orbie Hurst who reported she has experienced SNF placement in the past. Pt's sister requested for pt to be placed at [facility] or [facility]. CSW to follow pt for continued support and to facilitate pt's discharge needs once medically stable.   Assessment/plan status:  Psychosocial Support/Ongoing Assessment of Needs Other assessment/ plan:   Information/referral to community resources:   SNF information/list provided.    PATIENT'S/FAMILY'S RESPONSE TO PLAN OF CARE: Pt lying in bed, alert and oriented X4. Pt smiling and agreeable to SNF placement. Pt advised CSW to contact her sister, Orbie Hurst. Pt's sister also agreeable to SNF placement.    Glendon Axe, MSW, [place] [phone] 09/10/2014 2:13 PM

## 2014-09-10 NOTE — Progress Notes (Signed)
PT Cancellation Note  Patient Details Name: Carol Rubio MRN: [id number] DOB: October 21, 1946   Cancelled Treatment:    Reason Eval/Treat Not Completed: Patient not medically ready Pt on strict bedrest. Will await for increase in activity orders prior to PT evaluation.    Candy Sledge A 09/10/2014, 8:55 AM  Candy Sledge, PT, DPT [phone]

## 2014-09-10 NOTE — Evaluation (Signed)
Physical Therapy Evaluation Patient Details Name: Carol Rubio Rubio MRN: [id number] DOB: 02-Nov-1945 Today's Date: 09/10/2014   History of Present Illness  Carol Rubio Rubio is a 68 y.o. female admitted 09/08/14 for lethargy. PMH of DM, dementia, HTN, Hep C, cirrhosis, LE edema. Pt was seen at Hep C clinic and sent to ED for lethargy.     Clinical Impression  Patient presents with functional limitations due to deficits listed in PT problem list (see below). Pt lives in split level home and there are concerns about ability to safely negotiate steps (per brother pt was crawling up steps PTA). Pt reports she is looking for a new home that does not have steps. Pt reports memory concerns, "they are taking away my license cause I can't remember where I am going." Will assess ability to negotiate steps next session. Pt would benefit from acute PT and ST SNF to improve balance, gait, strength and overall mobility so pt can maximize independence and safely return to PLOF (hopefully after moving to safer environment).     Follow Up Recommendations SNF;Supervision/Assistance - 24 hour    Equipment Recommendations  None recommended by PT    Recommendations for Other Services       Precautions / Restrictions Precautions Precautions: Fall Restrictions Weight Bearing Restrictions: No      Mobility  Bed Mobility Overal bed mobility: Needs Assistance Bed Mobility: Supine to Sit     Supine to sit: Min assist;HOB elevated     General bed mobility comments: Received sitting in chair upon PT arrival.   Transfers Overall transfer level: Needs assistance Equipment used: Rolling walker (2 wheeled) Transfers: Sit to/from Stand Sit to Stand: Min guard         General transfer comment: Min guard for safety. No dizziness reported upon standing.  Ambulation/Gait Ambulation/Gait assistance: Min guard Ambulation Distance (Feet): 150 Feet Assistive device: Rolling walker (2 wheeled) Gait  Pattern/deviations: Step-through pattern;Decreased stride length;Trunk flexed Gait velocity: slow   General Gait Details: Pt with slow and steady gait pattern. Vitals stable. VC's for RW proximity.   Stairs            Wheelchair Mobility    Modified Rankin (Stroke Patients Only)       Balance Overall balance assessment: Needs assistance Sitting-balance support: Feet supported;No upper extremity supported Sitting balance-Leahy Scale: Good     Standing balance support: During functional activity Standing balance-Leahy Scale: Poor Standing balance comment: Requires BUE support on RW for balance/safety. Pt given choice to use RW vs not and pt requested to use RW demonstrating some safety awareness.                             Pertinent Vitals/Pain Pain Assessment: No/denies pain Pain Score: 3  Pain Location: lower back Pain Descriptors / Indicators: Sore Pain Intervention(s): Monitored during session    Home Living Family/patient expects to be discharged to:: Private residence Living Arrangements: Other (Comment) Available Help at Discharge: Family;Available PRN/intermittently Type of Home: House Home Access: Stairs to enter   [employer] of Steps: 4 in the front; none in the back (ground level) Home Layout: Multi-level Home Equipment: Walker - 2 wheels;Cane - single point;Bedside commode;Shower seat;Wheelchair - manual Additional Comments: Pt and her sister (in her 3's) live in a split level home. Level entry in the back to the bottom floor, however pt must go up a flight of stairs to the main level and another flight to the  second floor where her bedroom/bathroom are. Pt was crawling up the stairs at home due to weakness.     Prior Function Level of Independence: Independent with assistive device(s)         Comments: Pt reports using RW at times as needed. Did not mention crawling up steps to enter home to PT, only OT. Some inconsistencies  reported during PT/OT evaluation. Pt reports driving, grocery shopping. No falls reported.     Hand Dominance   Dominant Hand: Right    Extremity/Trunk Assessment   Upper Extremity Assessment: Defer to OT evaluation           Lower Extremity Assessment: Generalized weakness      Cervical / Trunk Assessment: Normal  Communication   Communication: No difficulties  Cognition Arousal/Alertness: Awake/alert Behavior During Therapy: WFL for tasks assessed/performed Overall Cognitive Status: Within Functional Limits for tasks assessed (A&O x3 - able to state "November" "15")       Memory: Decreased short-term memory              General Comments      Exercises        Assessment/Plan    PT Assessment Patient needs continued PT services  PT Diagnosis Generalized weakness   PT Problem List Decreased strength;Decreased cognition;Decreased knowledge of use of DME;Decreased balance  PT Treatment Interventions Balance training;Gait training;DME instruction;Neuromuscular re-education;Stair training;Patient/family education;Functional mobility training;Therapeutic activities;Therapeutic exercise   PT Goals (Current goals can be found in the Care Plan section) Acute Rehab PT Goals Patient Stated Goal: to get home to her poodle PT Goal Formulation: With patient Time For Goal Achievement: 09/24/14 Potential to Achieve Goals: Fair    Frequency Min 3X/week   Barriers to discharge Inaccessible home environment Pt lives in split level home with multiple steps to get to bedroom/kitchen etc.    Co-evaluation               End of Session Equipment Utilized During Treatment: Gait belt Activity Tolerance: Patient tolerated treatment well Patient left: in chair;with call bell/phone within reach;with chair alarm set Nurse Communication: Mobility status;Precautions         Time: 0944-1000 PT Time Calculation (min) (ACUTE ONLY): 16 min   Charges:   PT  Evaluation $Initial PT Evaluation Tier I: 1 Procedure PT Treatments $Gait Training: 8-22 mins   PT G CodesCandy Sledge A 09/10/2014, 10:07 AM Candy Sledge, [place], DPT [phone]

## 2014-09-10 NOTE — Progress Notes (Signed)
Subjective:  Patient states that she did well last night. She reported some difficulty falling asleep, so her home trazodone was restarted as it was being withheld given delirium. She slept fine after trazodone was given.She says that her appetite is back to her baseline as well. She has not had a BM since her first night in the hospital when she received a lactulose enema. She denies any abdominal pain, cp, or shob. She feels that her b/l leg edema has gone down significantly. She states that she feels ready to go home.   Objective: Vital signs in last 24 hours: Filed Vitals:   09/09/14 2128 09/10/14 0128 09/10/14 0513 09/10/14 1049  BP: 124/61 121/53 117/56 118/58  Pulse: 64 78 61 56  Temp: 98.7 F (37.1 C) 99 F (37.2 C) 99.1 F (37.3 C) 98.9 F (37.2 C)  TempSrc: Oral Oral Oral Oral  Resp: 16 16 18 18   Height:      Weight:   77.202 kg (170 lb 3.2 oz)   SpO2: 100% 98% 100% 100%   Weight change: -1.27 kg (-2 lb 12.8 oz)  Intake/Output Summary (Last 24 hours) at 09/10/14 1302 Last data filed at 09/10/14 1238  Gross per 24 hour  Intake    200 ml  Output      0 ml  Net    200 ml   BP 118/58 mmHg  Pulse 56  Temp(Src) 98.9 F (37.2 C) (Oral)  Resp 18  Ht 5\' 2"  (1.575 m)  Wt 77.202 kg (170 lb 3.2 oz)  BMI 31.12 kg/m2  SpO2 100%   Vitals signs were reviewed and are stable. General appearance: alert, cooperative, appears stated age and no distress Head: Normocephalic, without obvious abnormality, atraumatic Eyes: conjunctivae/corneas clear. PERRL, EOM's intact. Fundi benign. Throat: lips, mucosa, and tongue normal; teeth and gums normal Neck: no adenopathy, no carotid bruit, no JVD, supple, symmetrical, trachea midline and thyroid not enlarged, symmetric, no tenderness/mass/nodules Lungs: clear to auscultation bilaterally Heart: regular rate and rhythm, S1, S2 normal, no murmur, click, rub or gallop Abdomen: soft, non-tender; bowel sounds normal; no masses,  no  organomegaly Extremities: extremities normal, atraumatic, no cyanosis or edema. Mild TTP of LLE along calf. No signs of erythema or unilateral swelling. Pulses: 2+ and symmetric Neurologic: Mental status: Alert, oriented, thought content appropriate   Lab Results:  Basic Metabolic Panel:  Recent Labs Lab 09/09/14 0703 09/10/14 0421  NA 143 139  K 3.4* 4.5  CL 107 104  CO2 26 22  GLUCOSE 132* 168*  BUN 11 12  CREATININE 0.59 0.57  CALCIUM 8.8 8.5   Liver Function Tests:  Recent Labs Lab 09/08/14 1310  AST 43*  ALT 30  ALKPHOS 257*  BILITOT 0.7  PROT 8.3  ALBUMIN 2.6*   No results for input(s): LIPASE, AMYLASE in the last 168 hours.  Recent Labs Lab 09/08/14 1347  AMMONIA 103*   CBC:  Recent Labs Lab 09/08/14 1310  09/10/14 0421 09/10/14 1100  WBC 3.8*  < > 1.9* 4.2  NEUTROABS 2.1  --   --   --   HGB 11.2*  < > 17.2* 11.8*  HCT 34.2*  < > 52.5* 36.1  MCV 87.2  < > 86.6 87.6  PLT 87*  < > 32* 72*  < > = values in this interval not displayed.   CBG:  Recent Labs Lab 09/09/14 1630 09/09/14 1944 09/09/14 2352 09/10/14 0342 09/10/14 0654 09/10/14 1127  GLUCAP 160* 175* 190* 164* 140*  142*   Hemoglobin A1C:  Recent Labs Lab 09/09/14 0703  HGBA1C 6.6*   Thyroid Function Tests:  Recent Labs Lab 09/09/14 0703  TSH 0.337*   Coagulation:  Recent Labs Lab 09/08/14 1310  LABPROT 16.5*  INR 1.32   Anemia Panel:  Recent Labs Lab 09/09/14 0703  VITAMINB12 838  FOLATE 14.9   Urinalysis:  Recent Labs Lab 09/08/14 1601  COLORURINE YELLOW  LABSPEC 1.020  PHURINE 6.5  GLUCOSEU >1000*  HGBUR SMALL*  BILIRUBINUR NEGATIVE  KETONESUR NEGATIVE  PROTEINUR NEGATIVE  UROBILINOGEN 2.0*  NITRITE NEGATIVE  LEUKOCYTESUR SMALL*    Micro Results: No results found for this or any previous visit (from the past 240 hour(s)). Studies/Results: Dg Chest 2 View  09/08/2014   CLINICAL DATA:  Altered mental status.  EXAM: CHEST  2 VIEW   COMPARISON:  06/16/2013  FINDINGS: Borderline cardiomegaly. Negative aortic and hilar contours. Diffuse interstitial coarsening with cephalized blood flow. No Kerley lines or pleural effusion. No focal opacity confirmed on both views.  IMPRESSION: Bilateral interstitial opacity, favored congestive rather than infectious.   Electronically Signed   By: Jorje Guild M.D.   On: 09/08/2014 16:05   Ct Head Wo Contrast  09/08/2014   CLINICAL DATA:  Insomnia.  Weakness for 3-4 weeks.  Memory problems.  EXAM: CT HEAD WITHOUT CONTRAST  TECHNIQUE: Contiguous axial images were obtained from the base of the skull through the vertex without intravenous contrast.  COMPARISON:  Brain MRI, 05/03/2012 and head CT, 10/05/2011.  FINDINGS: Ventricles are normal in configuration. There is ventricular and sulcal enlargement reflecting mild atrophy. No hydrocephalus.  No parenchymal masses or mass effect. Patchy areas of white matter hypoattenuation are noted consistent with mild chronic microvascular ischemic change.  There is no evidence of a cortical infarct.  There are no extra-axial masses or abnormal fluid collections.  No intracranial hemorrhage.  Visualized sinuses and mastoid air cells are clear. No skull lesion.  IMPRESSION: 1. No acute intracranial abnormalities. 2. Mild atrophy and chronic microvascular ischemic change.   Electronically Signed   By: Lajean Manes M.D.   On: 09/08/2014 16:17   US Abdomen Limited  09/08/2014   CLINICAL DATA:  Hepatic encephalopathy.  EXAM: LIMITED ABDOMEN ULTRASOUND FOR ASCITES  TECHNIQUE: Limited ultrasound survey for ascites was performed in all four abdominal quadrants.  COMPARISON:  Abdominal ultrasound 08/04/2014.  FINDINGS: No ascites is identified.  IMPRESSION: Negative for ascites.   Electronically Signed   By: Inge Rise M.D.   On: 09/08/2014 23:29   Echocardiogram (09/09/14)  - Impressions:    Normal LV size with EF 60-65%. Mild LV hypertrophy. Normal RV size and  systolic function. No significant     valvularabnormalities.   Medications: I have reviewed the patient's current medications. Scheduled Meds: . amLODipine  10 mg Oral Daily  . ARIPiprazole  10 mg Oral Daily  . insulin aspart  0-9 Units Subcutaneous TID WC  . lactulose  30 g Oral Daily  . latanoprost  1 drop Both Eyes QHS  . Ledipasvir-Sofosbuvir  1 tablet Oral Daily  . lisinopril  10 mg Oral Daily  . montelukast  10 mg Oral Daily  . oxybutynin  5 mg Oral BID  . PARoxetine  20 mg Oral Daily  . traZODone  25 mg Oral QHS   Assessment/Plan: Active Problems:   Weakness   Acute encephalopathy 68 y.o. 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 Continues to improve.  Patient and family feel she is back to her baseline.  1.) Encephalopathy - Was likely hepatic in origin. Ammonia was 103 on admission. Other causes likely as CT and UA were negative. Vitamin b12, Folate, and TSH were normal. Electrolytes were normal. Mild LFT and AP elevation. - Lactulose enema was successful. Continue PO Lactulose.   2.) Early dementia - Labs ruled out reversible causes. Unclear etiology at this time. Has not affected adls. PT/OT eval suggest she needs assistance.  3.) Volume overload  - Echocardiogram was normal, b/l LE edema resolved, pt still complains of doe  4.) Hep C - Continue home Harvoni  5.) HTN - BP has been in the 130/70 range. Continue home amlodipine and lisinopril   Dispo: Plan is to D/C patient to a SNIF. Patient and her family are both agreeable to this decision. Social work is currently working with pt and her family to find SNIF that works best for them.  The patient does have a current PCP Barton Fanny, MD) and does need an [facility] hospital follow-up appointment after discharge.  The patient does have transportation limitations that hinder transportation to clinic appointments.  .Services Needed at time of  discharge: Y = Yes, Blank = No PT:   OT:   RN:   Equipment:   Other:     LOS: 2 days   Farrell Ours, Med Student 09/10/2014, 1:02 PM

## 2014-09-10 NOTE — Progress Notes (Signed)
Subjective: Got heating pad for back pain and got trazodone for insomnia.got  harvony that patient's family brought it. 3x BM since yesterday. Doing well. Awake. Doesn't have any complaint   Objective: Vital signs in last 24 hours: Filed Vitals:   09/10/14 0128 09/10/14 0513 09/10/14 1049 09/10/14 1356  BP: 121/53 117/56 118/58 118/49  Pulse: 78 61 56 75  Temp: 99 F (37.2 C) 99.1 F (37.3 C) 98.9 F (37.2 C) 97.2 F (36.2 C)  TempSrc: Oral Oral Oral Oral  Resp: _0 Height:      Weight:  77.202 kg (170 lb 3.2 oz)    SpO2: 98% 100% 100% 100%   Weight change: -1.27 kg (-2 lb 12.8 oz)  Intake/Output Summary (Last 24 hours) at 09/10/14 1438 Last data filed at 09/10/14 1238  Gross per 24 hour  Intake    200 ml  Output      0 ml  Net    200 ml   Vitals reviewed. General: resting in bed, NAD. Awake, no somnolence noted today.  HEENT: PERRL, EOMI, no scleral icterus Cardiac: RRR, no rubs, murmurs or gallops Pulm: clear to auscultation bilaterally, no wheezes, rales, or rhonchi Abd: soft, nontender, has distension. BS present Ext: warm and well perfused, no pedal edema Neuro: alert and oriented X4. Able to tell [place] president's name. Has some circumferential thought process when asked about detailed questions. cranial nerves II-XII grossly intact, strength and sensation to light touch equal in bilateral upper and lower extremities. No asterixis observed today.   Lab Results: Basic Metabolic Panel:  Recent Labs Lab 09/09/14 0703 09/10/14 0421  NA 143 139  K 3.4* 4.5  CL 107 104  CO2 26 22  GLUCOSE 132* 168*  BUN 11 12  CREATININE 0.59 0.57  CALCIUM 8.8 8.5   Liver Function Tests:  Recent Labs Lab 09/08/14 1310  AST 43*  ALT 30  ALKPHOS 257*  BILITOT 0.7  PROT 8.3  ALBUMIN 2.6*   No results for input(s): LIPASE, AMYLASE in the last 168 hours.  Recent Labs Lab 09/08/14 1347  AMMONIA 103*   CBC:  Recent Labs Lab 09/08/14 1310  09/10/14 0421  09/10/14 1100  WBC 3.8*  < > 1.9* 4.2  NEUTROABS 2.1  --   --   --   HGB 11.2*  < > 17.2* 11.8*  HCT 34.2*  < > 52.5* 36.1  MCV 87.2  < > 86.6 87.6  PLT 87*  < > 32* 72*  < > = values in this interval not displayed. Cardiac Enzymes: No results for input(s): CKTOTAL, CKMB, CKMBINDEX, TROPONINI in the last 168 hours. BNP: No results for input(s): PROBNP in the last 168 hours. D-Dimer: No results for input(s): DDIMER in the last 168 hours. CBG:  Recent Labs Lab 09/09/14 1630 09/09/14 1944 09/09/14 2352 09/10/14 0342 09/10/14 0654 09/10/14 1127  GLUCAP 160* 175* 190* 164* 140* 142*   Hemoglobin A1C:  Recent Labs Lab 09/09/14 0703  HGBA1C 6.6*   Fasting Lipid Panel: No results for input(s): CHOL, HDL, LDLCALC, TRIG, CHOLHDL, LDLDIRECT in the last 168 hours. Thyroid Function Tests:  Recent Labs Lab 09/09/14 0703  TSH 0.337*   Coagulation:  Recent Labs Lab 09/08/14 1310  LABPROT 16.5*  INR 1.32   Anemia Panel:  Recent Labs Lab 09/09/14 0703  VITAMINB12 838  FOLATE 14.9   Urine Drug Screen: Drugs of Abuse  No results found for: LABOPIA, Weed, LABBENZ, AMPHETMU, THCU, LABBARB  Alcohol  Level: No results for input(s): ETH in the last 168 hours. Urinalysis:  Recent Labs Lab 09/08/14 1601  COLORURINE YELLOW  LABSPEC 1.020  PHURINE 6.5  GLUCOSEU >1000*  HGBUR SMALL*  BILIRUBINUR NEGATIVE  KETONESUR NEGATIVE  PROTEINUR NEGATIVE  UROBILINOGEN 2.0*  NITRITE NEGATIVE  LEUKOCYTESUR SMALL*   Misc. Labs:  Micro Results: No results found for this or any previous visit (from the past 240 hour(s)). Studies/Results: Dg Chest 2 View  09/08/2014   CLINICAL DATA:  Altered mental status.  EXAM: CHEST  2 VIEW  COMPARISON:  06/16/2013  FINDINGS: Borderline cardiomegaly. Negative aortic and hilar contours. Diffuse interstitial coarsening with cephalized blood flow. No Kerley lines or pleural effusion. No focal opacity confirmed on both views.   IMPRESSION: Bilateral interstitial opacity, favored congestive rather than infectious.   Electronically Signed   By: Jorje Guild M.D.   On: 09/08/2014 16:05   Ct Head Wo Contrast  09/08/2014   CLINICAL DATA:  Insomnia.  Weakness for 3-4 weeks.  Memory problems.  EXAM: CT HEAD WITHOUT CONTRAST  TECHNIQUE: Contiguous axial images were obtained from the base of the skull through the vertex without intravenous contrast.  COMPARISON:  Brain MRI, 05/03/2012 and head CT, 10/05/2011.  FINDINGS: Ventricles are normal in configuration. There is ventricular and sulcal enlargement reflecting mild atrophy. No hydrocephalus.  No parenchymal masses or mass effect. Patchy areas of white matter hypoattenuation are noted consistent with mild chronic microvascular ischemic change.  There is no evidence of a cortical infarct.  There are no extra-axial masses or abnormal fluid collections.  No intracranial hemorrhage.  Visualized sinuses and mastoid air cells are clear. No skull lesion.  IMPRESSION: 1. No acute intracranial abnormalities. 2. Mild atrophy and chronic microvascular ischemic change.   Electronically Signed   By: Lajean Manes M.D.   On: 09/08/2014 16:17   US Abdomen Limited  09/08/2014   CLINICAL DATA:  Hepatic encephalopathy.  EXAM: LIMITED ABDOMEN ULTRASOUND FOR ASCITES  TECHNIQUE: Limited ultrasound survey for ascites was performed in all four abdominal quadrants.  COMPARISON:  Abdominal ultrasound 08/04/2014.  FINDINGS: No ascites is identified.  IMPRESSION: Negative for ascites.   Electronically Signed   By: Inge Rise M.D.   On: 09/08/2014 23:29   Medications: I have reviewed the patient's current medications. Scheduled Meds: . amLODipine  10 mg Oral Daily  . ARIPiprazole  10 mg Oral Daily  . insulin aspart  0-9 Units Subcutaneous TID WC  . lactulose  30 g Oral Daily  . latanoprost  1 drop Both Eyes QHS  . Ledipasvir-Sofosbuvir  1 tablet Oral Daily  . lisinopril  10 mg Oral Daily  .  montelukast  10 mg Oral Daily  . oxybutynin  5 mg Oral BID  . PARoxetine  20 mg Oral Daily  . traZODone  25 mg Oral QHS   Continuous Infusions:  PRN Meds:. Assessment/Plan: Active Problems:   Weakness   Acute encephalopathy  68 yo female with Hep C, cirrhosis, dementia here with acute encephalopathy most likely 2/2 to hepatic cause.   Hepatic encephalopathy - improving- ammonia 103. Other causes unlikely as CT negative. UA negative. Electrolytes are normal. Has mild ast/alt elev. Has high alk phos. - received lactulose enema. Cont PO. - limited ab u/s showed no ascites.  - neuro check -  b12 and folate normal., TSH low. Needs outpatient follow up for low TSH.   Diastolic CHF - echo 57/26/20 EF 60-65%, mild LVH, grade 2 diastolic. - appears to be  volume overloaded on lower extremities. This could also be cause of hepatic encephalopathy with hepatic vascular congestion.  - on norvasc, lisinopril. Add asa on discharge. Will also add co-reg 3.136m bid..   Leukopenia and thrombocytopenia 2/2 to lab abnormality. Repeat lab shows stable wbc and hgb. plt low 72 from 90..  - concerned for HIIT. Stopped lovenox.repeated CBC. May order HIIT panel tomorrow if plt dropping further.  Early Dementia - unclear etiology. Will get labs to rule out reversible causes as above. Increasing forgetfulness. Hasn't affected adl's yet. PT eval done, needs snf level care.   Hep C - on treatment with harvoni.brother brought her home med here. Will continue giving this.  Diabetes II - hgba1c 6.6. Was diet controlled.  - Will add metformin on discharge.  Depression - cont abilify, paxil.   HTN - on amlodopine, lisinopril at home - continue these   Dispo: Disposition is deferred at this time, awaiting improvement of current medical problems.  Anticipated discharge in approximately 1-2 day(s).   The patient does have a current PCP (Barton Fanny MD) and [facility].  The patient does have transportation limitations that hinder transportation to clinic appointments.  .Services Needed at time of discharge: Y = Yes, Blank = No PT:   OT:   RN:   Equipment:   Other:     LOS: 2 days   TDellia Nims MD 09/10/2014, 2:38 PM

## 2014-09-11 DIAGNOSIS — K7682 Hepatic encephalopathy: Secondary | ICD-10-CM | POA: Insufficient documentation

## 2014-09-11 DIAGNOSIS — I509 Heart failure, unspecified: Secondary | ICD-10-CM | POA: Diagnosis not present

## 2014-09-11 DIAGNOSIS — G9340 Encephalopathy, unspecified: Secondary | ICD-10-CM | POA: Diagnosis not present

## 2014-09-11 DIAGNOSIS — K7290 Hepatic failure, unspecified without coma: Secondary | ICD-10-CM | POA: Diagnosis not present

## 2014-09-11 DIAGNOSIS — I5030 Unspecified diastolic (congestive) heart failure: Secondary | ICD-10-CM | POA: Diagnosis not present

## 2014-09-11 DIAGNOSIS — R5381 Other malaise: Secondary | ICD-10-CM | POA: Diagnosis not present

## 2014-09-11 DIAGNOSIS — H4011X1 Primary open-angle glaucoma, mild stage: Secondary | ICD-10-CM | POA: Diagnosis not present

## 2014-09-11 DIAGNOSIS — R600 Localized edema: Secondary | ICD-10-CM | POA: Diagnosis not present

## 2014-09-11 DIAGNOSIS — F0390 Unspecified dementia without behavioral disturbance: Secondary | ICD-10-CM | POA: Diagnosis not present

## 2014-09-11 DIAGNOSIS — R260 Ataxic gait: Secondary | ICD-10-CM | POA: Diagnosis not present

## 2014-09-11 DIAGNOSIS — K7460 Unspecified cirrhosis of liver: Secondary | ICD-10-CM | POA: Diagnosis not present

## 2014-09-11 DIAGNOSIS — I10 Essential (primary) hypertension: Secondary | ICD-10-CM | POA: Diagnosis not present

## 2014-09-11 DIAGNOSIS — Z961 Presence of intraocular lens: Secondary | ICD-10-CM | POA: Diagnosis not present

## 2014-09-11 DIAGNOSIS — B182 Chronic viral hepatitis C: Secondary | ICD-10-CM | POA: Diagnosis not present

## 2014-09-11 DIAGNOSIS — F329 Major depressive disorder, single episode, unspecified: Secondary | ICD-10-CM | POA: Diagnosis not present

## 2014-09-11 DIAGNOSIS — I5032 Chronic diastolic (congestive) heart failure: Secondary | ICD-10-CM | POA: Diagnosis not present

## 2014-09-11 DIAGNOSIS — M6281 Muscle weakness (generalized): Secondary | ICD-10-CM | POA: Diagnosis not present

## 2014-09-11 DIAGNOSIS — R531 Weakness: Secondary | ICD-10-CM | POA: Diagnosis not present

## 2014-09-11 DIAGNOSIS — B0239 Other herpes zoster eye disease: Secondary | ICD-10-CM | POA: Diagnosis not present

## 2014-09-11 DIAGNOSIS — D72819 Decreased white blood cell count, unspecified: Secondary | ICD-10-CM | POA: Diagnosis not present

## 2014-09-11 DIAGNOSIS — Z794 Long term (current) use of insulin: Secondary | ICD-10-CM | POA: Diagnosis not present

## 2014-09-11 DIAGNOSIS — F419 Anxiety disorder, unspecified: Secondary | ICD-10-CM | POA: Diagnosis not present

## 2014-09-11 DIAGNOSIS — B1920 Unspecified viral hepatitis C without hepatic coma: Secondary | ICD-10-CM | POA: Diagnosis not present

## 2014-09-11 DIAGNOSIS — R41841 Cognitive communication deficit: Secondary | ICD-10-CM | POA: Diagnosis not present

## 2014-09-11 DIAGNOSIS — E119 Type 2 diabetes mellitus without complications: Secondary | ICD-10-CM | POA: Diagnosis not present

## 2014-09-11 LAB — CBC
HCT: 31.8 % — ABNORMAL LOW (ref 36.0–46.0)
Hemoglobin: 10.5 g/dL — ABNORMAL LOW (ref 12.0–15.0)
MCH: 27.9 pg (ref 26.0–34.0)
MCHC: 33 g/dL (ref 30.0–36.0)
MCV: 84.4 fL (ref 78.0–100.0)
Platelets: 85 10*3/uL — ABNORMAL LOW (ref 150–400)
RBC: 3.77 MIL/uL — ABNORMAL LOW (ref 3.87–5.11)
RDW: 14.6 % (ref 11.5–15.5)
WBC: 8.1 10*3/uL (ref 4.0–10.5)

## 2014-09-11 LAB — GLUCOSE, CAPILLARY
Glucose-Capillary: 116 mg/dL — ABNORMAL HIGH (ref 70–99)
Glucose-Capillary: 141 mg/dL — ABNORMAL HIGH (ref 70–99)
Glucose-Capillary: 168 mg/dL — ABNORMAL HIGH (ref 70–99)

## 2014-09-11 LAB — BASIC METABOLIC PANEL
Anion gap: 11 (ref 5–15)
BUN: 10 mg/dL (ref 6–23)
CO2: 23 mEq/L (ref 19–32)
Calcium: 8.6 mg/dL (ref 8.4–10.5)
Chloride: 103 mEq/L (ref 96–112)
Creatinine, Ser: 0.58 mg/dL (ref 0.50–1.10)
GFR calc Af Amer: >90 mL/min (ref >90)
GFR calc non Af Amer: >90 mL/min (ref >90)
Glucose, Bld: 133 mg/dL — ABNORMAL HIGH (ref 70–99)
Potassium: 4.1 mEq/L (ref 3.7–5.3)
Sodium: 137 mEq/L (ref 137–147)

## 2014-09-11 MED ORDER — METFORMIN HCL 500 MG PO TABS: 500.0000 mg | ORAL_TABLET | Freq: Two times a day (BID) | ORAL | Status: DC

## 2014-09-11 MED ORDER — LACTULOSE 10 GM/15ML PO SOLN: 30.0000 g | Freq: Every day | ORAL | Status: DC | PRN

## 2014-09-11 MED ORDER — ASPIRIN EC 81 MG PO TBEC: 81.0000 mg | DELAYED_RELEASE_TABLET | Freq: Every day | ORAL | Status: DC

## 2014-09-11 MED ORDER — LACTULOSE 10 GM/15ML PO SOLN
30.0000 g | Freq: Once | ORAL | Status: AC
  Administered 2014-09-11: 30 g via ORAL
  Filled 2014-09-11: qty 45

## 2014-09-11 MED ORDER — CARVEDILOL 3.125 MG PO TABS: 3.1250 mg | ORAL_TABLET | Freq: Two times a day (BID) | ORAL | Status: DC

## 2014-09-11 NOTE — Discharge Instructions (Signed)
You were admitted with altered mental status. It was likely from your liver. You should continue taking lactulose to help clear out the toxins from your body which are not cleared because of your liver dysfunction. Follow up with your primary doctor in 1 week or less.

## 2014-09-11 NOTE — Plan of Care (Signed)
Problem: Phase III Progression Outcomes Goal: Other Phase III Outcomes/Goals Outcome: Completed/Met Date Met:  09/11/14

## 2014-09-11 NOTE — Progress Notes (Signed)
Patient leaving for the skilled nursing facilities.

## 2014-09-11 NOTE — Clinical Social Work Note (Signed)
Clinical Social Worker facilitated patient discharge including contacting patient family and facility to confirm patient discharge plans.  Clinical information faxed to facility and family agreeable with plan.  CSW arranged ambulance transport via [facility] to [facility].  RN to call report prior to discharge.  Clinical Social Worker will sign off for now as social work intervention is no longer needed. Please consult [place] again if new need arises.  Glendon Axe, MSW, LCSWA [phone] 09/11/2014 3:04 PM

## 2014-09-11 NOTE — Discharge Summary (Signed)
Name: Carol Rubio MRN: [id number] DOB: January 16, 1946 68 y.o. PCP: Barton Fanny, MD  Date of Admission: 09/08/2014 12:48 PM Date of Discharge: 09/11/2014 Attending Physician: Campbell Riches, MD  Discharge Diagnosis: Active Problems:   Weakness   Acute encephalopathy   Hepatic encephalopathy   Chronic hepatitis C without hepatic coma   Essential hypertension  Discharge Medications:   Medication List    STOP taking these medications        mirtazapine 15 MG tablet  Commonly known as:  REMERON      TAKE these medications        ABILIFY 10 MG tablet  Generic drug:  ARIPiprazole  Take 10 mg by mouth daily.     amLODipine 10 MG tablet  Commonly known as:  NORVASC  Take 10 mg by mouth daily.     aspirin EC 81 MG tablet  Take 1 tablet (81 mg total) by mouth daily.     carvedilol 3.125 MG tablet  Commonly known as:  COREG  Take 1 tablet (3.125 mg total) by mouth 2 (two) times daily with a meal.     furosemide 40 MG tablet  Commonly known as:  LASIX  Take 40 mg by mouth daily.     HARVONI 90-400 MG Tabs  Generic drug:  Ledipasvir-Sofosbuvir  Take 1 tablet by mouth daily.     lactulose 10 GM/15ML solution  Commonly known as:  CHRONULAC  Take 45-90 mLs (30-60 g total) by mouth daily as needed for mild constipation (titrate to have 2-3 Bowel movements daily.).     latanoprost 0.005 % ophthalmic solution  Commonly known as:  XALATAN  Place 1 drop into both eyes at bedtime.     lisinopril 10 MG tablet  Commonly known as:  PRINIVIL,ZESTRIL  Take 10 mg by mouth daily.     metFORMIN 500 MG tablet  Commonly known as:  GLUCOPHAGE  Take 1 tablet (500 mg total) by mouth 2 (two) times daily with a meal.     montelukast 10 MG tablet  Commonly known as:  SINGULAIR  Take 1 tablet by mouth daily.     oxybutynin 5 MG tablet  Commonly known as:  DITROPAN  Take 5 mg by mouth 2 (two) times daily.     PARoxetine 20 MG tablet  Commonly known as:  PAXIL  Take 20  mg by mouth daily.     traZODone 50 MG tablet  Commonly known as:  DESYREL  Take 25 mg by mouth at bedtime.        Disposition and follow-up:   Ms.Carol Rubio was discharged from [facility] in Stable condition.  At the hospital follow up visit please address:  1.  Please make sure her BP doesn't drop too low. We have added coreg 3.$RemoveBefor'125mg'rYAgHpuzvBpE$  bid to her regimen for CHF. Also continued her $RemoveBefor'40mg'uEnnFpqwJZlz$  lasix on discharge, along with continuing amlodipine.   Please see how she is doing with her sugar. We have started metformin $RemoveBeforeDEI'500mg'DMjDqsqkkaZwBQYI$  BID since her hgba1c was 6.6  Please make sure she is having BM's with lactulose 30-$RemoveBeforeDEI'60mg'SQmVRDmrHIopWJtW$  daily. Please assess her mental status.  2.  Labs / imaging needed at time of follow-up:   3.  Pending labs/ test needing follow-up:   Follow-up Appointments:     Follow-up Information    Follow up with [facility], [place].   Specialty:  Family Medicine   Contact information:   [address] [place] [address] [phone]  Discharge Instructions:   Consultations:    Procedures Performed:  Dg Chest 2 View  09/08/2014   CLINICAL DATA:  Altered mental status.  EXAM: CHEST  2 VIEW  COMPARISON:  06/16/2013  FINDINGS: Borderline cardiomegaly. Negative aortic and hilar contours. Diffuse interstitial coarsening with cephalized blood flow. No Kerley lines or pleural effusion. No focal opacity confirmed on both views.  IMPRESSION: Bilateral interstitial opacity, favored congestive rather than infectious.   Electronically Signed   By: Jorje Guild M.D.   On: 09/08/2014 16:05   Ct Head Wo Contrast  09/08/2014   CLINICAL DATA:  Insomnia.  Weakness for 3-4 weeks.  Memory problems.  EXAM: CT HEAD WITHOUT CONTRAST  TECHNIQUE: Contiguous axial images were obtained from the base of the skull through the vertex without intravenous contrast.  COMPARISON:  Brain MRI, 05/03/2012 and head CT, 10/05/2011.  FINDINGS: Ventricles are normal in configuration.  There is ventricular and sulcal enlargement reflecting mild atrophy. No hydrocephalus.  No parenchymal masses or mass effect. Patchy areas of white matter hypoattenuation are noted consistent with mild chronic microvascular ischemic change.  There is no evidence of a cortical infarct.  There are no extra-axial masses or abnormal fluid collections.  No intracranial hemorrhage.  Visualized sinuses and mastoid air cells are clear. No skull lesion.  IMPRESSION: 1. No acute intracranial abnormalities. 2. Mild atrophy and chronic microvascular ischemic change.   Electronically Signed   By: Lajean Manes M.D.   On: 09/08/2014 16:17   US Abdomen Limited  09/08/2014   CLINICAL DATA:  Hepatic encephalopathy.  EXAM: LIMITED ABDOMEN ULTRASOUND FOR ASCITES  TECHNIQUE: Limited ultrasound survey for ascites was performed in all four abdominal quadrants.  COMPARISON:  Abdominal ultrasound 08/04/2014.  FINDINGS: No ascites is identified.  IMPRESSION: Negative for ascites.   Electronically Signed   By: Inge Rise M.D.   On: 09/08/2014 23:29    2D Echo: Normal LV size with EF 60-65%. Mild LV hypertrophy. Normal RV size and systolic function. No significant valvular abnormalities.grade 2 diastolic dysfunction  Cardiac Cath:   Admission HPI:   68 yo female with hx of DM (diet controlled), dementia, HTN,Hep C (on harvoni for last 6 weeks), cirrhosis, lower ext edema, here with increased lethargy. Patient has been drowsy for last few days but has increased progressively. She was seen at the Hep C clinic today. They sent her here for her lethargy. Patient has lower ext swelling for several months. Her average weight is 140-155 but now she is up in 173. This increase happened over last 3-4 months. No recent ECHO was done. Denies any sob/chest pain.   Hx obtained mainly from patient's brother. Patient lives with her sister (who is in her 60's). She has been having increased forgetfulness for last 3 months, (forgets  car keys, forgets that she ran stop signs). Follows [facility] for primary care. Follows with mental health also for anxiety/depression.  Ammonia level was 103. cxr showed Bilateral interstitial opacity, favored congestive rather than infectious. UA was negative for UTI. Ct head shows chronic microvascular ischemic change.  Hospital Course by problem list:  67 yo female with Hep C, cirrhosis, dementia here with acute encephalopathy most likely 2/2 to hepatic cause.   Hepatic encephalopathy - improving- ammonia 103. Other causes unlikely as CT negative. UA negative. Electrolytes are normal. Has mild ast/alt elev. Has high alk phos. - received lactulose enema and had BM with it. Was on $Remo'30mg'QuAuy$  daily PO lactulose without any BM last 3 days. Will  give extra dose today before discharge to SNF. At SNF she can get lactulose $RemoveBefor'30mg'FqdCaFbYQLTG$  daily with titration PRN to have 2-3 BM daily.  - limited ab u/s showed no ascites.  - b12 and folate normal., TSH low. Needs outpatient follow up for low TSH.   Diastolic CHF - echo 16/01/09 EF 60-65%, mild LVH, grade 2 diastolic. - appears to be volume overloaded on lower extremities. This could also be cause of hepatic encephalopathy with hepatic vascular congestion.  - on norvasc, lisinopril. Add asa on discharge. Will also add co-reg 3.$RemoveBefo'125mg'woFTHLNkCmD$  bid..   Leukopenia and thrombocytopenia 2/2 to lab abnormality. Repeat lab shows stable wbc and hgb. plt low 72 from 90.. But today back to 85.  - concerned for HIIT. Stopped lovenox.repeated CBC shows plt improved to baseline ~85.  Early Dementia - unclear etiology. Will get labs to rule out reversible causes as above. Increasing forgetfulness. Hasn't affected adl's yet. PT eval done, needs snf level care.  Hep C - on treatment with harvoni.brother brought her home med here. Will continue giving this.  Diabetes II - hgba1c 6.6. Was diet controlled.  - Will add metformin on discharge $RemoveBefo'500mg'MJfbWaddVmG$  BID.  Depression - cont abilify,  paxil.   HTN - on amlodopine, lisinopril at home - continue these. Added coreg 3.$RemoveBefor'125mg'eSROuuWbhGiS$  bid for cardiac protection for CHF. Also cont lasix $RemoveBe'40mg'ONzdDwxBa$  home dose daily.  Discharge Vitals:   BP 123/55 mmHg  Pulse 60  Temp(Src) 98.8 F (37.1 C) (Oral)  Resp 20  Ht $R'5\' 2"'Ou$  (1.575 m)  Wt 79.516 kg (175 lb 4.8 oz)  BMI 32.05 kg/m2  SpO2 100%  Discharge Labs:  Results for orders placed or performed during the hospital encounter of 09/08/14 (from the past 24 hour(s))  Glucose, capillary     Status: Abnormal   Collection Time: 09/10/14  5:05 PM  Result Value Ref Range   Glucose-Capillary 154 (H) 70 - 99 mg/dL  Glucose, capillary     Status: None   Collection Time: 09/10/14  9:26 PM  Result Value Ref Range   Glucose-Capillary 81 70 - 99 mg/dL   Comment 1 Notify RN    Comment 2 Documented in Chart   CBC     Status: Abnormal   Collection Time: 09/11/14  5:25 AM  Result Value Ref Range   WBC 8.1 4.0 - 10.5 K/uL   RBC 3.77 (L) 3.87 - 5.11 MIL/uL   Hemoglobin 10.5 (L) 12.0 - 15.0 g/dL   HCT 31.8 (L) 36.0 - 46.0 %   MCV 84.4 78.0 - 100.0 fL   MCH 27.9 26.0 - 34.0 pg   MCHC 33.0 30.0 - 36.0 g/dL   RDW 14.6 11.5 - 15.5 %   Platelets 85 (L) 150 - 400 K/uL  Basic metabolic panel     Status: Abnormal   Collection Time: 09/11/14  5:25 AM  Result Value Ref Range   Sodium 137 137 - 147 mEq/L   Potassium 4.1 3.7 - 5.3 mEq/L   Chloride 103 96 - 112 mEq/L   CO2 23 19 - 32 mEq/L   Glucose, Bld 133 (H) 70 - 99 mg/dL   BUN 10 6 - 23 mg/dL   Creatinine, Ser 0.58 0.50 - 1.10 mg/dL   Calcium 8.6 8.4 - 10.5 mg/dL   GFR calc non Af Amer >90 >90 mL/min   GFR calc Af Amer >90 >90 mL/min   Anion gap 11 5 - 15  Glucose, capillary     Status: Abnormal  Collection Time: 09/11/14  6:44 AM  Result Value Ref Range   Glucose-Capillary 116 (H) 70 - 99 mg/dL  Glucose, capillary     Status: Abnormal   Collection Time: 09/11/14 11:13 AM  Result Value Ref Range   Glucose-Capillary 141 (H) 70 - 99 mg/dL    Comment 1 Notify RN     Signed: Dellia Nims, MD 09/11/2014, 1:08 PM    Services Ordered on Discharge:  Equipment Ordered on Discharge:

## 2014-09-11 NOTE — Progress Notes (Signed)
Patient being d/c to nursing center, report called to the receiving nurse Vernell Barrier. CONDITION STABLE.

## 2014-09-11 NOTE — Progress Notes (Signed)
Subjective: Doing well over night. Had transient confusion overnight but it resolved. No complaints today. Didn't have BM even on lactulose $RemoveBefo'30mg'kCWdaMGtAIP$  daily for last 3 days. Patient is ready to go to SNF. SNF bed is available.  Objective: Vital signs in last 24 hours: Filed Vitals:   09/10/14 2121 09/11/14 0132 09/11/14 0535 09/11/14 0932  BP: 135/71 136/62 111/75 123/55  Pulse: 65 69 79 60  Temp: 99.5 F (37.5 C) 100.5 F (38.1 C) 98.6 F (37 C) 98.8 F (37.1 C)  TempSrc: Oral Oral Oral Oral  Resp: $Remo'18 16 18 20  'DYUMz$ Height:      Weight:    79.516 kg (175 lb 4.8 oz)  SpO2: 98% 100% 98% 100%   Weight change:  No intake or output data in the 24 hours ending 09/11/14 1259 Vitals reviewed. General: sitting in chair, NAD. Awake, no somnolence noted today. HEENT: PERRL, EOMI, no scleral icterus Cardiac: RRR, no rubs, murmurs or gallops Pulm: clear to auscultation bilaterally, no wheezes, rales, or rhonchi Abd: soft, nontender, has distension. BS present Ext: warm and well perfused, no pedal edema Neuro: alert and oriented to time, and self but stated that she is in [place]. cranial nerves II-XII grossly intact, strength and sensation to light touch equal in bilateral upper and lower extremities. No asterixis observed today.   Lab Results: Basic Metabolic Panel:  Recent Labs Lab 09/10/14 0421 09/11/14 0525  NA 139 137  K 4.5 4.1  CL 104 103  CO2 22 23  GLUCOSE 168* 133*  BUN 12 10  CREATININE 0.57 0.58  CALCIUM 8.5 8.6   Liver Function Tests:  Recent Labs Lab 09/08/14 1310  AST 43*  ALT 30  ALKPHOS 257*  BILITOT 0.7  PROT 8.3  ALBUMIN 2.6*   No results for input(s): LIPASE, AMYLASE in the last 168 hours.  Recent Labs Lab 09/08/14 1347  AMMONIA 103*   CBC:  Recent Labs Lab 09/08/14 1310  09/10/14 1100 09/11/14 0525  WBC 3.8*  < > 4.2 8.1  NEUTROABS 2.1  --   --   --   HGB 11.2*  < > 11.8* 10.5*  HCT 34.2*  < > 36.1 31.8*  MCV 87.2  < > 87.6 84.4  PLT  87*  < > 72* 85*  < > = values in this interval not displayed. Cardiac Enzymes: No results for input(s): CKTOTAL, CKMB, CKMBINDEX, TROPONINI in the last 168 hours. BNP: No results for input(s): PROBNP in the last 168 hours. D-Dimer: No results for input(s): DDIMER in the last 168 hours. CBG:  Recent Labs Lab 09/10/14 0654 09/10/14 1127 09/10/14 1705 09/10/14 2126 09/11/14 0644 09/11/14 1113  GLUCAP 140* 142* 154* 81 116* 141*   Hemoglobin A1C:  Recent Labs Lab 09/09/14 0703  HGBA1C 6.6*   Fasting Lipid Panel: No results for input(s): CHOL, HDL, LDLCALC, TRIG, CHOLHDL, LDLDIRECT in the last 168 hours. Thyroid Function Tests:  Recent Labs Lab 09/09/14 0703  TSH 0.337*   Coagulation:  Recent Labs Lab 09/08/14 1310  LABPROT 16.5*  INR 1.32   Anemia Panel:  Recent Labs Lab 09/09/14 0703  VITAMINB12 838  FOLATE 14.9   Urine Drug Screen: Drugs of Abuse  No results found for: LABOPIA, COCAINSCRNUR, LABBENZ, AMPHETMU, THCU, LABBARB  Alcohol Level: No results for input(s): ETH in the last 168 hours. Urinalysis:  Recent Labs Lab 09/08/14 1601  COLORURINE YELLOW  LABSPEC 1.020  PHURINE 6.5  GLUCOSEU >1000*  HGBUR SMALL*  BILIRUBINUR NEGATIVE  Benjamin Stain  NEGATIVE  PROTEINUR NEGATIVE  UROBILINOGEN 2.0*  NITRITE NEGATIVE  LEUKOCYTESUR SMALL*   Misc. Labs:  Micro Results: No results found for this or any previous visit (from the past 240 hour(s)). Studies/Results: No results found. Medications: I have reviewed the patient's current medications. Scheduled Meds: . amLODipine  10 mg Oral Daily  . ARIPiprazole  10 mg Oral Daily  . carvedilol  3.125 mg Oral BID WC  . insulin aspart  0-9 Units Subcutaneous TID WC  . lactulose  30 g Oral Daily  . latanoprost  1 drop Both Eyes QHS  . Ledipasvir-Sofosbuvir  1 tablet Oral Daily  . lisinopril  10 mg Oral Daily  . montelukast  10 mg Oral Daily  . oxybutynin  5 mg Oral BID  . PARoxetine  20 mg Oral Daily    . traZODone  25 mg Oral QHS   Continuous Infusions:  PRN Meds:. Assessment/Plan: Active Problems:   Weakness   Acute encephalopathy   Hepatic encephalopathy   Chronic hepatitis C without hepatic coma   Essential hypertension  68 yo female with Hep C, cirrhosis, dementia here with acute encephalopathy most likely 2/2 to hepatic cause.   Hepatic encephalopathy - improving- ammonia 103. Other causes unlikely as CT negative. UA negative. Electrolytes are normal. Has mild ast/alt elev. Has high alk phos. - received lactulose enema and had BM with it. Was on $Remo'30mg'rdljv$  daily PO lactulose without any BM last 3 days. Will give extra dose today before discharge to SNF. At SNF she can get lactulose $RemoveBefor'30mg'ZgNrAZKbZIVP$  daily with titration PRN to have 2-3 BM daily.  - limited ab u/s showed no ascites.  -  b12 and folate normal., TSH low. Needs outpatient follow up for low TSH.   Diastolic CHF - echo 93/23/55 EF 60-65%, mild LVH, grade 2 diastolic. - appears to be volume overloaded on lower extremities. This could also be cause of hepatic encephalopathy with hepatic vascular congestion.  - on norvasc, lisinopril. Add asa on discharge. Will also add co-reg 3.$RemoveBefo'125mg'hMQZxQZITqh$  bid..   Leukopenia and thrombocytopenia 2/2 to lab abnormality. Repeat lab shows stable wbc and hgb. plt low 72 from 90.. But today back to 85.  - concerned for HIIT. Stopped lovenox.repeated CBC shows plt improved to baseline ~85.  Early Dementia - unclear etiology. Will get labs to rule out reversible causes as above. Increasing forgetfulness. Hasn't affected adl's yet. PT eval done, needs snf level care.  Hep C - on treatment with harvoni.brother brought her home med here. Will continue giving this.  Diabetes II - hgba1c 6.6. Was diet controlled.  - Will add metformin on discharge $RemoveBefo'500mg'QWbkcENCqDW$  BID.  Depression - cont abilify, paxil.   HTN - on amlodopine, lisinopril at home - continue these   Dispo: Disposition is deferred at this time, awaiting  improvement of current medical problems.  Anticipated discharge in approximately 1-2 day(s).   The patient does have a current PCP Barton Fanny, MD) and does need an [facility] hospital follow-up appointment after discharge.  The patient does have transportation limitations that hinder transportation to clinic appointments.  .Services Needed at time of discharge: Y = Yes, Blank = No PT:   OT:   RN:   Equipment:   Other:     LOS: 3 days   Dellia Nims, MD 09/11/2014, 12:59 PM

## 2014-09-11 NOTE — Plan of Care (Signed)
Problem: Phase III Progression Outcomes Goal: Voiding independently Outcome: Completed/Met Date Met:  09/11/14

## 2014-09-11 NOTE — Progress Notes (Signed)
Subjective:  Patient continues to improve. She feels that she is back to her baseline. After discussing with the patient and her brother, they have decided to have her transferred to a SNIF to help her get her strength back. She will be going to [facility]. She reports that she still has not had a BM since Tuesday evening. She denies any abdominal pain, chest pain, headaches, or shob.  Objective: Vital signs in last 24 hours: Filed Vitals:   09/10/14 2121 09/11/14 0132 09/11/14 0535 09/11/14 0932  BP: 135/71 136/62 111/75 123/55  Pulse: 65 69 79 60  Temp: 99.5 F (37.5 C) 100.5 F (38.1 C) 98.6 F (37 C) 98.8 F (37.1 C)  TempSrc: Oral Oral Oral Oral  Resp: 18 16 18 20   Height:      Weight:    79.516 kg (175 lb 4.8 oz)  SpO2: 98% 100% 98% 100%    Intake/Output Summary (Last 24 hours) at 09/11/14 1237 Last data filed at 09/10/14 1238  Gross per 24 hour  Intake    200 ml  Output      0 ml  Net    200 ml   BP 123/55 mmHg  Pulse 60  Temp(Src) 98.8 F (37.1 C) (Oral)  Resp 20  Ht 5\' 2"  (1.575 m)  Wt 79.516 kg (175 lb 4.8 oz)  BMI 32.05 kg/m2  SpO2 100%   Vital signs reviewed and are stable. General appearance: alert, cooperative and dressed and awaiting discharge Lungs: clear to auscultation bilaterally Heart: regular rate and rhythm, S1, S2 normal, no murmur, click, rub or gallop Abdomen: soft, non-tender; bowel sounds normal; no masses,  no organomegaly Extremities: extremities normal, atraumatic, no cyanosis or edema Neurologic: Mental status: orientation: time, date, person, place, president, thought she was in [place].   Lab Results:  Basic Metabolic Panel:  Recent Labs Lab 09/10/14 0421 09/11/14 0525  NA 139 137  K 4.5 4.1  CL 104 103  CO2 22 23  GLUCOSE 168* 133*  BUN 12 10  CREATININE 0.57 0.58  CALCIUM 8.5 8.6   Liver Function Tests:  Recent Labs Lab 09/08/14 1310  AST 43*  ALT 30  ALKPHOS 257*  BILITOT 0.7  PROT  8.3  ALBUMIN 2.6*    Recent Labs Lab 09/08/14 1347  AMMONIA 103*   CBC:  Recent Labs Lab 09/08/14 1310  09/10/14 1100 09/11/14 0525  WBC 3.8*  < > 4.2 8.1  NEUTROABS 2.1  --   --   --   HGB 11.2*  < > 11.8* 10.5*  HCT 34.2*  < > 36.1 31.8*  MCV 87.2  < > 87.6 84.4  PLT 87*  < > 72* 85*  < > = values in this interval not displayed.   CBG:  Recent Labs Lab 09/10/14 0654 09/10/14 1127 09/10/14 1705 09/10/14 2126 09/11/14 0644 09/11/14 1113  GLUCAP 140* 142* 154* 81 116* 141*   Hemoglobin A1C:  Recent Labs Lab 09/09/14 0703  HGBA1C 6.6*   Thyroid Function Tests:  Recent Labs Lab 09/09/14 0703  TSH 0.337*   Coagulation:  Recent Labs Lab 09/08/14 1310  LABPROT 16.5*  INR 1.32   Anemia Panel:  Recent Labs Lab 09/09/14 0703  VITAMINB12 838  FOLATE 14.9   Urinalysis:  Recent Labs Lab 09/08/14 1601  COLORURINE YELLOW  LABSPEC 1.020  PHURINE 6.5  GLUCOSEU >1000*  HGBUR SMALL*  BILIRUBINUR NEGATIVE  KETONESUR NEGATIVE  PROTEINUR NEGATIVE  UROBILINOGEN 2.0*  NITRITE  NEGATIVE  LEUKOCYTESUR SMALL*   Medications: I have reviewed the patient's current medications. Scheduled Meds: . amLODipine  10 mg Oral Daily  . ARIPiprazole  10 mg Oral Daily  . carvedilol  3.125 mg Oral BID WC  . insulin aspart  0-9 Units Subcutaneous TID WC  . lactulose  30 g Oral Daily  . latanoprost  1 drop Both Eyes QHS  . Ledipasvir-Sofosbuvir  1 tablet Oral Daily  . lisinopril  10 mg Oral Daily  . montelukast  10 mg Oral Daily  . oxybutynin  5 mg Oral BID  . PARoxetine  20 mg Oral Daily  . traZODone  25 mg Oral QHS   Assessment/Plan: Active Problems:   Weakness   Acute encephalopathy   Hepatic encephalopathy   Chronic hepatitis C without hepatic coma   Essential hypertension  68 y.o. 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  1.) Acute Encephalopathy -  Resolved. Continue PO Lactulose  30mg    2.) Constipation -  Patient has not had a BM since Tuesday night. Pt given an additional 30mg  dose of lactulose before being discharged.  3.) Volume Status -  Volume overload has resolved.   4.) Hep C -  Continue Harvoni  5.) Early dementia -  Still unclear etiology. Outpt f/u  6.) HTN -  BP has been in the 139/70 range. Continue home lisinopril and amlodipine.  Dispo: Patient to be discharged to [facility] [place] patient does have a current PCP Barton Fanny, MD) and does need an [facility] hospital follow-up appointment after discharge.  The patient does have transportation limitations that hinder transportation to clinic appointments.  .Services Needed at time of discharge: Y = Yes, Blank = No PT:   OT:   RN:   Equipment:   Other:     LOS: 3 days   Farrell Ours, Med Student 09/11/2014, 12:37 PM

## 2014-09-11 NOTE — Plan of Care (Signed)
Problem: Acute Rehab PT Goals(only PT should resolve) Goal: Pt Will Go Up/Down Stairs Outcome: Not Applicable Date Met:  23/30/07

## 2014-09-11 NOTE — Progress Notes (Signed)
Physical Therapy Treatment Patient Details Name: Carol Rubio MRN: [id number] DOB: 1945/11/21 Today's Date: 09/11/2014    History of Present Illness Carol Rubio is a 68 y.o. female admitted 09/08/14 for lethargy. PMH of DM, dementia, HTN, Hep C, cirrhosis, LE edema. Pt was seen at Hep C clinic and sent to ED for lethargy.     PT Comments    Patient progressing well with mobility. Improved ambulation distance today and tolerated short distances without use of RW. Balance improved with use of RW. Pt continues to exhibit difficulties with short term memory as pt only able to recall 1/3 words within session. Frequency decreased as pt agreeable to go to SNF for short period. Will continue to follow and progress as tolerated. Goals will be updated as pt does not need to negotiate 1 flight of steps if going to rehab.   Follow Up Recommendations  SNF;Supervision/Assistance - 24 hour     Equipment Recommendations  None recommended by PT    Recommendations for Other Services       Precautions / Restrictions Precautions Precautions: Fall Restrictions Weight Bearing Restrictions: No    Mobility  Bed Mobility Overal bed mobility: Needs Assistance Bed Mobility: Supine to Sit     Supine to sit: HOB elevated;Supervision     General bed mobility comments: Supervision for safety.   Transfers Overall transfer level: Needs assistance Equipment used: Rolling walker (2 wheeled) Transfers: Sit to/from Stand Sit to Stand: Min guard         General transfer comment: Min guard for safety. No dizziness reported upon standing. Stood from toilet mod I (even after being told by therapist to ask for assist to stand).  Ambulation/Gait Ambulation/Gait assistance: Min guard Ambulation Distance (Feet): 150 Feet (+50') Assistive device: Rolling walker (2 wheeled);None Gait Pattern/deviations: Step-through pattern;Decreased stride length;Drifts right/left;Trunk flexed Gait velocity: slow    General Gait Details: Ambulated with RW and without RW. Balance improved with use of RW for support. Drifting and unsteadiness noted ambulating without RW.    Stairs Stairs: Yes Stairs assistance: Min guard Stair Management: Two rails;Alternating pattern Number of Stairs: 2 (+ 3 step x2 times) General stair comments: Tolerated stair negotiation with BUEs supported on rails. More difficulty with normal height step that smaller step height. Min guard for balance/safety.  Wheelchair Mobility    Modified Rankin (Stroke Patients Only)       Balance Overall balance assessment: Needs assistance Sitting-balance support: Feet supported;No upper extremity supported Sitting balance-Leahy Scale: Good Sitting balance - Comments: Able to donn socks sitting EOB reaching out side BOS and weightshifting without LOB.   Standing balance support: During functional activity Standing balance-Leahy Scale: Fair Standing balance comment: Tolerated short periods of dynamic standing without UE support - washing hands however recommend RW for longer distances for balance/safety.                     Cognition Arousal/Alertness: Awake/alert Behavior During Therapy: WFL for tasks assessed/performed Overall Cognitive Status: Within Functional Limits for tasks assessed       Memory: Decreased short-term memory (Only able to recall 1/3 words within session. Required verbal cues to recall other 2 words pt told to remember.)              Exercises      General Comments General comments (skin integrity, edema, etc.): Pt verbalizes her memory is worsening. "it is not as good as it used to be."      Pertinent Vitals/Pain Pain  Assessment: No/denies pain    Home Living                      Prior Function            PT Goals (current goals can now be found in the care plan section) Progress towards PT goals: Progressing toward goals    Frequency  Min 2X/week    PT Plan  Current plan remains appropriate;Frequency needs to be updated    Co-evaluation             End of Session Equipment Utilized During Treatment: Gait belt Activity Tolerance: Patient tolerated treatment well Patient left: in chair;with call bell/phone within reach;with chair alarm set     Time: 1006-1030 PT Time Calculation (min) (ACUTE ONLY): 24 min  Charges:  $Gait Training: 8-22 mins $Therapeutic Activity: 8-22 mins                    G CodesCandy Rubio A 2014/10/09, 12:25 PM Carol Rubio

## 2014-09-11 NOTE — Plan of Care (Signed)
Problem: Phase II Progression Outcomes Goal: Other Phase II Outcomes/Goals Outcome: Completed/Met Date Met:  09/11/14

## 2014-09-11 NOTE — Progress Notes (Signed)
Patient and pt's family accepted bed offer from [facility]. Facility's Liaison to complete admissions paperwork with family at hospital.  [facility] will continue to follow for support and to facilitate pt's discharge needs once medically stable.  Glendon Axe, MSW, [place] [phone] 09/11/2014 12:27 PM

## 2014-09-14 ENCOUNTER — Encounter: Payer: Self-pay | Admitting: Adult Health

## 2014-09-14 ENCOUNTER — Non-Acute Institutional Stay (SKILLED_NURSING_FACILITY): Payer: Medicare Other | Admitting: Adult Health

## 2014-09-14 DIAGNOSIS — E119 Type 2 diabetes mellitus without complications: Secondary | ICD-10-CM | POA: Diagnosis not present

## 2014-09-14 DIAGNOSIS — K7290 Hepatic failure, unspecified without coma: Secondary | ICD-10-CM

## 2014-09-14 DIAGNOSIS — I10 Essential (primary) hypertension: Secondary | ICD-10-CM | POA: Diagnosis not present

## 2014-09-14 DIAGNOSIS — G4700 Insomnia, unspecified: Secondary | ICD-10-CM

## 2014-09-14 DIAGNOSIS — F329 Major depressive disorder, single episode, unspecified: Secondary | ICD-10-CM

## 2014-09-14 DIAGNOSIS — F419 Anxiety disorder, unspecified: Secondary | ICD-10-CM | POA: Diagnosis not present

## 2014-09-14 DIAGNOSIS — K7682 Hepatic encephalopathy: Secondary | ICD-10-CM

## 2014-09-14 DIAGNOSIS — I5032 Chronic diastolic (congestive) heart failure: Secondary | ICD-10-CM

## 2014-09-14 DIAGNOSIS — F0390 Unspecified dementia without behavioral disturbance: Secondary | ICD-10-CM

## 2014-09-14 DIAGNOSIS — B182 Chronic viral hepatitis C: Secondary | ICD-10-CM | POA: Diagnosis not present

## 2014-09-14 DIAGNOSIS — J3089 Other allergic rhinitis: Secondary | ICD-10-CM

## 2014-09-14 DIAGNOSIS — N3281 Overactive bladder: Secondary | ICD-10-CM

## 2014-09-14 DIAGNOSIS — F32A Depression, unspecified: Secondary | ICD-10-CM

## 2014-09-16 DIAGNOSIS — J309 Allergic rhinitis, unspecified: Secondary | ICD-10-CM | POA: Insufficient documentation

## 2014-09-16 DIAGNOSIS — G4700 Insomnia, unspecified: Secondary | ICD-10-CM | POA: Insufficient documentation

## 2014-09-16 DIAGNOSIS — I5032 Chronic diastolic (congestive) heart failure: Secondary | ICD-10-CM | POA: Insufficient documentation

## 2014-09-16 DIAGNOSIS — N3281 Overactive bladder: Secondary | ICD-10-CM | POA: Insufficient documentation

## 2014-09-16 DIAGNOSIS — F419 Anxiety disorder, unspecified: Secondary | ICD-10-CM | POA: Insufficient documentation

## 2014-09-16 DIAGNOSIS — F0390 Unspecified dementia without behavioral disturbance: Secondary | ICD-10-CM | POA: Insufficient documentation

## 2014-09-16 DIAGNOSIS — E119 Type 2 diabetes mellitus without complications: Secondary | ICD-10-CM | POA: Insufficient documentation

## 2014-09-16 DIAGNOSIS — F32A Depression, unspecified: Secondary | ICD-10-CM | POA: Insufficient documentation

## 2014-09-16 DIAGNOSIS — F329 Major depressive disorder, single episode, unspecified: Secondary | ICD-10-CM | POA: Insufficient documentation

## 2014-09-16 NOTE — Progress Notes (Addendum)
Patient ID: Carol Rubio, female   DOB: 12-01-1945, 68 y.o.   MRN: [id number]   09/14/14  Facility:  Nursing Home Location:  [place] Room Number: 509-P LEVEL OF CARE:  SNF (31)   Chief Complaint  Patient presents with  . Hospitalization Follow-up    Hepatic encephalopathy, Hepatitis C, Diabetes mellitus, Hypertension, Anxiety, Depression, Dementia, Diastolic CHF, Allergic rhinitis, Overactive bladder and Insomnia    HISTORY OF PRESENT ILLNESS: This is a 68 year old  Female who has been admitted to [facility] on 09/11/14 from [facility] with hepatic encephalopathy.  Patient has been having drowsiness and lethargy. She was treated with lactulose enema and PO, as well. She was treated for volume overload which can also be cause of hepatic encephalopathy. She has been admitted for a short-term rehabilitation  REASSESSMENT OF ONGOING PROBLEMS:   HTN: Pt 's HTN remains stable.  Denies CP, sob, DOE, headaches, dizziness or visual disturbances.  No complications from the medications currently being used.  Last BP : 122/72  CHF:The patient does not relate significant weight changes, denies sob, DOE, orthopnea, PNDs, palpitations or chest pain.  CHF remains stable.  No complications form the medications being used.  DEPRESSION: The depression remains stable. Patient denies ongoing feelings of sadness, insomnia, anedhonia or lack of appetite. No complications reported from the medications currently being used. Staff do not report behavioral problems.   DM:pt's DM remains stable.  Pt denies polyuria, polydipsia, polyphagia, changes in vision or hypoglycemic episodes.  No complications noted from the medication presently being used.   11/15 hemoglobin A1c is:6.6  PAST MEDICAL HISTORY:  Past Medical History  Diagnosis Date  . Anxiety   . Depression   . Cataract     had surgery for  . Diabetes mellitus   . Hypertension   . Arthritis    SHOULDER,KNEES,BACK    CURRENT MEDICATIONS: Reviewed per MAR/see medication list  No Known Allergies   REVIEW OF SYSTEMS:  GENERAL: no change in appetite, no fatigue, no weight changes, no fever, chills or weakness RESPIRATORY: no cough, SOB, DOE, wheezing, hemoptysis CARDIAC: no chest pain, or palpitations, +edema GI: no abdominal pain, diarrhea, constipation, heart burn, nausea or vomiting  PHYSICAL EXAMINATION  GENERAL: no acute distress, normal body habitus EYES: conjunctivae normal, sclerae normal, normal eye lids NECK: supple, trachea midline, no neck masses, no thyroid tenderness, no thyromegaly LYMPHATICS: no LAN in the neck, no supraclavicular LAN RESPIRATORY: breathing is even & unlabored, BS CTAB CARDIAC: RRR, no murmur,no extra heart sounds, BLE edema 2+ GI: abdomen soft, normal BS, no masses, no tenderness, no hepatomegaly, no splenomegaly EXTREMITIES: able to move all 4 extremities PSYCHIATRIC: the patient is alert & oriented to person, affect & behavior appropriate  LABS/RADIOLOGY: Labs reviewed: Basic Metabolic Panel:  Recent Labs  09/09/14 0703 09/10/14 0421 09/11/14 0525  NA 143 139 137  K 3.4* 4.5 4.1  CL 107 104 103  CO2 26 22 23   GLUCOSE 132* 168* 133*  BUN 11 12 10   CREATININE 0.59 0.57 0.58  CALCIUM 8.8 8.5 8.6   Liver Function Tests:  Recent Labs  09/08/14 1310  AST 43*  ALT 30  ALKPHOS 257*  BILITOT 0.7  PROT 8.3  ALBUMIN 2.6*    CBC:  Recent Labs  09/08/14 1310  09/10/14 0421 09/10/14 1100 09/11/14 0525  WBC 3.8*  < > 1.9* 4.2 8.1  NEUTROABS 2.1  --   --   --   --  HGB 11.2*  < > 17.2* 11.8* 10.5*  HCT 34.2*  < > 52.5* 36.1 31.8*  MCV 87.2  < > 86.6 87.6 84.4  PLT 87*  < > 32* 72* 85*  < > = values in this interval not displayed.  CBG:  Recent Labs  09/11/14 0644 09/11/14 1113 09/11/14 1625  GLUCAP 116* 141* 168*    Dg Chest 2 View  09/08/2014   CLINICAL DATA:  Altered mental status.  EXAM: CHEST  2 VIEW   COMPARISON:  06/16/2013  FINDINGS: Borderline cardiomegaly. Negative aortic and hilar contours. Diffuse interstitial coarsening with cephalized blood flow. No Kerley lines or pleural effusion. No focal opacity confirmed on both views.  IMPRESSION: Bilateral interstitial opacity, favored congestive rather than infectious.   Electronically Signed   By: Jorje Guild M.D.   On: 09/08/2014 16:05   Ct Head Wo Contrast  09/08/2014   CLINICAL DATA:  Insomnia.  Weakness for 3-4 weeks.  Memory problems.  EXAM: CT HEAD WITHOUT CONTRAST  TECHNIQUE: Contiguous axial images were obtained from the base of the skull through the vertex without intravenous contrast.  COMPARISON:  Brain MRI, 05/03/2012 and head CT, 10/05/2011.  FINDINGS: Ventricles are normal in configuration. There is ventricular and sulcal enlargement reflecting mild atrophy. No hydrocephalus.  No parenchymal masses or mass effect. Patchy areas of white matter hypoattenuation are noted consistent with mild chronic microvascular ischemic change.  There is no evidence of a cortical infarct.  There are no extra-axial masses or abnormal fluid collections.  No intracranial hemorrhage.  Visualized sinuses and mastoid air cells are clear. No skull lesion.  IMPRESSION: 1. No acute intracranial abnormalities. 2. Mild atrophy and chronic microvascular ischemic change.   Electronically Signed   By: Lajean Manes M.D.   On: 09/08/2014 16:17   US Abdomen Limited  09/08/2014   CLINICAL DATA:  Hepatic encephalopathy.  EXAM: LIMITED ABDOMEN ULTRASOUND FOR ASCITES  TECHNIQUE: Limited ultrasound survey for ascites was performed in all four abdominal quadrants.  COMPARISON:  Abdominal ultrasound 08/04/2014.  FINDINGS: No ascites is identified.  IMPRESSION: Negative for ascites.   Electronically Signed   By: Inge Rise M.D.   On: 09/08/2014 23:29    ASSESSMENT/PLAN:  Hepatic encephalopathy - improving;  continue lactulose 30 mg/45 ML by mouth daily Diabetes  mellitus, type II - well controlled; recently started on metformin 500 mg by mouth twice a day Hypertension - well controlled; continue amlodipine 10 milligrams daily, lisinopril 10 mg daily and Coreg 3.125 mg by mouth twice a day Hepatitis C -continue treatment with Harvoni Anxiety - stable; continue Paxil 20 mg daily Depression - stable; continue Abilify 10 mg by mouth daily Dementia - stable; no medication Diastolic CHF - stable; continue Coreg 3.125 mg twice a day and Lasix 40 mg daily Allergic rhinitis - stable; continue Singulair 10 mg by mouth daily Overactive bladder - continue Ditropan 5 mg by mouth twice a day Insomnia - continue Desyrel 25 mg 1 tab by mouth daily at bedtime   Spent 50 minutes in patient care.     [facility], NP [employer] [phone]

## 2014-09-17 ENCOUNTER — Non-Acute Institutional Stay (SKILLED_NURSING_FACILITY): Payer: Medicare Other | Admitting: Internal Medicine

## 2014-09-17 DIAGNOSIS — I5032 Chronic diastolic (congestive) heart failure: Secondary | ICD-10-CM

## 2014-09-17 DIAGNOSIS — F329 Major depressive disorder, single episode, unspecified: Secondary | ICD-10-CM

## 2014-09-17 DIAGNOSIS — R946 Abnormal results of thyroid function studies: Secondary | ICD-10-CM

## 2014-09-17 DIAGNOSIS — R600 Localized edema: Secondary | ICD-10-CM

## 2014-09-17 DIAGNOSIS — F32A Depression, unspecified: Secondary | ICD-10-CM

## 2014-09-17 DIAGNOSIS — E119 Type 2 diabetes mellitus without complications: Secondary | ICD-10-CM | POA: Diagnosis not present

## 2014-09-17 DIAGNOSIS — K7290 Hepatic failure, unspecified without coma: Secondary | ICD-10-CM | POA: Diagnosis not present

## 2014-09-17 DIAGNOSIS — B182 Chronic viral hepatitis C: Secondary | ICD-10-CM

## 2014-09-17 DIAGNOSIS — I10 Essential (primary) hypertension: Secondary | ICD-10-CM

## 2014-09-17 DIAGNOSIS — K7682 Hepatic encephalopathy: Secondary | ICD-10-CM

## 2014-09-17 DIAGNOSIS — R5381 Other malaise: Secondary | ICD-10-CM | POA: Diagnosis not present

## 2014-09-17 NOTE — Progress Notes (Signed)
Patient ID: Carol Rubio, female   DOB: 08/21/1946, 68 y.o.   MRN: [id number]     [facility]   PCP: Ellsworth Lennox, MD  Code Status: full code  No Known Allergies  Chief Complaint  Patient presents with  . New Admit To SNF     HPI:  68 y/o female pt is here for STR after hospital admission from 09/08/14-09/11/14 with increased lethargy in setting of hepatic encephalopathy from hepatitis clinic. She was noted to have elevated ammonia level and had gained close to 20 lbs. CT head showed no acute changes besides chronic microvascular ischemic change. Infectious etiology was ruled out. She responded well to lactulose.  She has PMH of HTN, diet controlled DM, dementia, Hep C (on harvoni for last 6 weeks), cirrhosis. She is here for rehabilitation. She is seen in her room today. Her brother and sister are present in the room. She mentions that her energy level has improved. She is alert and oriented. She has been having 3 bowel movement a day. Denies any abdominal pain. No concern from other family member. No new concern from staff.   Review of Systems:  Constitutional: Negative for fever, chills, malaise/fatigue and diaphoresis.  HENT: Negative for congestion Eyes: Negative for eye pain, blurred vision, double vision and discharge.  Respiratory: Negative for cough, shortness of breath and wheezing.   Cardiovascular: Negative for chest pain, palpitations, orthopnea. has leg swelling.  Gastrointestinal: Negative for heartburn, nausea, vomiting, abdominal pain. Appetite fair Genitourinary: Negative for dysuria  Musculoskeletal: Negative for back pain, falls. Uses a walker Skin: Negative for itching, rash.  Neurological: Negative for dizziness, tingling, focal weakness and headaches.  Psychiatric/Behavioral: Negative for depression  Past Medical History  Diagnosis Date  . Anxiety   . Depression   . Cataract     had surgery for  . Diabetes  mellitus   . Hypertension   . Arthritis     SHOULDER,KNEES,BACK   Past Surgical History  Procedure Laterality Date  . Cholecystectomy    . Colostomy    . Polypectomy    . Cataract extraction w/ intraocular lens  implant, bilateral     Social History:   reports that she has quit smoking. She has never used smokeless tobacco. She reports that she does not drink alcohol or use illicit drugs.  Family History  Problem Relation Age of Onset  . Stroke Mother   . Cancer Father   . Diabetes Sister   . Stroke Sister     Medications: Patient's Medications  New Prescriptions   No medications on file  Previous Medications   ABILIFY 10 MG TABLET    Take 10 mg by mouth daily.    AMLODIPINE (NORVASC) 10 MG TABLET    Take 10 mg by mouth daily.   ASPIRIN EC 81 MG TABLET    Take 1 tablet (81 mg total) by mouth daily.   CARVEDILOL (COREG) 3.125 MG TABLET    Take 1 tablet (3.125 mg total) by mouth 2 (two) times daily with a meal.   FUROSEMIDE (LASIX) 40 MG TABLET    Take 40 mg by mouth daily.    HARVONI 90-400 MG TABS    Take 1 tablet by mouth daily.    LACTULOSE (CHRONULAC) 10 GM/15ML SOLUTION    Take 45-90 mLs (30-60 g total) by mouth daily as needed for mild constipation (titrate to have 2-3 Bowel movements daily.).   LATANOPROST (XALATAN) 0.005 % OPHTHALMIC SOLUTION    Place  1 drop into both eyes at bedtime.    LISINOPRIL (PRINIVIL,ZESTRIL) 10 MG TABLET    Take 10 mg by mouth daily.   METFORMIN (GLUCOPHAGE) 500 MG TABLET    Take 1 tablet (500 mg total) by mouth 2 (two) times daily with a meal.   MONTELUKAST (SINGULAIR) 10 MG TABLET    Take 1 tablet by mouth daily.   OXYBUTYNIN (DITROPAN) 5 MG TABLET    Take 5 mg by mouth 2 (two) times daily.    PAROXETINE (PAXIL) 20 MG TABLET    Take 20 mg by mouth daily.   TRAZODONE (DESYREL) 50 MG TABLET    Take 25 mg by mouth at bedtime.  Modified Medications   No medications on file  Discontinued Medications   No medications on file     Physical  Exam: Filed Vitals:   09/17/14 1142  BP: 122/72  Pulse: 72  Temp: 98 F (36.7 C)  Resp: 18  Weight: 176 lb 3.2 oz (79.924 kg)  SpO2: 98%    General- elderly female in no acute distress Head- atraumatic, normocephalic Eyes- eye lid puffiness present, no icterus, no discharge Neck- no cervical lymphadenopathy Throat- moist mucus membrane Cardiovascular- normal s1,s2, no murmurs Respiratory- bilateral clear to auscultation, no wheeze, no rhonchi, no crackles, no use of accessory muscles Abdomen- bowel sounds present, soft, non tender Musculoskeletal- able to move all 4 extremities, 2+ leg edema Neurological- no focal deficit, no asterexis Skin- warm and dry Psychiatry- alert and oriented, normal mood and affect   Labs reviewed: Basic Metabolic Panel:  Recent Labs  09/09/14 0703 09/10/14 0421 09/11/14 0525  NA 143 139 137  K 3.4* 4.5 4.1  CL 107 104 103  CO2 26 22 23   GLUCOSE 132* 168* 133*  BUN 11 12 10   CREATININE 0.59 0.57 0.58  CALCIUM 8.8 8.5 8.6   Liver Function Tests:  Recent Labs  09/08/14 1310  AST 43*  ALT 30  ALKPHOS 257*  BILITOT 0.7  PROT 8.3  ALBUMIN 2.6*   No results for input(s): LIPASE, AMYLASE in the last 8760 hours.  Recent Labs  09/08/14 1347  AMMONIA 103*   CBC:  Recent Labs  09/08/14 1310  09/10/14 0421 09/10/14 1100 09/11/14 0525  WBC 3.8*  < > 1.9* 4.2 8.1  NEUTROABS 2.1  --   --   --   --   HGB 11.2*  < > 17.2* 11.8* 10.5*  HCT 34.2*  < > 52.5* 36.1 31.8*  MCV 87.2  < > 86.6 87.6 84.4  PLT 87*  < > 32* 72* 85*  < > = values in this interval not displayed. Cardiac Enzymes: No results for input(s): CKTOTAL, CKMB, CKMBINDEX, TROPONINI in the last 8760 hours. BNP: Invalid input(s): POCBNP CBG:  Recent Labs  09/11/14 0644 09/11/14 1113 09/11/14 1625  GLUCAP 116* 141* 168*    Radiological Exams: 2D Echo: Normal LV size with EF 60-65%. Mild LV hypertrophy. Normal RV   size and systolic function. No significant  valvular   abnormalities.grade 2 diastolic dysfunction   Assessment/Plan  Physical deconditioning Will have her work with physical therapy and occupational therapy team to help with gait training and muscle strengthening exercises.fall precautions. Skin care. Encourage to be out of bed.   Hepatic encephalopathy Continue lactulose current regimen, monitor bowel movement. Consider adding rifaxamin for long term with her history of liver cirrhosis. She is followed in hepatitis clinic, will defer this to the specialist to consider  Abnormal thyroid level  She was noted to have low TSH in hospital. Recheck thyroid panel   Depression  continue her abilify and paxil with trazodone  HTN Continue amlodpine and lisinopril with coreg, monitor bp. Continue aspirin  Leg edema In setting of chf. Continue lasix, monitor bmp  Hep C  on treatment with harvoni.  Diabetes II  hgba1c 6.6. Continue metformin 500mg  BID. Monitor cbg  CHF Monitor daily weight. Continue asa, coreg, lisinopril for now  Family/ staff Communication: reviewed care plan with patient and nursing supervisor   Goals of care: short term rehabilitation    Labs/tests ordered: cbc, cmp    Blanchie Serve, MD  [place] [phone] (Monday-Friday 8 am - 5 pm) [phone] (afterhours)

## 2014-09-21 ENCOUNTER — Other Ambulatory Visit: Payer: Self-pay | Admitting: *Deleted

## 2014-09-21 MED ORDER — HYDROCODONE-ACETAMINOPHEN 5-325 MG PO TABS: 1.0000 | ORAL_TABLET | Freq: Four times a day (QID) | ORAL | Status: DC | PRN

## 2014-09-22 DIAGNOSIS — B0239 Other herpes zoster eye disease: Secondary | ICD-10-CM | POA: Diagnosis not present

## 2014-09-22 DIAGNOSIS — H4011X1 Primary open-angle glaucoma, mild stage: Secondary | ICD-10-CM | POA: Diagnosis not present

## 2014-09-22 DIAGNOSIS — Z961 Presence of intraocular lens: Secondary | ICD-10-CM | POA: Diagnosis not present

## 2014-09-29 ENCOUNTER — Non-Acute Institutional Stay (SKILLED_NURSING_FACILITY): Payer: Medicare Other | Admitting: Adult Health

## 2014-09-29 ENCOUNTER — Encounter: Payer: Self-pay | Admitting: Adult Health

## 2014-09-29 DIAGNOSIS — F0390 Unspecified dementia without behavioral disturbance: Secondary | ICD-10-CM

## 2014-09-29 DIAGNOSIS — B182 Chronic viral hepatitis C: Secondary | ICD-10-CM

## 2014-09-29 DIAGNOSIS — G9340 Encephalopathy, unspecified: Secondary | ICD-10-CM

## 2014-09-29 DIAGNOSIS — I5032 Chronic diastolic (congestive) heart failure: Secondary | ICD-10-CM | POA: Diagnosis not present

## 2014-09-29 DIAGNOSIS — R5381 Other malaise: Secondary | ICD-10-CM | POA: Diagnosis not present

## 2014-09-29 DIAGNOSIS — K7290 Hepatic failure, unspecified without coma: Secondary | ICD-10-CM | POA: Diagnosis not present

## 2014-09-29 DIAGNOSIS — F329 Major depressive disorder, single episode, unspecified: Secondary | ICD-10-CM | POA: Diagnosis not present

## 2014-09-29 DIAGNOSIS — G4700 Insomnia, unspecified: Secondary | ICD-10-CM

## 2014-09-29 DIAGNOSIS — I10 Essential (primary) hypertension: Secondary | ICD-10-CM | POA: Diagnosis not present

## 2014-09-29 DIAGNOSIS — J3089 Other allergic rhinitis: Secondary | ICD-10-CM

## 2014-09-29 DIAGNOSIS — R946 Abnormal results of thyroid function studies: Secondary | ICD-10-CM | POA: Diagnosis not present

## 2014-09-29 DIAGNOSIS — E119 Type 2 diabetes mellitus without complications: Secondary | ICD-10-CM

## 2014-09-29 DIAGNOSIS — F32A Depression, unspecified: Secondary | ICD-10-CM

## 2014-09-29 DIAGNOSIS — F419 Anxiety disorder, unspecified: Secondary | ICD-10-CM

## 2014-09-29 DIAGNOSIS — B029 Zoster without complications: Secondary | ICD-10-CM

## 2014-09-29 DIAGNOSIS — K7682 Hepatic encephalopathy: Secondary | ICD-10-CM

## 2014-09-29 DIAGNOSIS — N3281 Overactive bladder: Secondary | ICD-10-CM

## 2014-09-29 HISTORY — DX: Encephalopathy, unspecified: G93.40

## 2014-09-29 NOTE — Progress Notes (Signed)
Patient ID: Carol Rubio, female   DOB: 1945-12-21, 68 y.o.   MRN: [id number]     09/29/14  Facility:  Nursing Home Location:  [place] Room Number: 509-P LEVEL OF CARE:  SNF (31)   Chief Complaint  Patient presents with  . Discharge Note    Physical deconditioning, hepatic encephalopathy, diabetes mellitus, hypertension, anxiety, depression, dementia, diastolic CHF, allergic rhinitis, overactive bladder, insomnia, abnormal thyroid level and shingles    HISTORY OF PRESENT ILLNESS: This is a 67 year old  female who is for discharge home with home health PT, OT, nursing and speech therapy. She has been admitted to [facility] on 09/11/14 from [facility] with hepatic encephalopathy.  Patient has been having drowsiness and lethargy. She was treated with lactulose enema and PO, as well. She was treated for volume overload which can also be cause of hepatic encephalopathy. Patient was admitted to this facility for short-term rehabilitation due to Physical Deconditioning after the patient's recent hospitalization.  Patient has completed SNF rehabilitation and therapy has cleared the patient for discharge.  REASSESSMENT OF ONGOING PROBLEMS:   HTN: Pt 's HTN remains stable.  Denies CP, sob, DOE, headaches, dizziness or visual disturbances.  No complications from the medications currently being used.  Last BP : 122/68  ALLERGIC RHINITIS: Allergic rhinitis remains stable.  Patient denies ongoing symptoms such as runny nose sneezing or tearing. No complications reported from the current medication(s) being used.  DEMENTIA: The dementia remaines stable and continues to function adequately in the current living environment with supervision.  The patient has had little changes in behavior. No complications noted from the medications presently being used.  INSOMNIA: The insomnia remains stable.  No complications noted from the medications presently being used.  Patient denies ongoing insomnia, pain, hallucinations, delusions.  PAST MEDICAL HISTORY:  Past Medical History  Diagnosis Date  . Anxiety   . Depression   . Cataract     had surgery for  . Diabetes mellitus   . Hypertension   . Arthritis     SHOULDER,KNEES,BACK    CURRENT MEDICATIONS: Reviewed per MAR/see medication list  No Known Allergies   REVIEW OF SYSTEMS:  GENERAL: no change in appetite, no fatigue, no weight changes, no fever, chills or weakness RESPIRATORY: no cough, SOB, DOE, wheezing, hemoptysis CARDIAC: no chest pain, or palpitations, +edema GI: no abdominal pain, diarrhea, constipation, heart burn, nausea or vomiting  PHYSICAL EXAMINATION  GENERAL: no acute distress, normal body habitus EYES: conjunctivae normal, sclerae normal, normal eye lids SKIN:  Right eyelid and forehead dry rashes/scabs NECK: supple, trachea midline, no neck masses, no thyroid tenderness, no thyromegaly LYMPHATICS: no LAN in the neck, no supraclavicular LAN RESPIRATORY: breathing is even & unlabored, BS CTAB CARDIAC: RRR, no murmur,no extra heart sounds, BLE edema 2+ GI: abdomen soft, normal BS, no masses, no tenderness, no hepatomegaly, no splenomegaly EXTREMITIES: able to move all 4 extremities PSYCHIATRIC: the patient is alert & oriented to person, affect & behavior appropriate  LABS/RADIOLOGY: 09/17/14  TSH 0.80 total T4 10 0.4  freeT3 2.9  Res uptake T3 42.2 Labs reviewed: Basic Metabolic Panel:  Recent Labs  09/09/14 0703 09/10/14 0421 09/11/14 0525  NA 143 139 137  K 3.4* 4.5 4.1  CL 107 104 103  CO2 26 22 23   GLUCOSE 132* 168* 133*  BUN 11 12 10   CREATININE 0.59 0.57 0.58  CALCIUM 8.8 8.5 8.6   Liver Function Tests:  Recent Labs  09/08/14  1310  AST 43*  ALT 30  ALKPHOS 257*  BILITOT 0.7  PROT 8.3  ALBUMIN 2.6*    CBC:  Recent Labs  09/08/14 1310  09/10/14 0421 09/10/14 1100 09/11/14 0525  WBC 3.8*  < > 1.9* 4.2 8.1  NEUTROABS 2.1  --   --   --    --   HGB 11.2*  < > 17.2* 11.8* 10.5*  HCT 34.2*  < > 52.5* 36.1 31.8*  MCV 87.2  < > 86.6 87.6 84.4  PLT 87*  < > 32* 72* 85*  < > = values in this interval not displayed.  CBG:  Recent Labs  09/11/14 0644 09/11/14 1113 09/11/14 1625  GLUCAP 116* 141* 168*    Dg Chest 2 View  09/08/2014   CLINICAL DATA:  Altered mental status.  EXAM: CHEST  2 VIEW  COMPARISON:  06/16/2013  FINDINGS: Borderline cardiomegaly. Negative aortic and hilar contours. Diffuse interstitial coarsening with cephalized blood flow. No Kerley lines or pleural effusion. No focal opacity confirmed on both views.  IMPRESSION: Bilateral interstitial opacity, favored congestive rather than infectious.   Electronically Signed   By: Jorje Guild M.D.   On: 09/08/2014 16:05   Ct Head Wo Contrast  09/08/2014   CLINICAL DATA:  Insomnia.  Weakness for 3-4 weeks.  Memory problems.  EXAM: CT HEAD WITHOUT CONTRAST  TECHNIQUE: Contiguous axial images were obtained from the base of the skull through the vertex without intravenous contrast.  COMPARISON:  Brain MRI, 05/03/2012 and head CT, 10/05/2011.  FINDINGS: Ventricles are normal in configuration. There is ventricular and sulcal enlargement reflecting mild atrophy. No hydrocephalus.  No parenchymal masses or mass effect. Patchy areas of white matter hypoattenuation are noted consistent with mild chronic microvascular ischemic change.  There is no evidence of a cortical infarct.  There are no extra-axial masses or abnormal fluid collections.  No intracranial hemorrhage.  Visualized sinuses and mastoid air cells are clear. No skull lesion.  IMPRESSION: 1. No acute intracranial abnormalities. 2. Mild atrophy and chronic microvascular ischemic change.   Electronically Signed   By: Lajean Manes M.D.   On: 09/08/2014 16:17   US Abdomen Limited  09/08/2014   CLINICAL DATA:  Hepatic encephalopathy.  EXAM: LIMITED ABDOMEN ULTRASOUND FOR ASCITES  TECHNIQUE: Limited ultrasound survey for  ascites was performed in all four abdominal quadrants.  COMPARISON:  Abdominal ultrasound 08/04/2014.  FINDINGS: No ascites is identified.  IMPRESSION: Negative for ascites.   Electronically Signed   By: Inge Rise M.D.   On: 09/08/2014 23:29    ASSESSMENT/PLAN:  Physical deconditioning - for home health PT, OT, nursing and speech therapy Hepatic encephalopathy - improving;  continue lactulose 30 mg/45 ML by mouth daily Diabetes mellitus, type II - well controlled; recently started on metformin 500 mg by mouth twice a day Hypertension - well controlled; continue amlodipine 10 milligrams daily, lisinopril 10 mg daily and Coreg 3.125 mg by mouth twice a day Hepatitis C -continue treatment with Harvoni Anxiety - stable; continue Paxil 20 mg daily Depression - stable; continue Abilify 10 mg by mouth daily Dementia - stable; no medication Diastolic CHF - stable; continue Coreg 3.125 mg twice a day and Lasix 40 mg daily Allergic rhinitis - stable; continue Singulair 10 mg by mouth daily Overactive bladder - continue Ditropan 5 mg by mouth twice a day Insomnia - continue Desyrel 25 mg 1 tab by mouth daily at bedtime Abnormal thyroid level -  repeat TSH shows 0.80-normal Shingles -  recently finished Valtrex treatment; continue erythromycin ointment to left eyelid and forehead scabs; patient was seen by ophthalmologist    I have filled out patient's discharge paperwork and written prescriptions.  Patient will receive home health PT, OT, ST and Nursing.   Total discharge time: Greater than 30 minutes  Discharge time involved coordination of the discharge process with social worker, nursing staff and therapy department. Medical justification for home health services verified.    [facility], NP [employer] [phone]

## 2014-10-03 DIAGNOSIS — I10 Essential (primary) hypertension: Secondary | ICD-10-CM | POA: Diagnosis not present

## 2014-10-03 DIAGNOSIS — R260 Ataxic gait: Secondary | ICD-10-CM | POA: Diagnosis not present

## 2014-10-03 DIAGNOSIS — B182 Chronic viral hepatitis C: Secondary | ICD-10-CM | POA: Diagnosis not present

## 2014-10-03 DIAGNOSIS — F0390 Unspecified dementia without behavioral disturbance: Secondary | ICD-10-CM | POA: Diagnosis not present

## 2014-10-03 DIAGNOSIS — I509 Heart failure, unspecified: Secondary | ICD-10-CM | POA: Diagnosis not present

## 2014-10-03 DIAGNOSIS — E119 Type 2 diabetes mellitus without complications: Secondary | ICD-10-CM | POA: Diagnosis not present

## 2014-10-03 DIAGNOSIS — Z7982 Long term (current) use of aspirin: Secondary | ICD-10-CM | POA: Diagnosis not present

## 2014-10-03 DIAGNOSIS — F419 Anxiety disorder, unspecified: Secondary | ICD-10-CM | POA: Diagnosis not present

## 2014-10-03 DIAGNOSIS — K7460 Unspecified cirrhosis of liver: Secondary | ICD-10-CM | POA: Diagnosis not present

## 2014-10-05 DIAGNOSIS — B182 Chronic viral hepatitis C: Secondary | ICD-10-CM | POA: Diagnosis not present

## 2014-10-05 DIAGNOSIS — F0390 Unspecified dementia without behavioral disturbance: Secondary | ICD-10-CM | POA: Diagnosis not present

## 2014-10-05 DIAGNOSIS — I509 Heart failure, unspecified: Secondary | ICD-10-CM | POA: Diagnosis not present

## 2014-10-05 DIAGNOSIS — K7460 Unspecified cirrhosis of liver: Secondary | ICD-10-CM | POA: Diagnosis not present

## 2014-10-05 DIAGNOSIS — I10 Essential (primary) hypertension: Secondary | ICD-10-CM | POA: Diagnosis not present

## 2014-10-05 DIAGNOSIS — E119 Type 2 diabetes mellitus without complications: Secondary | ICD-10-CM | POA: Diagnosis not present

## 2014-10-07 DIAGNOSIS — I10 Essential (primary) hypertension: Secondary | ICD-10-CM | POA: Diagnosis not present

## 2014-10-07 DIAGNOSIS — B182 Chronic viral hepatitis C: Secondary | ICD-10-CM | POA: Diagnosis not present

## 2014-10-07 DIAGNOSIS — E119 Type 2 diabetes mellitus without complications: Secondary | ICD-10-CM | POA: Diagnosis not present

## 2014-10-07 DIAGNOSIS — K7460 Unspecified cirrhosis of liver: Secondary | ICD-10-CM | POA: Diagnosis not present

## 2014-10-07 DIAGNOSIS — I509 Heart failure, unspecified: Secondary | ICD-10-CM | POA: Diagnosis not present

## 2014-10-07 DIAGNOSIS — F0390 Unspecified dementia without behavioral disturbance: Secondary | ICD-10-CM | POA: Diagnosis not present

## 2014-10-08 DIAGNOSIS — B182 Chronic viral hepatitis C: Secondary | ICD-10-CM | POA: Diagnosis not present

## 2014-10-09 DIAGNOSIS — B182 Chronic viral hepatitis C: Secondary | ICD-10-CM | POA: Diagnosis not present

## 2014-10-09 DIAGNOSIS — I10 Essential (primary) hypertension: Secondary | ICD-10-CM | POA: Diagnosis not present

## 2014-10-09 DIAGNOSIS — I509 Heart failure, unspecified: Secondary | ICD-10-CM | POA: Diagnosis not present

## 2014-10-09 DIAGNOSIS — K7460 Unspecified cirrhosis of liver: Secondary | ICD-10-CM | POA: Diagnosis not present

## 2014-10-09 DIAGNOSIS — F0390 Unspecified dementia without behavioral disturbance: Secondary | ICD-10-CM | POA: Diagnosis not present

## 2014-10-09 DIAGNOSIS — E119 Type 2 diabetes mellitus without complications: Secondary | ICD-10-CM | POA: Diagnosis not present

## 2014-10-12 DIAGNOSIS — I10 Essential (primary) hypertension: Secondary | ICD-10-CM | POA: Diagnosis not present

## 2014-10-12 DIAGNOSIS — F0390 Unspecified dementia without behavioral disturbance: Secondary | ICD-10-CM | POA: Diagnosis not present

## 2014-10-12 DIAGNOSIS — E119 Type 2 diabetes mellitus without complications: Secondary | ICD-10-CM | POA: Diagnosis not present

## 2014-10-12 DIAGNOSIS — K7460 Unspecified cirrhosis of liver: Secondary | ICD-10-CM | POA: Diagnosis not present

## 2014-10-12 DIAGNOSIS — I509 Heart failure, unspecified: Secondary | ICD-10-CM | POA: Diagnosis not present

## 2014-10-12 DIAGNOSIS — B182 Chronic viral hepatitis C: Secondary | ICD-10-CM | POA: Diagnosis not present

## 2014-10-13 DIAGNOSIS — K7460 Unspecified cirrhosis of liver: Secondary | ICD-10-CM | POA: Diagnosis not present

## 2014-10-13 DIAGNOSIS — I509 Heart failure, unspecified: Secondary | ICD-10-CM | POA: Diagnosis not present

## 2014-10-13 DIAGNOSIS — I10 Essential (primary) hypertension: Secondary | ICD-10-CM | POA: Diagnosis not present

## 2014-10-13 DIAGNOSIS — E119 Type 2 diabetes mellitus without complications: Secondary | ICD-10-CM | POA: Diagnosis not present

## 2014-10-13 DIAGNOSIS — B182 Chronic viral hepatitis C: Secondary | ICD-10-CM | POA: Diagnosis not present

## 2014-10-13 DIAGNOSIS — F0390 Unspecified dementia without behavioral disturbance: Secondary | ICD-10-CM | POA: Diagnosis not present

## 2014-10-14 DIAGNOSIS — I509 Heart failure, unspecified: Secondary | ICD-10-CM | POA: Diagnosis not present

## 2014-10-14 DIAGNOSIS — E119 Type 2 diabetes mellitus without complications: Secondary | ICD-10-CM | POA: Diagnosis not present

## 2014-10-14 DIAGNOSIS — B182 Chronic viral hepatitis C: Secondary | ICD-10-CM | POA: Diagnosis not present

## 2014-10-14 DIAGNOSIS — I10 Essential (primary) hypertension: Secondary | ICD-10-CM | POA: Diagnosis not present

## 2014-10-14 DIAGNOSIS — F0390 Unspecified dementia without behavioral disturbance: Secondary | ICD-10-CM | POA: Diagnosis not present

## 2014-10-14 DIAGNOSIS — K7460 Unspecified cirrhosis of liver: Secondary | ICD-10-CM | POA: Diagnosis not present

## 2014-10-16 DIAGNOSIS — I509 Heart failure, unspecified: Secondary | ICD-10-CM | POA: Diagnosis not present

## 2014-10-16 DIAGNOSIS — I10 Essential (primary) hypertension: Secondary | ICD-10-CM | POA: Diagnosis not present

## 2014-10-16 DIAGNOSIS — K7460 Unspecified cirrhosis of liver: Secondary | ICD-10-CM | POA: Diagnosis not present

## 2014-10-16 DIAGNOSIS — E119 Type 2 diabetes mellitus without complications: Secondary | ICD-10-CM | POA: Diagnosis not present

## 2014-10-16 DIAGNOSIS — B182 Chronic viral hepatitis C: Secondary | ICD-10-CM | POA: Diagnosis not present

## 2014-10-16 DIAGNOSIS — F0390 Unspecified dementia without behavioral disturbance: Secondary | ICD-10-CM | POA: Diagnosis not present

## 2014-10-19 DIAGNOSIS — I509 Heart failure, unspecified: Secondary | ICD-10-CM | POA: Diagnosis not present

## 2014-10-19 DIAGNOSIS — E119 Type 2 diabetes mellitus without complications: Secondary | ICD-10-CM | POA: Diagnosis not present

## 2014-10-19 DIAGNOSIS — F0390 Unspecified dementia without behavioral disturbance: Secondary | ICD-10-CM | POA: Diagnosis not present

## 2014-10-19 DIAGNOSIS — I10 Essential (primary) hypertension: Secondary | ICD-10-CM | POA: Diagnosis not present

## 2014-10-19 DIAGNOSIS — K7460 Unspecified cirrhosis of liver: Secondary | ICD-10-CM | POA: Diagnosis not present

## 2014-10-19 DIAGNOSIS — B182 Chronic viral hepatitis C: Secondary | ICD-10-CM | POA: Diagnosis not present

## 2014-10-21 DIAGNOSIS — E119 Type 2 diabetes mellitus without complications: Secondary | ICD-10-CM | POA: Diagnosis not present

## 2014-10-21 DIAGNOSIS — F0390 Unspecified dementia without behavioral disturbance: Secondary | ICD-10-CM | POA: Diagnosis not present

## 2014-10-21 DIAGNOSIS — K7460 Unspecified cirrhosis of liver: Secondary | ICD-10-CM | POA: Diagnosis not present

## 2014-10-21 DIAGNOSIS — B182 Chronic viral hepatitis C: Secondary | ICD-10-CM | POA: Diagnosis not present

## 2014-10-21 DIAGNOSIS — I10 Essential (primary) hypertension: Secondary | ICD-10-CM | POA: Diagnosis not present

## 2014-10-21 DIAGNOSIS — I509 Heart failure, unspecified: Secondary | ICD-10-CM | POA: Diagnosis not present

## 2014-10-26 DIAGNOSIS — F0390 Unspecified dementia without behavioral disturbance: Secondary | ICD-10-CM | POA: Diagnosis not present

## 2014-10-26 DIAGNOSIS — I509 Heart failure, unspecified: Secondary | ICD-10-CM | POA: Diagnosis not present

## 2014-10-26 DIAGNOSIS — K7460 Unspecified cirrhosis of liver: Secondary | ICD-10-CM | POA: Diagnosis not present

## 2014-10-26 DIAGNOSIS — I10 Essential (primary) hypertension: Secondary | ICD-10-CM | POA: Diagnosis not present

## 2014-10-26 DIAGNOSIS — E119 Type 2 diabetes mellitus without complications: Secondary | ICD-10-CM | POA: Diagnosis not present

## 2014-10-26 DIAGNOSIS — B182 Chronic viral hepatitis C: Secondary | ICD-10-CM | POA: Diagnosis not present

## 2014-10-28 DIAGNOSIS — E119 Type 2 diabetes mellitus without complications: Secondary | ICD-10-CM | POA: Diagnosis not present

## 2014-10-28 DIAGNOSIS — B182 Chronic viral hepatitis C: Secondary | ICD-10-CM | POA: Diagnosis not present

## 2014-10-28 DIAGNOSIS — I509 Heart failure, unspecified: Secondary | ICD-10-CM | POA: Diagnosis not present

## 2014-10-28 DIAGNOSIS — K7460 Unspecified cirrhosis of liver: Secondary | ICD-10-CM | POA: Diagnosis not present

## 2014-10-28 DIAGNOSIS — I10 Essential (primary) hypertension: Secondary | ICD-10-CM | POA: Diagnosis not present

## 2014-10-28 DIAGNOSIS — F0390 Unspecified dementia without behavioral disturbance: Secondary | ICD-10-CM | POA: Diagnosis not present

## 2014-11-01 DIAGNOSIS — I10 Essential (primary) hypertension: Secondary | ICD-10-CM | POA: Diagnosis not present

## 2014-11-01 DIAGNOSIS — E119 Type 2 diabetes mellitus without complications: Secondary | ICD-10-CM | POA: Diagnosis not present

## 2014-11-01 DIAGNOSIS — I509 Heart failure, unspecified: Secondary | ICD-10-CM | POA: Diagnosis not present

## 2014-11-01 DIAGNOSIS — F0390 Unspecified dementia without behavioral disturbance: Secondary | ICD-10-CM | POA: Diagnosis not present

## 2014-11-01 DIAGNOSIS — K7460 Unspecified cirrhosis of liver: Secondary | ICD-10-CM | POA: Diagnosis not present

## 2014-11-01 DIAGNOSIS — B182 Chronic viral hepatitis C: Secondary | ICD-10-CM | POA: Diagnosis not present

## 2014-11-04 DIAGNOSIS — F3181 Bipolar II disorder: Secondary | ICD-10-CM | POA: Diagnosis not present

## 2014-11-06 ENCOUNTER — Other Ambulatory Visit: Payer: Self-pay | Admitting: Adult Health

## 2014-11-07 DIAGNOSIS — B182 Chronic viral hepatitis C: Secondary | ICD-10-CM | POA: Diagnosis not present

## 2014-11-07 DIAGNOSIS — I10 Essential (primary) hypertension: Secondary | ICD-10-CM | POA: Diagnosis not present

## 2014-11-07 DIAGNOSIS — K7460 Unspecified cirrhosis of liver: Secondary | ICD-10-CM | POA: Diagnosis not present

## 2014-11-07 DIAGNOSIS — F0390 Unspecified dementia without behavioral disturbance: Secondary | ICD-10-CM | POA: Diagnosis not present

## 2014-11-07 DIAGNOSIS — I509 Heart failure, unspecified: Secondary | ICD-10-CM | POA: Diagnosis not present

## 2014-11-07 DIAGNOSIS — E119 Type 2 diabetes mellitus without complications: Secondary | ICD-10-CM | POA: Diagnosis not present

## 2014-11-10 DIAGNOSIS — I10 Essential (primary) hypertension: Secondary | ICD-10-CM | POA: Diagnosis not present

## 2014-11-10 DIAGNOSIS — K7460 Unspecified cirrhosis of liver: Secondary | ICD-10-CM | POA: Diagnosis not present

## 2014-11-10 DIAGNOSIS — B182 Chronic viral hepatitis C: Secondary | ICD-10-CM | POA: Diagnosis not present

## 2014-11-10 DIAGNOSIS — I509 Heart failure, unspecified: Secondary | ICD-10-CM | POA: Diagnosis not present

## 2014-11-10 DIAGNOSIS — F0390 Unspecified dementia without behavioral disturbance: Secondary | ICD-10-CM | POA: Diagnosis not present

## 2014-11-10 DIAGNOSIS — E119 Type 2 diabetes mellitus without complications: Secondary | ICD-10-CM | POA: Diagnosis not present

## 2014-11-13 ENCOUNTER — Telehealth: Payer: Self-pay | Admitting: Medical

## 2014-11-13 NOTE — Telephone Encounter (Signed)
New pt records received. Sending back to [place] for review then to [place].

## 2014-11-18 ENCOUNTER — Ambulatory Visit (INDEPENDENT_AMBULATORY_CARE_PROVIDER_SITE_OTHER): Payer: Medicare Other | Admitting: Medical

## 2014-11-18 ENCOUNTER — Telehealth: Payer: Self-pay | Admitting: Medical

## 2014-11-18 ENCOUNTER — Encounter: Payer: Self-pay | Admitting: Medical

## 2014-11-18 VITALS — BP 128/70 | HR 68 | Temp 97.7°F | Resp 14 | Ht 59.4 in | Wt 166.0 lb

## 2014-11-18 DIAGNOSIS — F319 Bipolar disorder, unspecified: Secondary | ICD-10-CM | POA: Insufficient documentation

## 2014-11-18 DIAGNOSIS — F3170 Bipolar disorder, currently in remission, most recent episode unspecified: Secondary | ICD-10-CM

## 2014-11-18 DIAGNOSIS — N3946 Mixed incontinence: Secondary | ICD-10-CM

## 2014-11-18 DIAGNOSIS — I10 Essential (primary) hypertension: Secondary | ICD-10-CM

## 2014-11-18 DIAGNOSIS — E119 Type 2 diabetes mellitus without complications: Secondary | ICD-10-CM | POA: Diagnosis not present

## 2014-11-18 DIAGNOSIS — K7682 Hepatic encephalopathy: Secondary | ICD-10-CM

## 2014-11-18 DIAGNOSIS — F329 Major depressive disorder, single episode, unspecified: Secondary | ICD-10-CM

## 2014-11-18 DIAGNOSIS — F32A Depression, unspecified: Secondary | ICD-10-CM

## 2014-11-18 DIAGNOSIS — G8929 Other chronic pain: Secondary | ICD-10-CM | POA: Insufficient documentation

## 2014-11-18 DIAGNOSIS — K7290 Hepatic failure, unspecified without coma: Secondary | ICD-10-CM | POA: Diagnosis not present

## 2014-11-18 DIAGNOSIS — F1911 Other psychoactive substance abuse, in remission: Secondary | ICD-10-CM

## 2014-11-18 DIAGNOSIS — Z87891 Personal history of nicotine dependence: Secondary | ICD-10-CM

## 2014-11-18 DIAGNOSIS — Z0000 Encounter for general adult medical examination without abnormal findings: Secondary | ICD-10-CM

## 2014-11-18 DIAGNOSIS — M549 Dorsalgia, unspecified: Secondary | ICD-10-CM

## 2014-11-18 DIAGNOSIS — F411 Generalized anxiety disorder: Secondary | ICD-10-CM

## 2014-11-18 DIAGNOSIS — Z8679 Personal history of other diseases of the circulatory system: Secondary | ICD-10-CM

## 2014-11-18 DIAGNOSIS — B182 Chronic viral hepatitis C: Secondary | ICD-10-CM

## 2014-11-18 DIAGNOSIS — D696 Thrombocytopenia, unspecified: Secondary | ICD-10-CM | POA: Diagnosis not present

## 2014-11-18 DIAGNOSIS — Z87898 Personal history of other specified conditions: Secondary | ICD-10-CM | POA: Diagnosis not present

## 2014-11-18 DIAGNOSIS — F0391 Unspecified dementia with behavioral disturbance: Secondary | ICD-10-CM

## 2014-11-18 DIAGNOSIS — H409 Unspecified glaucoma: Secondary | ICD-10-CM

## 2014-11-18 DIAGNOSIS — G4700 Insomnia, unspecified: Secondary | ICD-10-CM

## 2014-11-18 LAB — HEPATIC FUNCTION PANEL
ALT: 25 U/L (ref 0–35)
AST: 44 U/L — ABNORMAL HIGH (ref 0–37)
Albumin: 3.2 g/dL — ABNORMAL LOW (ref 3.5–5.2)
Alkaline Phosphatase: 182 U/L — ABNORMAL HIGH (ref 39–117)
Bilirubin, Direct: 0.1 mg/dL (ref 0.0–0.3)
Indirect Bilirubin: 0.5 mg/dL (ref 0.2–1.2)
Total Bilirubin: 0.6 mg/dL (ref 0.2–1.2)
Total Protein: 8.7 g/dL — ABNORMAL HIGH (ref 6.0–8.3)

## 2014-11-18 LAB — BASIC METABOLIC PANEL
BUN: 21 mg/dL (ref 6–23)
CO2: 27 mEq/L (ref 19–32)
Calcium: 9.8 mg/dL (ref 8.4–10.5)
Chloride: 99 mEq/L (ref 96–112)
Creat: 0.64 mg/dL (ref 0.50–1.10)
Glucose, Bld: 315 mg/dL — ABNORMAL HIGH (ref 70–99)
Potassium: 4.2 mEq/L (ref 3.5–5.3)
Sodium: 133 mEq/L — ABNORMAL LOW (ref 135–145)

## 2014-11-18 MED ORDER — LACTULOSE 10 GM/15ML PO SOLN: 30.0000 g | Freq: Every day | ORAL | Status: AC | PRN

## 2014-11-18 MED ORDER — TRAZODONE HCL 50 MG PO TABS: 50.0000 mg | ORAL_TABLET | Freq: Every day | ORAL | Status: DC

## 2014-11-18 MED ORDER — ARIPIPRAZOLE 10 MG PO TABS: 10.0000 mg | ORAL_TABLET | Freq: Every day | ORAL | Status: DC

## 2014-11-18 MED ORDER — CARVEDILOL 3.125 MG PO TABS: 3.1250 mg | ORAL_TABLET | Freq: Two times a day (BID) | ORAL | Status: DC

## 2014-11-18 MED ORDER — LISINOPRIL 10 MG PO TABS: 10.0000 mg | ORAL_TABLET | Freq: Every day | ORAL | Status: DC

## 2014-11-18 MED ORDER — LATANOPROST 0.005 % OP SOLN: 1.0000 [drp] | Freq: Every day | OPHTHALMIC | Status: DC

## 2014-11-18 MED ORDER — OXYBUTYNIN CHLORIDE 5 MG PO TABS: 5.0000 mg | ORAL_TABLET | Freq: Two times a day (BID) | ORAL | Status: DC

## 2014-11-18 MED ORDER — PAROXETINE HCL 20 MG PO TABS: 20.0000 mg | ORAL_TABLET | Freq: Every day | ORAL | Status: DC

## 2014-11-18 MED ORDER — METFORMIN HCL 500 MG PO TABS: 500.0000 mg | ORAL_TABLET | Freq: Two times a day (BID) | ORAL | Status: DC

## 2014-11-18 MED ORDER — MONTELUKAST SODIUM 10 MG PO TABS: 10.0000 mg | ORAL_TABLET | Freq: Every day | ORAL | Status: DC

## 2014-11-18 MED ORDER — FUROSEMIDE 40 MG PO TABS: 40.0000 mg | ORAL_TABLET | Freq: Two times a day (BID) | ORAL | Status: DC

## 2014-11-18 MED ORDER — AMLODIPINE BESYLATE 10 MG PO TABS: 10.0000 mg | ORAL_TABLET | Freq: Every day | ORAL | Status: DC

## 2014-11-18 NOTE — Progress Notes (Signed)
Subjective:    Carol Rubio is a 69 y.o. female who presents for Medicare Annual Wellness Visit and routine chronic disease f/u as well as f/u from recent hospitalization in 08/2014.  Her son Carol Rubio (who is a patient of mine) brings her in today.  He manages her finances and medications, but she handles her other ADLs.  She doesn't drive.  Names of Other Physician/Practitioners you currently use: TYSINGER, DAVID Audelia Acton, PA-C here for primary care, establishing today. [facility] [place] Was seeing [facility] prior and [facility] No eye doctor visit in >1 year No dentist visit in > 1 year  Medical Services you may have received from other than [facility] providers in the past year (date may be approximate) Hospitalization 08/2014 for encephalopathy, hx/o Hep C, was on Harvani x 88mo prior to this hospitalization.  Went to nursing home thereafter.  Living back at home with elder sister.  Preventative care: None in recent year  Prior vaccinations: Thinks she had pertussis, flu and pneumonia updated 2015.  History reviewed: allergies, current medications, past family history, past medical history, past social history, past surgical history and problem list  Current Problems (verified) Patient Active Problem List   Diagnosis Date Noted  . Thrombocytopenia 11/18/2014  . History of substance abuse 11/18/2014  . Former smoker 11/18/2014  . Chronic back pain 11/18/2014  . Glaucoma 11/18/2014  . Mixed incontinence 11/18/2014  . Anxiety state 11/18/2014  . Bipolar affective disorder 11/18/2014  . History of CHF (congestive heart failure) 11/18/2014  . Encephalopathy, hepatic 11/18/2014  . Diabetes type 2, controlled 11/18/2014  . Diabetes mellitus 09/16/2014  . Anxiety 09/16/2014  . Depression 09/16/2014  . Dementia 09/16/2014  . Diastolic CHF 78/58/8502  . Allergic rhinitis 09/16/2014  . Overactive bladder 09/16/2014  . Insomnia  09/16/2014  . Hepatic encephalopathy   . Chronic hepatitis C without hepatic coma   . Essential hypertension   . Weakness 09/08/2014  . Acute encephalopathy 09/08/2014    Immunization History  Administered Date(s) Administered  . Pneumococcal Polysaccharide-23 09/10/2014    Risk Factors:  Tobacco History  Smoking status  . Former Smoker -- 1.00 packs/day  Smokeless tobacco  . Never Used    Comment: 43 pack year history  quit many years ago, 20 years ago?  Alcohol Current alcohol use: none.  Has hx/o alcohol abuse prior to age 78 though  Hx/o drug abuse prior to age 61 though  Caffeine Current caffeine use: limited  Exercise Current exercise habits: limited  Nutrition/Diet Current diet: in general, a "healthy" diet    Cardiac risk factors: advanced age (older than 72 for men, 40 for women), diabetes mellitus, hypertension and sedentary lifestyle.  Depression Screen Nurse depression screen reviewed.  Activities of Daily Living Nurse ADLs screen reviewed.  Vision Difficulties: Yes  Hearing Difficulties: Yes  Cognition  Do you feel that you have a problem with memory? Yes  Do you often misplace items? Yes  Do you feel safe at home?  Yes  Advanced directives Does patient have a [place]? Yes/maybe? Does patient have a Living Will? Yes/maybe?  Screening Tests Health Maintenance  Topic Date Due  . FOOT EXAM  02/11/1954  . OPHTHALMOLOGY EXAM  02/11/1954  . TETANUS/TDAP  02/12/1963  . ZOSTAVAX  02/12/2004  . URINE MICROALBUMIN  12/29/2011  . INFLUENZA VACCINE  05/30/2014  . MAMMOGRAM  01/31/2015  . HEMOGLOBIN A1C  03/10/2015  . COLONOSCOPY  02/08/2021  .  DEXA SCAN  Completed  . PNEUMOCOCCAL POLYSACCHARIDE VACCINE AGE 54 AND OVER  Completed   concerns 1- med refills 2- does she need any other referrals 3 - she still wants to drive, son doesn't think she is safe to drive.   There have been times she has driven somewhere, and fell  asleep in the parking lot 4- she ran out of some of the medications, not sure if she is suppose to continue those  Objective:     Vision and hearing screens reviewed.   BP 128/70 mmHg  Pulse 68  Temp(Src) 97.7 F (36.5 C) (Oral)  Resp 14  Ht 4' 11.4" (1.509 m)  Wt 166 lb (75.297 kg)  BMI 33.07 kg/m2  General appearance: alert, no distress, WD/WN, AA female Cognitive Testing  See separate MMSE HEENT: normocephalic, sclerae anicteric, TMs pearly, nares patent, no discharge or erythema, pharynx normal Oral cavity: MMM, no lesions Neck: supple, no lymphadenopathy, no thyromegaly, no masses Heart: RRR, normal S1, S2, no murmurs Lungs: CTA bilaterally, no wheezes, rhonchi, or rales Abdomen: +bs, soft, RUQ surgical scar, otherwise non tender, non distended, no masses, no hepatomegaly, no splenomegaly Musculoskeletal: nontender, no swelling, no obvious deformity Extremities: 1+ LE nonpitting edema, no cyanosis, no clubbing Pulses: 1+ symmetric, upper and lower extremities, normal cap refill Neurological: alert, CN2-12 intact, strength normal upper extremities and lower extremities, sensation normal throughout, DTRs 2+ throughout, no cerebellar signs, gait normal Psychiatric: normal affect, behavior normal, pleasant  Breast/gyn/rectal - deferred today  MMSE score 24/30 today   Assessment:   Encounter Diagnoses  Name Primary?  . Encounter for health maintenance examination in adult Yes  . Diabetes type 2, controlled   . Chronic hepatitis C without hepatic coma   . Essential hypertension   . History of CHF (congestive heart failure)   . Encephalopathy, hepatic   . Depression   . Bipolar disorder in partial remission, most recent episode unspecified type   . Anxiety state   . Dementia, with behavioral disturbance   . Thrombocytopenia   . History of substance abuse   . Former smoker   . Chronic back pain   . Glaucoma   . Mixed incontinence   . Insomnia     Plan:    During the course of the visit the patient was educated and counseled about appropriate screening and preventive services including:    Pneumococcal vaccine   Influenza vaccine  Td vaccine  Diabetes screening  Glaucoma screening  Advanced directives: has an advanced directive - a copy HAS NOT been provided.   Conditions/risks identified: Reviewed recent d/c summary from 09/08/14- 1//13/15 hospitalization fo rencphalopathy.    Despite trying 3 attempts today, unable to give blood for phlebotomy.  She will return later this week for restick.  Diabetes type 2 - hgbA1C in 08/2014, 6.6%, c/t current medication  Hypertension- c/t current medication  History of CHF-continue Lasix, check weights and if >5lb gain, let us know  History of encephalopathy due to hepatitis C - reviewed 08/2014 hospitalization D/C summary.    Thrombocytopenia- presumed due to chronic hep C  Depression, bipolar, anxiety, managed by [facility] psychiatry  Dementia-reviewed MMSE today.  discussed their concerns.  Consider neurology consult  Glaucoma - on drops, needs ophthalmology f/u.    Chronic back pain - uses Hydrocodone occasionally  Urinary incontinence - on oxybutynin, but may need to stop this given glaucoma  Screening recommendations, referrals: Completed MMSE, hearing and vision screens today Completed ADL, depression, and fall risks  screens today  Vaccinations: Up to date per patient from 2015 regarding Tdap, influenza, pneumococcal, has hx/o shingles infection 2015  Recommended yearly ophthalmology/optometry visit for glaucoma screening and checkup Recommended yearly dental visit for hygiene and checkup Advanced directives - we discussed living will, health care POA, general Will and POA  Medicare Attestation I have personally reviewed: The patient's medical and social history Their use of alcohol, tobacco or illicit drugs Their current medications and supplements The patient's  functional ability including ADLs,fall risks, home safety risks, cognitive, and hearing and visual impairment Diet and physical activities Evidence for depression or mood disorders  The patient's weight, height, BMI, and visual acuity have been recorded in the chart.  I have made referrals, counseling, and provided education to the patient based on review of the above and I have provided the patient with a written personalized care plan for preventive services.     Crisoforo Oxford, PA-C   11/18/2014

## 2014-11-18 NOTE — Telephone Encounter (Signed)
1- refer to neurology for dementia, concern about ability to drive and whether to consider Namenda or similar medication, recent hospitalization for encephalopathy 2- refer to eye doctor for routine f/u, glaucoma, and whether she should c/t Oxybutynin or not for incontinence given risks with glaucoma 3 - f/u 36mo

## 2014-11-19 NOTE — Telephone Encounter (Signed)
Faxed referral to [facility], scheduled appointment with Eye Dr. Dr. Delman Cheadle 11/24/14 @ 10:00 am, made patient a follow up here with Audelia Acton in 1 month.

## 2014-11-25 ENCOUNTER — Other Ambulatory Visit: Payer: Medicare Other

## 2014-11-25 DIAGNOSIS — Z0000 Encounter for general adult medical examination without abnormal findings: Secondary | ICD-10-CM | POA: Diagnosis not present

## 2014-11-25 DIAGNOSIS — E119 Type 2 diabetes mellitus without complications: Secondary | ICD-10-CM | POA: Diagnosis not present

## 2014-11-25 DIAGNOSIS — K7290 Hepatic failure, unspecified without coma: Secondary | ICD-10-CM | POA: Diagnosis not present

## 2014-11-25 DIAGNOSIS — B182 Chronic viral hepatitis C: Secondary | ICD-10-CM | POA: Diagnosis not present

## 2014-11-25 DIAGNOSIS — K7682 Hepatic encephalopathy: Secondary | ICD-10-CM

## 2014-11-25 LAB — CBC WITH DIFFERENTIAL/PLATELET
Basophils Absolute: 0 10*3/uL (ref 0.0–0.1)
Basophils Relative: 0 % (ref 0–1)
Eosinophils Absolute: 0.1 10*3/uL (ref 0.0–0.7)
Eosinophils Relative: 3 % (ref 0–5)
HCT: 33.4 % — ABNORMAL LOW (ref 36.0–46.0)
Hemoglobin: 11.4 g/dL — ABNORMAL LOW (ref 12.0–15.0)
Lymphocytes Relative: 38 % (ref 12–46)
Lymphs Abs: 1.9 10*3/uL (ref 0.7–4.0)
MCH: 27.9 pg (ref 26.0–34.0)
MCHC: 34.1 g/dL (ref 30.0–36.0)
MCV: 81.7 fL (ref 78.0–100.0)
MPV: 9.7 fL (ref 8.6–12.4)
Monocytes Absolute: 0.4 10*3/uL (ref 0.1–1.0)
Monocytes Relative: 9 % (ref 3–12)
Neutro Abs: 2.5 10*3/uL (ref 1.7–7.7)
Neutrophils Relative %: 50 % (ref 43–77)
Platelets: 128 10*3/uL — ABNORMAL LOW (ref 150–400)
RBC: 4.09 MIL/uL (ref 3.87–5.11)
RDW: 15.1 % (ref 11.5–15.5)
WBC: 4.9 10*3/uL (ref 4.0–10.5)

## 2014-11-25 LAB — MICROALBUMIN / CREATININE URINE RATIO
Creatinine, Urine: 116.4 mg/dL
Microalb Creat Ratio: 18.9 mg/g (ref 0.0–30.0)
Microalb, Ur: 2.2 mg/dL — ABNORMAL HIGH (ref <2.0)

## 2014-11-25 LAB — HEMOGLOBIN A1C
Hgb A1c MFr Bld: 7 % — ABNORMAL HIGH (ref <5.7)
Mean Plasma Glucose: 154 mg/dL — ABNORMAL HIGH (ref <117)

## 2014-11-26 LAB — AMMONIA

## 2014-11-27 DIAGNOSIS — E119 Type 2 diabetes mellitus without complications: Secondary | ICD-10-CM | POA: Diagnosis not present

## 2014-11-27 LAB — HM DIABETES EYE EXAM

## 2014-12-01 ENCOUNTER — Institutional Professional Consult (permissible substitution): Payer: Medicare Other | Admitting: Neurology

## 2014-12-01 ENCOUNTER — Encounter: Payer: Self-pay | Admitting: Neurology

## 2014-12-01 ENCOUNTER — Ambulatory Visit (INDEPENDENT_AMBULATORY_CARE_PROVIDER_SITE_OTHER): Payer: Medicare Other | Admitting: Neurology

## 2014-12-01 VITALS — BP 128/70 | HR 166 | Ht 64.0 in | Wt 161.0 lb

## 2014-12-01 DIAGNOSIS — G9340 Encephalopathy, unspecified: Secondary | ICD-10-CM | POA: Diagnosis not present

## 2014-12-01 DIAGNOSIS — B182 Chronic viral hepatitis C: Secondary | ICD-10-CM | POA: Diagnosis not present

## 2014-12-01 DIAGNOSIS — K7290 Hepatic failure, unspecified without coma: Secondary | ICD-10-CM | POA: Diagnosis not present

## 2014-12-01 DIAGNOSIS — F3170 Bipolar disorder, currently in remission, most recent episode unspecified: Secondary | ICD-10-CM | POA: Diagnosis not present

## 2014-12-01 DIAGNOSIS — K7682 Hepatic encephalopathy: Secondary | ICD-10-CM

## 2014-12-01 NOTE — Progress Notes (Signed)
PATIENT: Carol Rubio DOB: [birth year]  HISTORICAL  Carol Rubio is a 69 years old right-handed African-American female, referred by her primary care physician, Carol Rubio, Carol Rubio, accompanied by her brother Carol Rubio at today's visit.  She is referred for evaluation of confusion, she had a history of hepatitis C, had hepatic encephalopathy presented with confusion, in November 2015, was admitted to the hospital, his ammonia level 103, her  mentation has much improved, after lactulose treatment, in addition, she has completed her 12 weeks course of hepatitis C treatment, virus level was nondetectable.  She used to have history of alcohol abuse, IV drug use, depression, family history of dementia, 3 of her siblings, father suffered depression,  She used to be highly functional, had rapid decline of functional status over past 6 months, since fall of 2015, overall has much improved since her treatment of hepatitis C, but not back to her baseline yet, she is not driving, forget to take her medications, lives with her elderly sister, brother checks on her daily basis.  I have reviewed CAT scan in July 2015,MRI of the brain in 2013, mild to moderate atrophy, periventricular white matter disease.  Laboratory, mild anemia, hemoglobin 10.5, elevated alkaline phosphate, AST, ALT, elevated glucose, ammonia 103 in November 2992, normal E26, folic acid, low TSH, 0.3  REVIEW OF SYSTEMS: Full 14 system review of systems performed and notable only for insomnia, nausea,  ALLERGIES: No Known Allergies  HOME MEDICATIONS: Current Outpatient Prescriptions  Medication Sig Dispense Refill  . amLODipine (NORVASC) 10 MG tablet Take 1 tablet (10 mg total) by mouth daily. 30 tablet 2  . ARIPiprazole (ABILIFY) 10 MG tablet Take 1 tablet (10 mg total) by mouth daily. 30 tablet 1  . carvedilol (COREG) 3.125 MG tablet Take 1 tablet (3.125 mg total) by mouth 2 (two) times daily with a meal. 60 tablet 2  . furosemide  (LASIX) 40 MG tablet Take 1 tablet (40 mg total) by mouth 2 (two) times daily. 30 tablet 1  . HYDROcodone-acetaminophen (NORCO/VICODIN) 5-325 MG per tablet Take 1 tablet by mouth every 6 (six) hours as needed for moderate pain. 120 tablet 0  . lactulose (CHRONULAC) 10 GM/15ML solution Take 45-90 mLs (30-60 g total) by mouth daily as needed for mild constipation (titrate to have 2-3 Bowel movements daily.). 1000 mL 3  . latanoprost (XALATAN) 0.005 % ophthalmic solution Place 1 drop into both eyes at bedtime. 2.5 mL 0  . lisinopril (PRINIVIL,ZESTRIL) 10 MG tablet Take 1 tablet (10 mg total) by mouth daily. 30 tablet 2  . metFORMIN (GLUCOPHAGE) 500 MG tablet Take 1 tablet (500 mg total) by mouth 2 (two) times daily with a meal. 60 tablet 2  . mirtazapine (REMERON) 15 MG tablet Take 15 mg by mouth at bedtime.    . montelukast (SINGULAIR) 10 MG tablet Take 1 tablet (10 mg total) by mouth daily. 30 tablet 5  . oxybutynin (DITROPAN) 5 MG tablet Take 1 tablet (5 mg total) by mouth 2 (two) times daily. 60 tablet 0  . PARoxetine (PAXIL) 20 MG tablet Take 1 tablet (20 mg total) by mouth daily. 30 tablet 1  . traZODone (DESYREL) 50 MG tablet Take 1 tablet (50 mg total) by mouth at bedtime. 30 tablet 2   Current Facility-Administered Medications  Medication Dose Route Frequency Provider Last Rate Last Dose  . dextrose 5 % solution   Intravenous Continuous Inda Castle, MD        PAST MEDICAL HISTORY: Past Medical  History  Diagnosis Date  . Anxiety   . Cataract     had surgery for  . Diabetes mellitus   . Hypertension   . Arthritis     SHOULDER,KNEES,BACK  . Chronic hepatitis C   . Thrombocytopenia   . Encephalopathy 09/2014    [facility]  . Chronic back pain   . Joint pain   . Dementia   . Bipolar disorder     [facility]  . Depression     Sees [facility]  . Glaucoma   . Urinary incontinence   . Cataract   . Memory loss   . Hepatitis C virus   .  Cirrhosis of liver     PAST SURGICAL HISTORY: Past Surgical History  Procedure Laterality Date  . Cholecystectomy    . Polypectomy    . Cataract extraction w/ intraocular lens  implant, bilateral      FAMILY HISTORY: Family History  Problem Relation Age of Onset  . Stroke Mother   . Cancer Father   . Diabetes Sister   . Stroke Sister     SOCIAL HISTORY:  History   Social History  . Marital Status: Divorced    Spouse Name: N/A    Number of Children: 0  . Years of Education: 12 years   Occupational History  . Retired    Social History Main Topics  . Smoking status: Former Smoker -- 1.00 packs/day    Types: Cigarettes    Quit date: 12/01/2004  . Smokeless tobacco: Never Used     Comment: 43 pack year history  . Alcohol Use: No     Comment: hx/o alcohol abuse in 71s and younger  . Drug Use: No     Comment: history of drug abuse in 38s and younger  . Sexual Activity: Not on file   Other Topics Concern  . Not on file   Social History Narrative   Lives with older sister.  No driving since 75/4492.  Cooks for her self, bathes herself, does most ADLs. Brother handles her bills, medications.     Right handed   No caffeine use.     PHYSICAL EXAM   Filed Vitals:   12/01/14 0900  BP: 128/70  Pulse: 166  Height: 5\' 4"  (1.626 m)  Weight: 161 lb (73.029 kg)    Not recorded      Body mass index is 27.62 kg/(m^2).   Generalized: In no acute distress  Neck: Supple, no carotid bruits   Cardiac: Regular rate rhythm  Pulmonary: Clear to auscultation bilaterally  Musculoskeletal: No deformity  Neurological examination  Mentation: Decreased facial expression, dependent on her brother to provide history, Mini-Mental Status Examination is 23 out of 30  Cranial nerve II-XII: Pupils were equal round reactive to light. Extraocular movements were full.  Visual field were full on confrontational test. Bilateral fundi were sharp.  Facial sensation and strength  were normal. Hearing was intact to finger rubbing bilaterally. Uvula tongue midline.  Head turning and shoulder shrug and were normal and symmetric.Tongue protrusion into cheek strength was normal.  Motor: Mild bilateral upper extremity rigidity, no tremor, no asterixis, no weakness  Sensory: Intact to fine touch, pinprick, preserved vibratory sensation, and proprioception at toes.  Coordination: Normal finger to nose, heel-to-shin bilaterally there was no truncal ataxia  Gait: Rising up from seated position without assistance, normal stance, without trunk ataxia, moderate stride, good arm swing, smooth turning, able to perform tiptoe, and heel walking without difficulty.  Romberg signs: Negative  Deep tendon reflexes: Brachioradialis 2/2, biceps 2/2, triceps 2/2, patellar 2/2, Achilles 2/2, plantar responses were flexor bilaterally.   DIAGNOSTIC DATA (LABS, IMAGING, TESTING) - I reviewed patient records, labs, notes, testing and imaging myself where available.  Lab Results  Component Value Date   WBC 4.9 11/25/2014   HGB 11.4* 11/25/2014   HCT 33.4* 11/25/2014   MCV 81.7 11/25/2014   PLT 128* 11/25/2014      Component Value Date/Time   NA 133* 11/18/2014 1023   K 4.2 11/18/2014 1023   CL 99 11/18/2014 1023   CO2 27 11/18/2014 1023   GLUCOSE 315* 11/18/2014 1023   BUN 21 11/18/2014 1023   CREATININE 0.64 11/18/2014 1023   CREATININE 0.58 09/11/2014 0525   CALCIUM 9.8 11/18/2014 1023   PROT 8.7* 11/18/2014 1023   ALBUMIN 3.2* 11/18/2014 1023   AST 44* 11/18/2014 1023   ALT 25 11/18/2014 1023   ALKPHOS 182* 11/18/2014 1023   BILITOT 0.6 11/18/2014 1023   GFRNONAA >90 09/11/2014 0525   GFRAA >90 09/11/2014 0525   Lab Results  Component Value Date   CHOL 199 05/17/2010   HDL 60 05/17/2010   LDLCALC 118* 05/17/2010   TRIG 103 05/17/2010   CHOLHDL 3.3 Ratio 05/17/2010   Lab Results  Component Value Date   HGBA1C 7.0* 11/25/2014   Lab Results  Component Value Date    VITAMINB12 838 09/09/2014   Lab Results  Component Value Date   TSH 0.337* 09/09/2014      ASSESSMENT AND PLAN  [place] Bas is a 69 y.o. 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 5, laboratory evaluations   Orders Placed This Encounter  Procedures  . EEG adult    New Prescriptions   No medications on file    There are no discontinued medications.  Return in about 2 months (around 01/30/2015).   Marcial Pacas, M.D. Ph.D.  [facility] [address] [place], [place] [address] [phone]

## 2014-12-07 ENCOUNTER — Ambulatory Visit (INDEPENDENT_AMBULATORY_CARE_PROVIDER_SITE_OTHER): Payer: Medicare Other | Admitting: Neurology

## 2014-12-07 DIAGNOSIS — G9340 Encephalopathy, unspecified: Secondary | ICD-10-CM

## 2014-12-07 DIAGNOSIS — B182 Chronic viral hepatitis C: Secondary | ICD-10-CM | POA: Diagnosis not present

## 2014-12-07 DIAGNOSIS — K7682 Hepatic encephalopathy: Secondary | ICD-10-CM

## 2014-12-07 DIAGNOSIS — K7290 Hepatic failure, unspecified without coma: Secondary | ICD-10-CM

## 2014-12-07 DIAGNOSIS — F3170 Bipolar disorder, currently in remission, most recent episode unspecified: Secondary | ICD-10-CM

## 2014-12-17 DIAGNOSIS — F3181 Bipolar II disorder: Secondary | ICD-10-CM | POA: Diagnosis not present

## 2014-12-18 ENCOUNTER — Telehealth: Payer: Self-pay | Admitting: Medical

## 2014-12-18 ENCOUNTER — Ambulatory Visit (INDEPENDENT_AMBULATORY_CARE_PROVIDER_SITE_OTHER): Payer: Medicare Other | Admitting: Medical

## 2014-12-18 ENCOUNTER — Ambulatory Visit
Admission: RE | Admit: 2014-12-18 | Discharge: 2014-12-18 | Disposition: A | Discharge status: Home / Self Care | Payer: Medicare Other | Source: Ambulatory Visit | Attending: Medical | Admitting: Medical

## 2014-12-18 ENCOUNTER — Encounter: Payer: Self-pay | Admitting: Medical

## 2014-12-18 VITALS — BP 100/68 | HR 80 | Temp 97.8°F | Resp 14 | Wt 165.0 lb

## 2014-12-18 DIAGNOSIS — G9340 Encephalopathy, unspecified: Secondary | ICD-10-CM

## 2014-12-18 DIAGNOSIS — F3130 Bipolar disorder, current episode depressed, mild or moderate severity, unspecified: Secondary | ICD-10-CM

## 2014-12-18 DIAGNOSIS — M25562 Pain in left knee: Secondary | ICD-10-CM

## 2014-12-18 DIAGNOSIS — F411 Generalized anxiety disorder: Secondary | ICD-10-CM | POA: Diagnosis not present

## 2014-12-18 DIAGNOSIS — G4700 Insomnia, unspecified: Secondary | ICD-10-CM | POA: Diagnosis not present

## 2014-12-18 DIAGNOSIS — B182 Chronic viral hepatitis C: Secondary | ICD-10-CM | POA: Diagnosis not present

## 2014-12-18 DIAGNOSIS — F319 Bipolar disorder, unspecified: Secondary | ICD-10-CM

## 2014-12-18 DIAGNOSIS — E119 Type 2 diabetes mellitus without complications: Secondary | ICD-10-CM | POA: Diagnosis not present

## 2014-12-18 DIAGNOSIS — M1712 Unilateral primary osteoarthritis, left knee: Secondary | ICD-10-CM | POA: Diagnosis not present

## 2014-12-18 DIAGNOSIS — M25462 Effusion, left knee: Secondary | ICD-10-CM | POA: Diagnosis not present

## 2014-12-18 NOTE — Addendum Note (Signed)
Addended by: Carlena Hurl on: 12/18/2014 10:45 AM   Modules accepted: Orders

## 2014-12-18 NOTE — Telephone Encounter (Signed)
Rcvd refill request for Paroxetine HCL 20MG  #30

## 2014-12-18 NOTE — Progress Notes (Signed)
Subjective: here for recheck.  Here with her son [name] who is taking care of her. At her first visit here January 20 she came in status post hospitalization for encephalopathy.  Her medical problems include type 2 diabetes, chronic hepatitis C, hypertension, history of congestive heart or, depression, bipolar, anxiety, possible dementia, thrombocytopenia, former smoker, history of substance abuse, glaucoma, mixed incontinence, chronic back pain, insomnia.  She had a consult with neurology recently with Dr. Marcial Pacas, and given her mental status changes and encephalopathy, differential diagnosis included central nervous system degenerative disorder versus superimposed hepatitis encephalopathy. They will be getting an EEG, and pending follow-up with neurology in 2 months may start Namenda or Aricept.   She saw psychiatry at [facility] yesterday and trazodone was increased to 100 mg daily QHS.    She reports left knee pain.  She notes pain with walking.  Denies fall, injury, seems to be swollen, x 3 weeks.  Has tried to elevated the leg.  hasn't taking anything for pain.  Sees hepatitis clinic around March first week.  Finished the treatment in December.   Diabetes type 2 - not checking glucose.  Taking Metformin 500mg  BID.    Past Medical History  Diagnosis Date  . Anxiety   . Cataract     had surgery for  . Diabetes mellitus   . Hypertension   . Arthritis     SHOULDER,KNEES,BACK  . Chronic hepatitis C   . Thrombocytopenia   . Encephalopathy 09/2014    [facility]  . Chronic back pain   . Joint pain   . Dementia   . Bipolar disorder     [facility]  . Depression     Sees [facility]  . Glaucoma   . Urinary incontinence   . Cataract   . Memory loss   . Hepatitis C virus   . Cirrhosis of liver     Review of systems as in subjective  Objective: BP 100/68 mmHg  Pulse 80  Temp(Src) 97.8 F (36.6 C) (Oral)  Resp 14  Wt 165 lb (74.844  kg)  Gen: wd, wn, nad Neuro: dozing off some during the interview today Mild generalized swelling of left knee, she voiced pain with any palpation, but tenderness seems to be mostly over joint line, no obvious anxiety or deformity, mild pain with full left knee extension, and mild pain with ROM in general, but knee ROM relatively full.  Hips and ankles nontender, no pain with ROM which seems relatively full Normal leg sensation, DTRs, strength, she does walk with caution and assistance from son Pulses WNL  Assessment: Encounter Diagnoses  Name Primary?  . Chronic hepatitis C without hepatic coma Yes  . Encephalopathy   . Bipolar depression   . Anxiety state   . Insomnia   . Left knee pain   . Knee swelling, left   . Diabetes type 2, controlled      Plan: Chronic hepatitis C - has f/u early March with hepatitis clinic.  Completed treatment 09/2015.  Encephalopathy, insomnia-pending follow-up with neurology, had EEG recently  Bipolar depression and anxiety-she has establish with [facility], continue current medications.  She is seeing Pauline Good family nurse practitioner with [facility]. We will send copy of labs and office notes for collaborative care.   Insomnia - begin the Trazodone 100mg  just prescribed by psychiatry.  Son to check to see if she is also taking Remeron or not?  Left knee pain - go for xray.  Likely arthritis.  Discussed possible medications.   Son will check on pain pills at home.  Per chart notes, #120 hydrocodone prescribed 09/21/2014 from another provider, but not sure if she is taking this regularly or not.  She bought some tylenol OTC recently.  We discussed possibly using Aleve once daily for 5-7 days for pain and swelling, likely arthritis.  Advised not to double upon any of these medications for pain (norco, Tylenol, Aleve).  Diabetes type 2 - c/t Metformin BID, discussed healthy diet  F/u pending xray.

## 2014-12-18 NOTE — Telephone Encounter (Signed)
This should be refilled by [facility], so have them let [facility] know.  She just saw them so it should be a simple call to them.

## 2014-12-21 NOTE — Telephone Encounter (Signed)
Called and spoke to brother, [name]. Informed him of Shane's instructions regarding med refill

## 2014-12-23 ENCOUNTER — Ambulatory Visit (INDEPENDENT_AMBULATORY_CARE_PROVIDER_SITE_OTHER): Payer: Medicare Other | Admitting: Family Medicine

## 2014-12-23 ENCOUNTER — Encounter: Payer: Self-pay | Admitting: Internal Medicine

## 2014-12-23 VITALS — BP 124/70

## 2014-12-23 DIAGNOSIS — M129 Arthropathy, unspecified: Secondary | ICD-10-CM | POA: Diagnosis not present

## 2014-12-23 DIAGNOSIS — M1712 Unilateral primary osteoarthritis, left knee: Secondary | ICD-10-CM | POA: Insufficient documentation

## 2014-12-23 MED ORDER — LIDOCAINE HCL 2 % IJ SOLN
2.0000 mL | Freq: Once | INTRAMUSCULAR | Status: AC
  Administered 2014-12-23: 40 mg via INTRADERMAL

## 2014-12-23 MED ORDER — TRIAMCINOLONE ACETONIDE 40 MG/ML IJ SUSP
40.0000 mg | Freq: Once | INTRAMUSCULAR | Status: AC
  Administered 2014-12-23: 40 mg via INTRAMUSCULAR

## 2014-12-23 NOTE — Progress Notes (Signed)
   Subjective:    Patient ID: Carol Rubio, female    DOB: [birth year], 69 y.o.   MRN: [id number]  HPI She is here for consult concerning continued difficulty with left knee pain. A recent x-ray did show extensive arthritic changes in her knee. She is here for consultation concerning this.   Review of Systems     Objective:   Physical Exam Left knee does show a slight effusion with difficulty to feel the bony landmarks. Pain on motion of the knee.       Assessment & Plan:  Arthritis of left knee  I explained the extent of her knee arthritis to her. Discussed possible options including injections as well as knee replacement. They're interested in having an injection. The knee was prepped medially with Betadine. The joint line was identified. 40 mg of Kenalog and 3 mL of Xylocaine was injected into the joint without difficulty. She tolerated the procedure well and did obtain relief of her pain. I explained that the extent of relief of her pain in terms of weeks versus months will help determine when and if neck shot will be given as opposed to seeing orthopedics.

## 2014-12-31 NOTE — H&P (Signed)
HISTORY AND PHYSICAL  Carol Rubio is a 69 y.o. female patient referred by general dentist for multiple dental extractions.Was scheduled at office 12/16/2014 for IV sedation but IV access not obtained.  No diagnosis found.  Past Medical History  Diagnosis Date  . Anxiety   . Cataract     had surgery for  . Diabetes mellitus   . Hypertension   . Arthritis     SHOULDER,KNEES,BACK  . Chronic hepatitis C   . Thrombocytopenia   . Encephalopathy 09/2014    [facility]  . Chronic back pain   . Joint pain   . Dementia   . Bipolar disorder     [facility]  . Depression     Sees [facility]  . Glaucoma   . Urinary incontinence   . Cataract   . Memory loss   . Hepatitis C virus   . Cirrhosis of liver     Current Facility-Administered Medications  Medication Dose Route Frequency Provider Last Rate Last Dose  . dextrose 5 % solution   Intravenous Continuous Inda Castle, MD       Current Outpatient Prescriptions  Medication Sig Dispense Refill  . amLODipine (NORVASC) 10 MG tablet Take 1 tablet (10 mg total) by mouth daily. 30 tablet 2  . ARIPiprazole (ABILIFY) 10 MG tablet Take 1 tablet (10 mg total) by mouth daily. 30 tablet 1  . carvedilol (COREG) 3.125 MG tablet Take 1 tablet (3.125 mg total) by mouth 2 (two) times daily with a meal. 60 tablet 2  . furosemide (LASIX) 40 MG tablet Take 1 tablet (40 mg total) by mouth 2 (two) times daily. 30 tablet 1  . HYDROcodone-acetaminophen (NORCO/VICODIN) 5-325 MG per tablet Take 1 tablet by mouth every 6 (six) hours as needed for moderate pain. (Patient not taking: Reported on 12/18/2014) 120 tablet 0  . lactulose (CHRONULAC) 10 GM/15ML solution Take 45-90 mLs (30-60 g total) by mouth daily as needed for mild constipation (titrate to have 2-3 Bowel movements daily.). 1000 mL 3  . latanoprost (XALATAN) 0.005 % ophthalmic solution Place 1 drop into both eyes at bedtime. 2.5 mL 0  . lisinopril (PRINIVIL,ZESTRIL) 10  MG tablet Take 1 tablet (10 mg total) by mouth daily. 30 tablet 2  . metFORMIN (GLUCOPHAGE) 500 MG tablet Take 1 tablet (500 mg total) by mouth 2 (two) times daily with a meal. 60 tablet 2  . mirtazapine (REMERON) 15 MG tablet Take 15 mg by mouth at bedtime.    . montelukast (SINGULAIR) 10 MG tablet Take 1 tablet (10 mg total) by mouth daily. 30 tablet 5  . oxybutynin (DITROPAN) 5 MG tablet Take 1 tablet (5 mg total) by mouth 2 (two) times daily. 60 tablet 0  . PARoxetine (PAXIL) 20 MG tablet Take 1 tablet (20 mg total) by mouth daily. (Patient not taking: Reported on 12/23/2014) 30 tablet 1  . traZODone (DESYREL) 50 MG tablet Take 1 tablet (50 mg total) by mouth at bedtime. (Patient taking differently: Take 100 mg by mouth at bedtime. ) 30 tablet 2   No Known Allergies Active Problems:   * No active hospital problems. *  Vitals: There were no vitals taken for this visit. Lab results:No results found for this or any previous visit (from the past 76 hour(s)). Radiology Results: No results found. General appearance: alert, cooperative and moderately obese Head: Normocephalic, without obvious abnormality, atraumatic Eyes: negative Nose: Nares normal. Septum midline. Mucosa normal. No drainage or sinus tenderness. Throat:  Super erupted tooth #1, Deep decay teeth # 6, 7, 8, 9, 10, 11. Pharynx clear. Neck: no adenopathy, supple, symmetrical, trachea midline and thyroid not enlarged, symmetric, no tenderness/mass/nodules Resp: clear to auscultation bilaterally Cardio: regular rate and rhythm, S1, S2 normal, no murmur, click, rub or gallop  Assessment: non-restorable teeth # 1, 6, 7, 8, 9, 10, 11  Plan:Dental extractions. GA. Day surgery.   Gae Bon 12/31/2014

## 2015-01-01 NOTE — Procedures (Signed)
   HISTORY: 69 years old female, with hepatitis C, history of hepatic encephalopathy, overall has improved since her treatment  TECHNIQUE:  16 channel EEG was performed based on standard 10-16 international system. One channel was dedicated to EKG, which has demonstrates normal sinus rhythm of beats per minutes.  Upon awakening, the posterior background activity was mildly dysarrhythmic, in the theta range, reactive to eye opening and closure.  There was frequent muscle, and electrode artifact. There was no evidence of epileptiform discharge.  Photic stimulation was performed, which induced a symmetric photic driving.  Hyperventilation was performed, there was no abnormality elicit other than mentioned above.  Stage II sleep was achieved.  CONCLUSION: This is a mild abnormal EEG.  There was evidence of mild background slowing, which can be seen patient with metabolic toxic etiology. There was no evidence of epileptiform discharge.

## 2015-01-04 ENCOUNTER — Ambulatory Visit (HOSPITAL_COMMUNITY)
Admission: RE | Admit: 2015-01-04 | Discharge: 2015-01-04 | Disposition: A | Discharge status: Home / Self Care | Payer: Medicare Other | Source: Ambulatory Visit | Attending: Anesthesiology | Admitting: Anesthesiology

## 2015-01-04 ENCOUNTER — Encounter (HOSPITAL_COMMUNITY): Payer: Self-pay

## 2015-01-04 ENCOUNTER — Encounter (HOSPITAL_COMMUNITY): Payer: Self-pay | Admitting: Vascular Surgery

## 2015-01-04 ENCOUNTER — Encounter (HOSPITAL_COMMUNITY)
Admission: RE | Admit: 2015-01-04 | Discharge: 2015-01-04 | Disposition: A | Discharge status: Home / Self Care | Payer: Medicare Other | Source: Ambulatory Visit | Attending: Oral Surgery | Admitting: Oral Surgery

## 2015-01-04 DIAGNOSIS — D696 Thrombocytopenia, unspecified: Secondary | ICD-10-CM | POA: Insufficient documentation

## 2015-01-04 DIAGNOSIS — F319 Bipolar disorder, unspecified: Secondary | ICD-10-CM | POA: Insufficient documentation

## 2015-01-04 DIAGNOSIS — Z01812 Encounter for preprocedural laboratory examination: Secondary | ICD-10-CM | POA: Diagnosis not present

## 2015-01-04 DIAGNOSIS — Z01818 Encounter for other preprocedural examination: Secondary | ICD-10-CM

## 2015-01-04 DIAGNOSIS — Z79899 Other long term (current) drug therapy: Secondary | ICD-10-CM | POA: Diagnosis not present

## 2015-01-04 DIAGNOSIS — E119 Type 2 diabetes mellitus without complications: Secondary | ICD-10-CM | POA: Insufficient documentation

## 2015-01-04 DIAGNOSIS — I10 Essential (primary) hypertension: Secondary | ICD-10-CM | POA: Diagnosis not present

## 2015-01-04 DIAGNOSIS — K089 Disorder of teeth and supporting structures, unspecified: Secondary | ICD-10-CM | POA: Insufficient documentation

## 2015-01-04 DIAGNOSIS — K7460 Unspecified cirrhosis of liver: Secondary | ICD-10-CM | POA: Diagnosis not present

## 2015-01-04 DIAGNOSIS — J849 Interstitial pulmonary disease, unspecified: Secondary | ICD-10-CM | POA: Diagnosis not present

## 2015-01-04 DIAGNOSIS — B1920 Unspecified viral hepatitis C without hepatic coma: Secondary | ICD-10-CM | POA: Diagnosis not present

## 2015-01-04 HISTORY — DX: Personal history of urinary calculi: Z87.442

## 2015-01-04 LAB — CBC
HCT: 35.4 % — ABNORMAL LOW (ref 36.0–46.0)
Hemoglobin: 11.7 g/dL — ABNORMAL LOW (ref 12.0–15.0)
MCH: 28.2 pg (ref 26.0–34.0)
MCHC: 33.1 g/dL (ref 30.0–36.0)
MCV: 85.3 fL (ref 78.0–100.0)
Platelets: 93 10*3/uL — ABNORMAL LOW (ref 150–400)
RBC: 4.15 MIL/uL (ref 3.87–5.11)
RDW: 17.3 % — ABNORMAL HIGH (ref 11.5–15.5)
WBC: 4 10*3/uL (ref 4.0–10.5)

## 2015-01-04 LAB — COMPREHENSIVE METABOLIC PANEL
ALT: 30 U/L (ref 0–35)
AST: 37 U/L (ref 0–37)
Albumin: 2.9 g/dL — ABNORMAL LOW (ref 3.5–5.2)
Alkaline Phosphatase: 235 U/L — ABNORMAL HIGH (ref 39–117)
Anion gap: 3 — ABNORMAL LOW (ref 5–15)
BUN: 8 mg/dL (ref 6–23)
CO2: 23 mmol/L (ref 19–32)
Calcium: 8.9 mg/dL (ref 8.4–10.5)
Chloride: 108 mmol/L (ref 96–112)
Creatinine, Ser: 0.56 mg/dL (ref 0.50–1.10)
GFR calc Af Amer: >90 mL/min (ref >90)
GFR calc non Af Amer: >90 mL/min (ref >90)
Glucose, Bld: 283 mg/dL — ABNORMAL HIGH (ref 70–99)
Potassium: 3.9 mmol/L (ref 3.5–5.1)
Sodium: 134 mmol/L — ABNORMAL LOW (ref 135–145)
Total Bilirubin: 0.5 mg/dL (ref 0.3–1.2)
Total Protein: 7.7 g/dL (ref 6.0–8.3)

## 2015-01-04 LAB — PROTIME-INR
INR: 1.24 (ref 0.00–1.49)
Prothrombin Time: 15.8 seconds — ABNORMAL HIGH (ref 11.6–15.2)

## 2015-01-04 LAB — APTT: aPTT: 29 seconds (ref 24–37)

## 2015-01-04 NOTE — Pre-Procedure Instructions (Addendum)
Rowynn Mcweeney  01/04/2015   Your procedure is scheduled on: 01/08/15  Report to [facility] at 530 AM.  Call this number if you have problems the morning of surgery: [phone]   Remember:   Do not eat food or drink liquids after midnight.   Take these medicines the morning of surgery with A SIP OF WATER: amlodipine, abilify,carvedilol,eye drops, singulair, ditropan     STOP all herbel meds, nsaids (aleve,naproxen,advil,ibuprofen) starting today including vitamins, aspirin   NO diabetic med day surgery   Do not wear jewelry, make-up or nail polish.  Do not wear lotions, powders, or perfumes. You may wear deodorant.  Do not shave 48 hours prior to surgery. Men may shave face and neck.  Do not bring valuables to the hospital.  [facility] is not responsible                  for any belongings or valuables.               Contacts, dentures or bridgework may not be worn into surgery.  Leave suitcase in the car. After surgery it may be brought to your room.  For patients admitted to the hospital, discharge time is determined by your                treatment team.               Patients discharged the day of surgery will not be allowed to drive  home.  Name and phone number of your driver:   Special Instructions:  Special Instructions: [facility] - Preparing for Surgery  Before surgery, you can play an important role.  Because skin is not sterile, your skin needs to be as free of germs as possible.  You can reduce the number of germs on you skin by washing with CHG (chlorahexidine gluconate) soap before surgery.  CHG is an antiseptic cleaner which kills germs and bonds with the skin to continue killing germs even after washing.  Please DO NOT use if you have an allergy to CHG or antibacterial soaps.  If your skin becomes reddened/irritated stop using the CHG and inform your nurse when you arrive at Short Stay.  Do not shave (including legs and underarms) for at least 48  hours prior to the first CHG shower.  You may shave your face.  Please follow these instructions carefully:   1.  Shower with CHG Soap the night before surgery and the morning of Surgery.  2.  If you choose to wash your hair, wash your hair first as usual with your normal shampoo.  3.  After you shampoo, rinse your hair and body thoroughly to remove the Shampoo.  4.  Use CHG as you would any other liquid soap.  You can apply chg directly  to the skin and wash gently with scrungie or a clean washcloth.  5.  Apply the CHG Soap to your body ONLY FROM THE NECK DOWN.  Do not use on open wounds or open sores.  Avoid contact with your eyes ears, mouth and genitals (private parts).  Wash genitals (private parts)       with your normal soap.  6.  Wash thoroughly, paying special attention to the area where your surgery will be performed.  7.  Thoroughly rinse your body with warm water from the neck down.  8.  DO NOT shower/wash with your normal soap after using and rinsing off the CHG  Soap.  9.  Pat yourself dry with a clean towel.            10.  Wear clean pajamas.            11.  Place clean sheets on your bed the night of your first shower and do not sleep with pets.  Day of Surgery  Do not apply any lotions/deodorants the morning of surgery.  Please wear clean clothes to the hospital/surgery center.   Please read over the following fact sheets that you were given: Pain Booklet, Coughing and Deep Breathing and Surgical Site Infection Prevention

## 2015-01-04 NOTE — Progress Notes (Addendum)
[facility].completed study patient gained probably 40 lbs last 4 months. Lethargy, sleepiness. Increased last 4 months. Has been seen by dr during this time. Blood sugars  Run low frequently in morning. Abn cxr in November 15. Has cough at this time. cxr ordered

## 2015-01-04 NOTE — Progress Notes (Addendum)
Anesthesia Chart Review:  Patient is a 69 year old female scheduled for multiple teeth extraction (#1 and 6 - 10) on 01/08/15 by Dr. Hoyt Koch.  Apparently, he attempted to do procedure under IV sedation at his office, but could not obtain IV access so it was moved to [facility].    History includes hepatitis C with cirrhosis and thrombocytopenia and hospitalization for acute hepatic encephalopathy 08/2014, DM2, HTN, Bipolar disorder, depression, glaucoma, anxiety, cholecystectomy.   PCP is D. Dorothea Ogle, PA-C.  Her liver disease is followed at [facility] care by Roosevelt Locks, NP [phone]). She recently completed 12 week Harvoni treatment ~ 09/2014. Drazek, NP did not tend to think that patient's hospitalization for AMS was actually due to hepatic encephalopathy. Patient has been referred to a neurologist is being evaluated by Dr. Marcial Pacas. Patient lives with her 14 year old sister. Her brother [name] checks on her every morning [phone]). She receives mental health care thru [place].  Meds includes amlodipine, Abilify, Coreg, Lasix, lactulose, Xalatan, lisinopril, metformin, Remeron, Singulair, Ditropan, Paxil, Desyrel.   PAT VITALS: HR 61, BP 138/80, O2 sat 100%, RR 16, T 36.8C.  09/08/14 EKG: SR with PACs, septal infarct (age undetermined). PACs are new since 11/03/11 EKG.   09/09/14 Echo: Study Conclusions - Left ventricle: The cavity size was normal. Wall thickness was increased in a pattern of mild LVH. Systolic function was normal. The estimated ejection fraction was in the range of 60% to 65%. Wall motion was normal; there were no regional wall motion abnormalities. Features are consistent with a pseudonormal left ventricular filling pattern, with concomitant abnormal relaxation and increased filling pressure (grade 2 diastolic dysfunction). - Aortic valve: There was no stenosis. - Mitral valve: There was no significant regurgitation. - Left atrium: The atrium was mildly  dilated. - Right ventricle: The cavity size was normal. Systolic function was normal. - Tricuspid valve: Peak RV-RA gradient (S): 15 mm Hg. - Pulmonary arteries: PA peak pressure: 18 mm Hg (S). - Inferior vena cava: The vessel was normal in size. The respirophasic diameter changes were in the normal range (>= 50%), consistent with normal central venous pressure. Impressions: Normal LV size with EF 60-65%. Mild LV hypertrophy. Normal RV size and systolic function. No significant valvular abnormalities.  12/07/14 EEG: CONCLUSION: This is a mild abnormal EEG. There was evidence of mild background slowing, which can be seen patient with metabolic toxic etiology. There was no evidence of epileptiform discharge.  Preoperative labs noted. Non-fasting glucose 283.  H/H 11.7/35.4. PLT count 93K, which appears within her baseline (results called to [place] at Dr. Lupita Leash office). PT/PTT done due to history of hepatitis C and thrombocytopenia.  PT 15.8, INR 1.24, PTT 29.  A1C on 11/15/14 was 7.0.  She will get a fasting CBG on arrival.   I spoke with patient's brother [name] this after 5PM this afternoon.  He reports patient has had progressive decline, physically and mentally, over the past year. He is concerned about underlying dementia and is anxious to hear what the neurologist says at her 01/19/15 follow-up appointment.  He sets out her pills for the week, but on multiple occasions has found that the morning and afternoon counts aren't right.  This morning she told him she accidentally took her evening pills this morning--this would have included trazodone, and he subsequently felt she seemed more lethargic this morning, but was easily arousable.  He doesn't feel she is as bad as she was in 08/2014 during her admission, but still  in general feels she has been declining over the past 4 months.  She has also had approximately 40 lbs weight gain since then--including ~ 11 lbs over the past 2-3 weeks. No  history of paracentesis. He says she is still taking lactulose, but can't confirm exactly how often.  I advised him to contact Drazek, NP, or Tysinger, PA-C if needed, if he does not see any improvement in her mental status. She may need to be re-evaluated. I told him that today's labs do no include an ammonia level.  George Hugh [facility] Short Stay Center/Anesthesiology Phone [phone] 01/04/2015 6:11 PM  Addendum: I called and spoke with patient and her brother this morning. [name] was able to check on her early this morning and make sure she took the correct medications. She is much more alert today.  She is up eating breakfast now.  He was able to answer several questions for me, although was not able to answer questions about orientation to day which is not new per her brother.  She confirms that she is taking lactulose daily.  She denies SOB.  She reports she is able to ambulate by herself.  Although [name] feels his sister is doing much better today, he seemed overwhelmed taking care of both his sisters and actually went by her PCP office this morning to discuss the possibility of looking into a skilled nursing care facility.  I reviewed above with anesthesiologist Dr. Tamala Julian.  With her cirrhosis and memory issues, plan to evaluate her on the day of surgery to ensure no obvious evidence of acute decompensation. He recommends repeating labs and also checking a baseline ammonia level on the day of surgery. I have notified Dr. Lupita Leash office of plans to get same day labs since patient is currently scheduled as a first case. I told his staff that if he desires to move her from the first case slot then to let OR scheduling know.     George Hugh [facility] Short Stay Center/Anesthesiology Phone [phone] 01/05/2015 11:18 AM

## 2015-01-05 ENCOUNTER — Telehealth: Payer: Self-pay | Admitting: Medical

## 2015-01-05 NOTE — Telephone Encounter (Signed)
Pt's brother, shelton, stopped by and stated he has concerns for his sister. He states that he and his older sister maxine are concerned for Kodee. She seems to be getting much worse. He thinks maybe its time for her to be moved to a nursing care facility. Please call at [phone]

## 2015-01-06 ENCOUNTER — Telehealth: Payer: Self-pay | Admitting: Medical

## 2015-01-06 NOTE — Telephone Encounter (Signed)
Get brother on the phone

## 2015-01-06 NOTE — Telephone Encounter (Signed)
Brother on the phone

## 2015-01-06 NOTE — Telephone Encounter (Signed)
I spoke to brother Karle Starch.   He will look into nursing homes as she is hard to take care of due to her ailments, but he will bring her in tomorrow for eval of ammonia level high, acute delirium possibly.

## 2015-01-07 ENCOUNTER — Telehealth: Payer: Self-pay | Admitting: Medical

## 2015-01-07 ENCOUNTER — Encounter: Payer: Self-pay | Admitting: Medical

## 2015-01-07 ENCOUNTER — Ambulatory Visit (INDEPENDENT_AMBULATORY_CARE_PROVIDER_SITE_OTHER): Payer: Medicare Other | Admitting: Medical

## 2015-01-07 VITALS — BP 120/78 | HR 64 | Temp 98.1°F | Resp 15 | Wt 175.0 lb

## 2015-01-07 DIAGNOSIS — F319 Bipolar disorder, unspecified: Secondary | ICD-10-CM

## 2015-01-07 DIAGNOSIS — R5383 Other fatigue: Secondary | ICD-10-CM | POA: Diagnosis not present

## 2015-01-07 DIAGNOSIS — I10 Essential (primary) hypertension: Secondary | ICD-10-CM | POA: Diagnosis not present

## 2015-01-07 DIAGNOSIS — K7469 Other cirrhosis of liver: Secondary | ICD-10-CM

## 2015-01-07 DIAGNOSIS — K7290 Hepatic failure, unspecified without coma: Secondary | ICD-10-CM | POA: Diagnosis not present

## 2015-01-07 DIAGNOSIS — K7682 Hepatic encephalopathy: Secondary | ICD-10-CM

## 2015-01-07 DIAGNOSIS — R0602 Shortness of breath: Secondary | ICD-10-CM | POA: Diagnosis not present

## 2015-01-07 DIAGNOSIS — F0390 Unspecified dementia without behavioral disturbance: Secondary | ICD-10-CM

## 2015-01-07 DIAGNOSIS — R635 Abnormal weight gain: Secondary | ICD-10-CM | POA: Diagnosis not present

## 2015-01-07 DIAGNOSIS — B182 Chronic viral hepatitis C: Secondary | ICD-10-CM

## 2015-01-07 LAB — POCT URINALYSIS DIPSTICK
Bilirubin, UA: NEGATIVE
Blood, UA: NEGATIVE
Ketones, UA: NEGATIVE
Nitrite, UA: NEGATIVE
Protein, UA: NEGATIVE
Spec Grav, UA: 1.02
Urobilinogen, UA: 1
pH, UA: 6

## 2015-01-07 MED ORDER — CEFAZOLIN SODIUM-DEXTROSE 2-3 GM-% IV SOLR: 2.0000 g | INTRAVENOUS | Status: DC

## 2015-01-07 NOTE — Telephone Encounter (Signed)
Spoke with Dr Royce Macadamia office at [phone] and advised them that surgery would need to be cancelled. Audelia Acton spoke with him as well.

## 2015-01-07 NOTE — Progress Notes (Signed)
Subjective Here today with her brother and caregiver Carol Rubio.  Carol Rubio is living with her older sister who is 71yo.  Sister and brother care for her.  They dispense her medication although her sister relies on her to give herself some of her morning medications.  Brother Carol Rubio has been dispensing her evening medications and sister dispensing morning medications, but Carol Rubio is quitting his part time job to be more involved taking care of Carol Rubio.  She baths her own self.  No recent falls.   Went for preop recently with oral surgeon.  Suppose to have 7 extractions tomorrow at [place] feels this should be postponed.   preop visit nurse was concerned for her lethargy.      Carol Rubio and sister have been discussing placing her in SNF given her overall health and difficulty caring for her.    She denies urinary issues, abdominal pain, URI symptoms, other than occasional cough.  Has chronic low back pain.  Has some urinary incontinence.   otherwise mainly just been more sleepy, lethargic.  At her last psychiatry visit at [facility], trazodone was increased to 100mg  QHS along with her Remeron she already takes.   Still on Abilify.  No other aggravating or relieving factors.   No other complaint.  ROS as in subjective  Past Medical History  Diagnosis Date  . Anxiety   . Cataract     had surgery for rt  . Diabetes mellitus   . Hypertension   . Arthritis     SHOULDER,KNEES,BACK  . Chronic hepatitis C   . Thrombocytopenia   . Encephalopathy 09/2014    [facility]  . Chronic back pain   . Joint pain   . Dementia   . Bipolar disorder     [facility] Psychiatry  . Depression     Sees [facility] Psychiatry  . Glaucoma   . Urinary incontinence   . Cataract   . Memory loss   . Hepatitis C virus   . Cirrhosis of liver   . Shortness of breath dyspnea     with exersion  . History of kidney stones     Objective BP 120/78 mmHg  Pulse 64  Temp(Src) 98.1 F (36.7 C) (Oral)  Resp  15  Wt 175 lb (79.379 kg)  Wt Readings from Last 3 Encounters:  01/07/15 175 lb (79.379 kg)  01/04/15 177 lb 14 oz (80.684 kg)  12/18/14 165 lb (74.844 kg)   BP Readings from Last 3 Encounters:  01/07/15 120/78  01/04/15 138/80  12/23/14 124/70   Gen: wd, wn, nad, seems lethargic at times, dozing off in the interview Skin: slightly darker pigmentation compared to last visit, more noticeable by her brother Lungs clear Heart: RRR, normal s1, s2, no murmurs Abdomen: right sided diagonal surgical scar, mild generalized tenderness, possible ascites Back: nontender Pulses normal Ext: tense bilat LE, nonpitting 1+ edema Neuro: CN2-12 intact, left ptosis, DTRs seem WNL, slight asterixis, strength WNL, gait is slow, somewhat cautious in general, MMSE score of 18/30   Assessment: Encounter Diagnoses  Name Primary?  . Lethargy Yes  . Weight gain   . Encephalopathy, hepatic   . Essential hypertension   . Chronic hepatitis C without hepatic coma   . Other cirrhosis of liver   . Dementia, without behavioral disturbance   . Bipolar affective disorder, most recent episode unspecified type, remission status unspecified     Plan: I am concerned about recurrence of encephalopathy, elevated ammonia particularly in light  of lethargy and weight gain and noncompliance with lactulose.   Given the increased recent somnolence, discussed stopping remeron, cutting back to 50mg  trazodone and making psychiatry aware of her symptoms.  Advised they postpone her surgery scheduled for tomorrow for dental extractions.   She will go for STAT labs.  Will send urine for culture.  I also recommended Carol Rubio start to work to get her placed in SNF to better control her diet, medication compliance and overall supervision.  She doesn't appear to be in a good position to be controlling her own medications and certainly needs supervision.   F/u pending labs.

## 2015-01-07 NOTE — Telephone Encounter (Signed)
Patient coming in today  

## 2015-01-07 NOTE — Telephone Encounter (Signed)
Will you make courtesy call to her oral surgeon or hospital preop to post pone her oral surgery tomorrow in light of her symptoms and eval today.

## 2015-01-07 NOTE — Telephone Encounter (Signed)
Good news, labs don't show any major issue today.   Recommendations:  STOP Remeron  Cut down to Trazodone 50mg  the next 5-7 days, and if still staying too lethargic, cut down to 25mg  QHS  Have her take the Lactulose 2 tsp TID  C/t all other medications the same, f/u 1-2 week to see if improving  I would recommend they go visit some skilled nursing facilities and start the process of getting her placed for better supervision and management of her medications and diet  Try to encourage less day time naps, so she will naturally sleep better at night  She should not be handling her medication regimen. I think Karle Starch or sister should be dispensing her medications until she gets into different living situation.  See if other questions currently?    I'll try and print out medication reconciliation for him and pharmacy tomorrow.

## 2015-01-08 ENCOUNTER — Ambulatory Visit (HOSPITAL_COMMUNITY): Admission: RE | Admit: 2015-01-08 | Payer: Medicare Other | Source: Ambulatory Visit | Admitting: Oral Surgery

## 2015-01-08 ENCOUNTER — Encounter (HOSPITAL_COMMUNITY): Admission: RE | Payer: Self-pay | Source: Ambulatory Visit

## 2015-01-08 SURGERY — MULTIPLE EXTRACTION WITH ALVEOLOPLASTY
Anesthesia: Choice

## 2015-01-08 NOTE — Telephone Encounter (Signed)
I spoke with [name] the patients brother about Carol Ogle PA message and recommendations for her treatment and care. The brother understood and wrote everything down to make sure the changes are made accordingly. Brother will make a follow up appointment in about 2 weeks.

## 2015-01-12 NOTE — Telephone Encounter (Signed)
I printed AV summary to mail

## 2015-01-12 NOTE — Telephone Encounter (Signed)
I mailed a copy of the AV summary to the patient's brother.

## 2015-01-12 NOTE — Patient Instructions (Signed)
  Medications:  STOP these medications:  Remeron for sleep  Oxybutynin for overactive bladder  CHANGE these medications:  Decrease Trazodone to 50mg  daily at bedtime,not 100mg  daily  CONTINUE these medications:  Norvasc 10mg  daily in the morning for blood pressure  Coreg 3.125mg  twice daily for blood pressure  Lisinopril 10mg  daily flor blood pressure  Metformin 500mg  twice daily for diabetes  Lactulose 10mg /38ml, 3 tsp twice daily for constipation and ammonia  Singulair 10mg  daily at bedtime for allergies  Xalatan drops for glaucoma as labeled  Lasix 40mg  once daily in the morning for swelling  Continue the psychiatric medications Paxil, Abilify as usual   Work on [employer] manager into the skilled nursing facility to get 24/7 supervision and safe environment.  I would recommend you visit several and discuss finances with their admissions folks.  Take a tour, meet some staff, get a feel for the facilities.   Let me know when you want to get back on the schedule with the oral surgeon.   He is awaiting our call.

## 2015-01-14 DIAGNOSIS — B182 Chronic viral hepatitis C: Secondary | ICD-10-CM | POA: Diagnosis not present

## 2015-01-17 IMAGING — CR DG CHEST 2V
2 series · 2 of 2 positions shown · non-contrast
Comparison: 06/16/2013

CLINICAL DATA: Altered mental status.

EXAM:
CHEST  2 VIEW

[w chest lat]
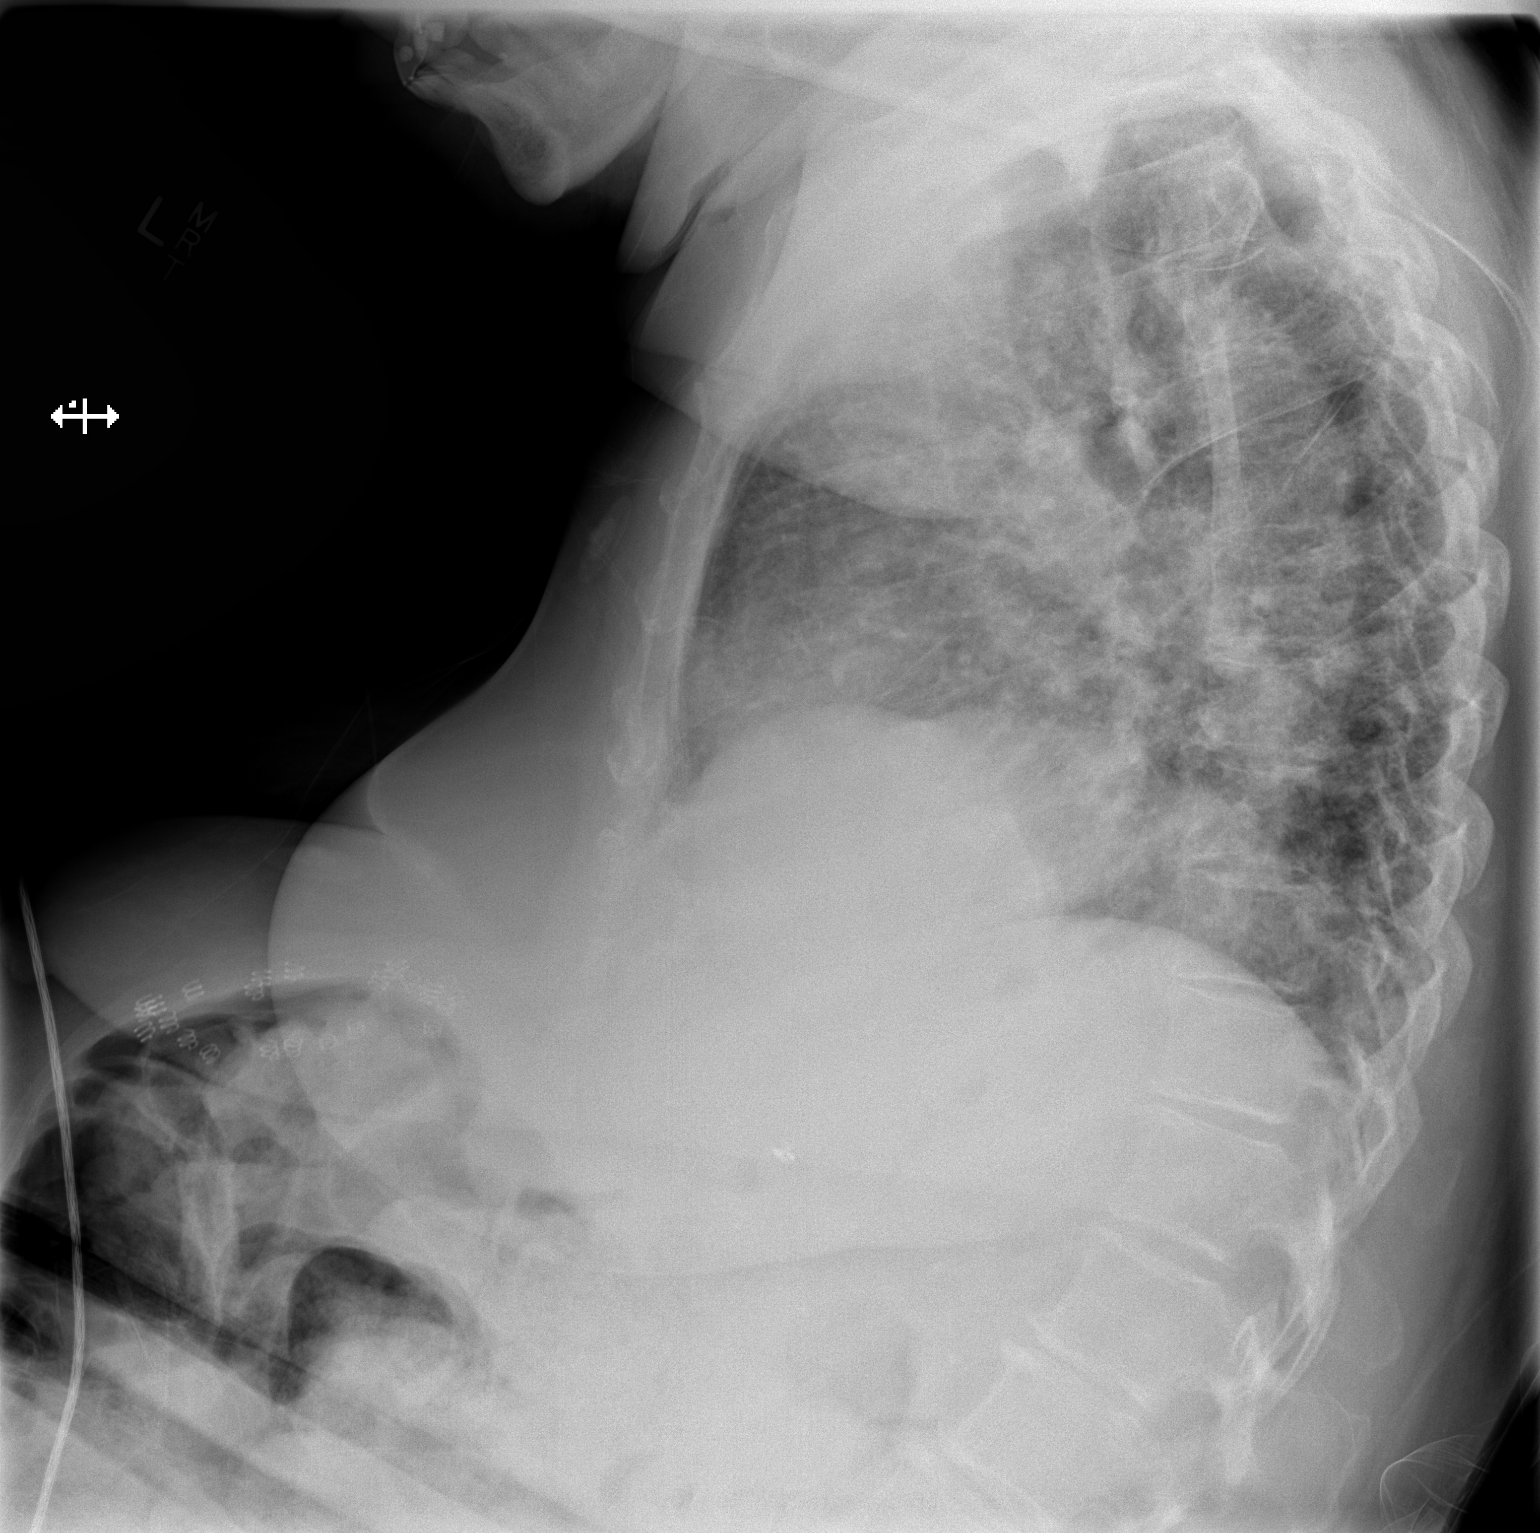

[x chest ap]
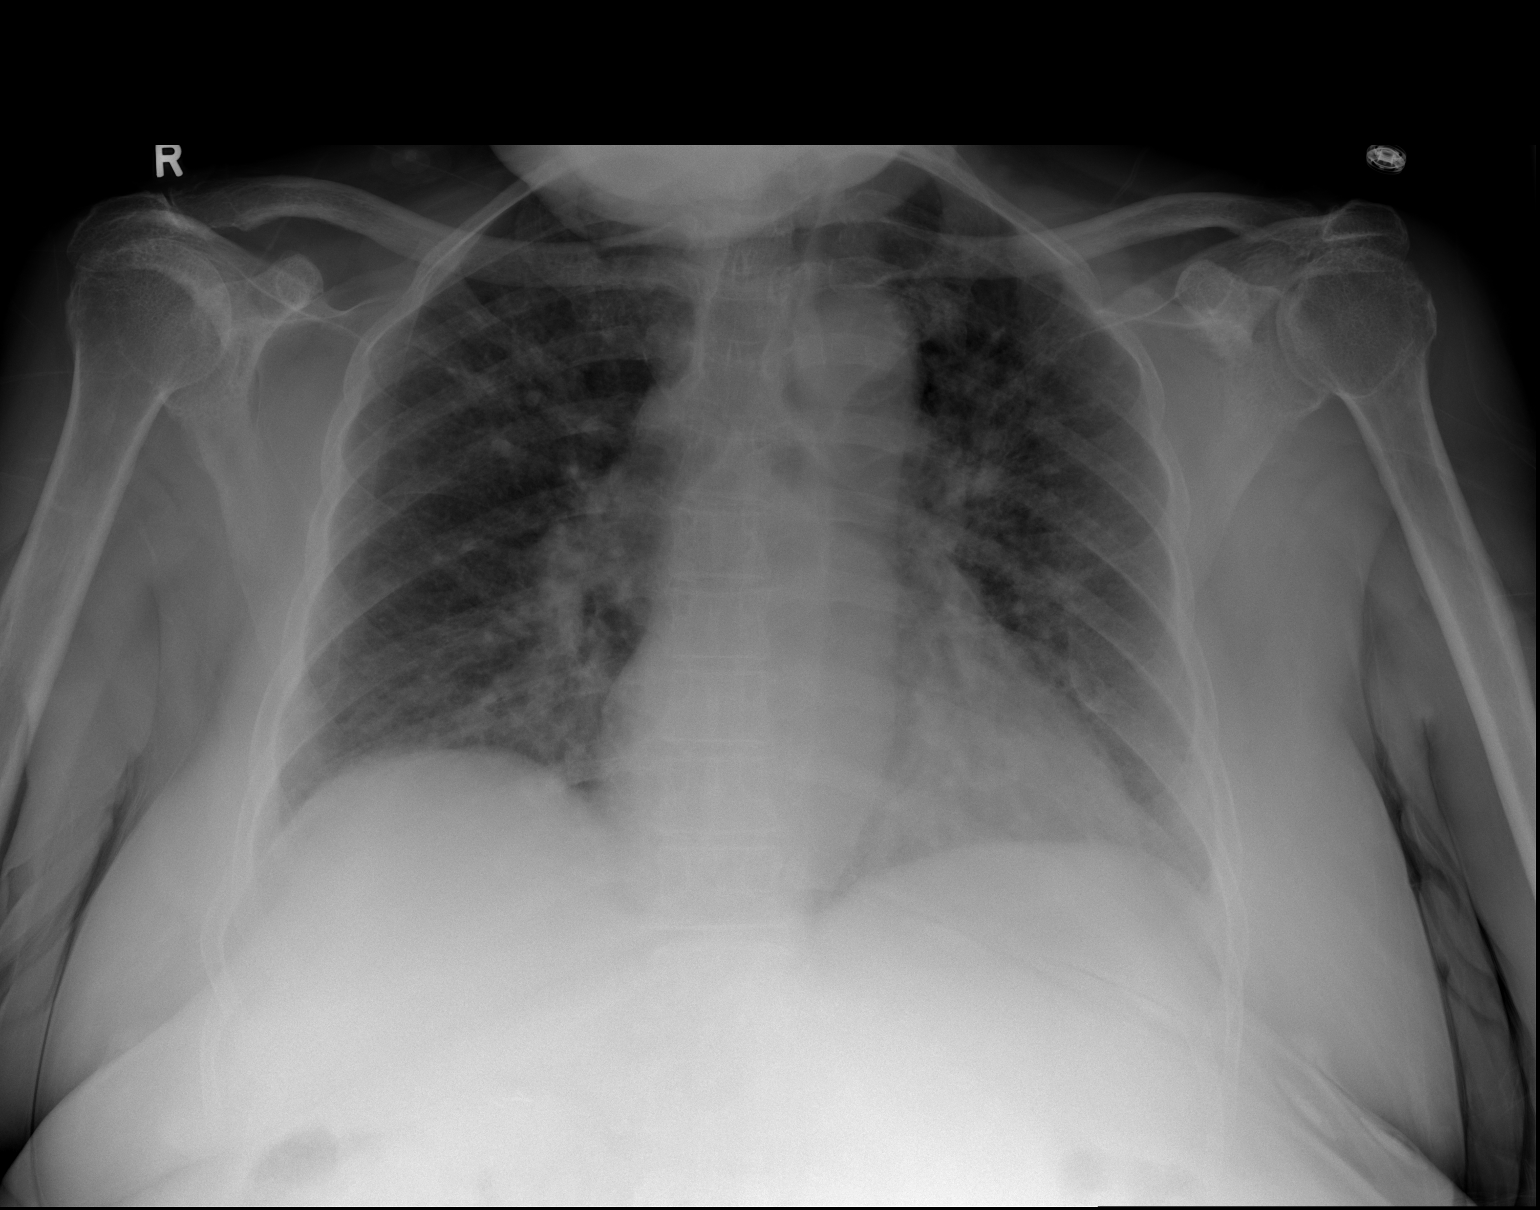

[2 of 2 positions shown; findings below may reference images not displayed]

FINDINGS: Borderline cardiomegaly. Negative aortic and hilar contours. Diffuse
interstitial coarsening with cephalized blood flow. No Kerley lines
or pleural effusion. No focal opacity confirmed on both views.
IMPRESSION: Bilateral interstitial opacity, favored congestive rather than
infectious.

## 2015-01-17 IMAGING — CT CT HEAD W/O CM
1 series · 16 of 30 positions shown, 20 images · non-contrast
Comparison: Brain MRI, 05/03/2012 and head CT, 10/05/2011.

CLINICAL DATA: Insomnia.  Weakness for 3-4 weeks.  Memory problems.

EXAM:
CT HEAD WITHOUT CONTRAST
TECHNIQUE: Contiguous axial images were obtained from the base of the skull
through the vertex without intravenous contrast.

[Series 2: head 5.0 h30s · axial · 0.43mm/px · z∈[+377,+512]mm · 16 of 31 slices shown, 20 images]
[im 2/31  brain]
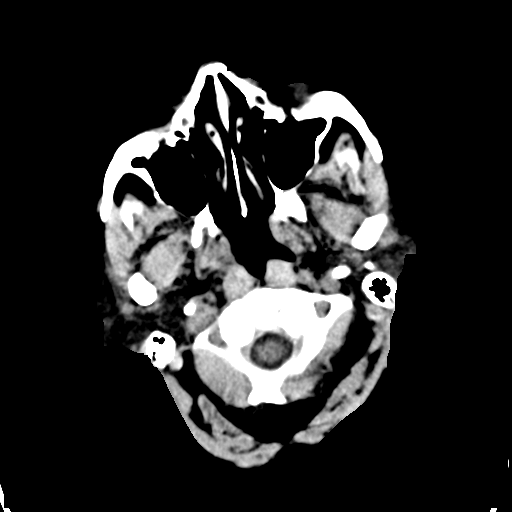
[im 2/31  bone]
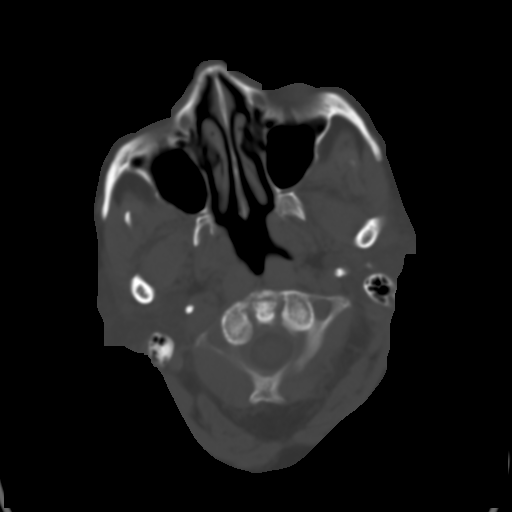
[im 4/31  brain]
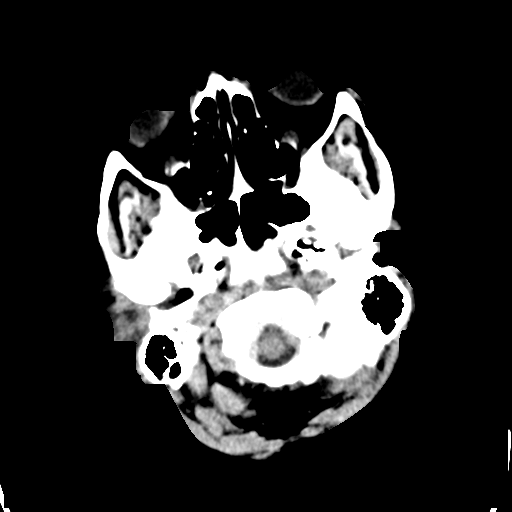
[im 6/31  brain]
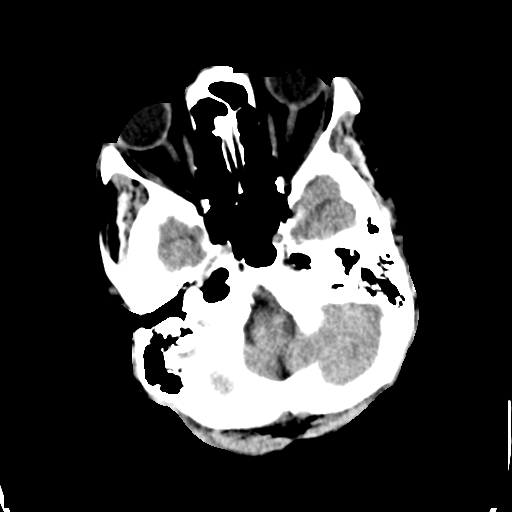
[im 8/31  brain]
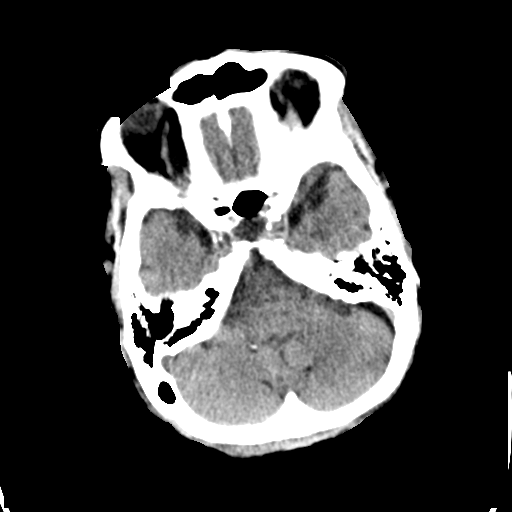
[im 9/31  brain]
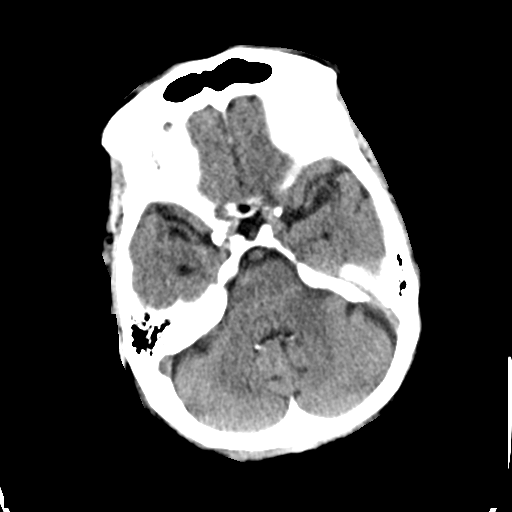
[im 9/31  bone]
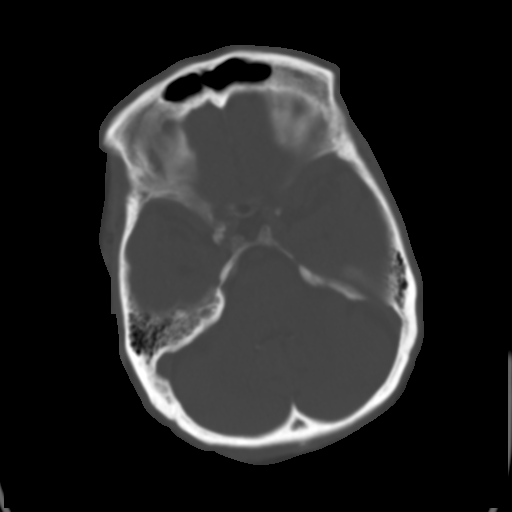
[im 11/31  brain]
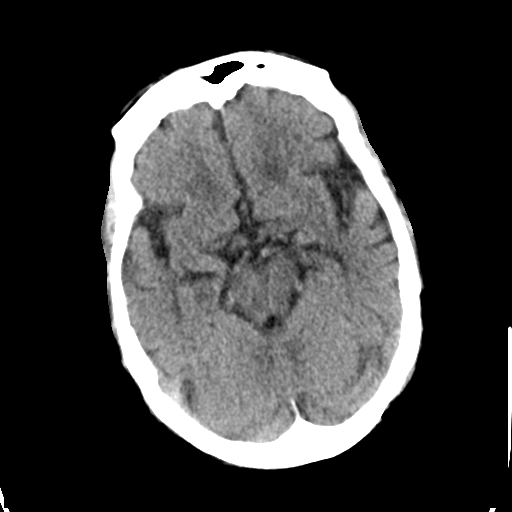
[im 13/31  brain]
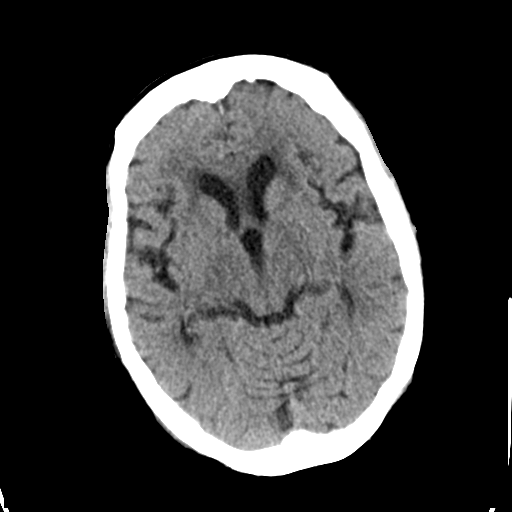
[im 15/31  brain]
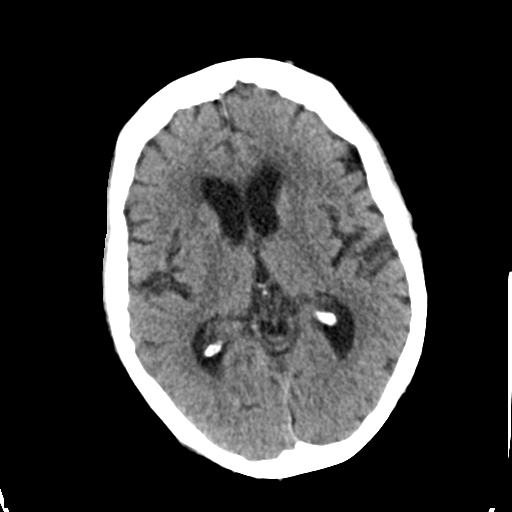
[im 16/31  brain]
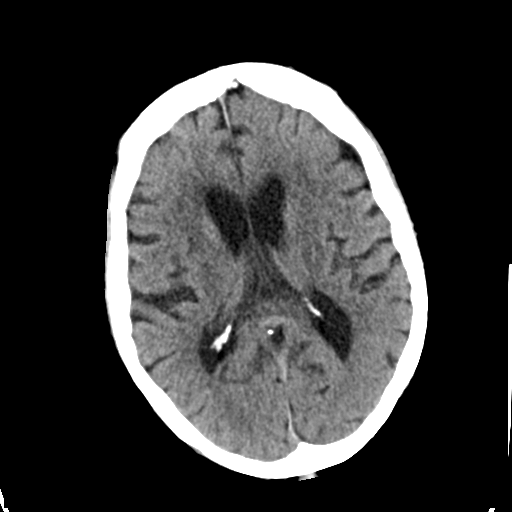
[im 16/31  bone]
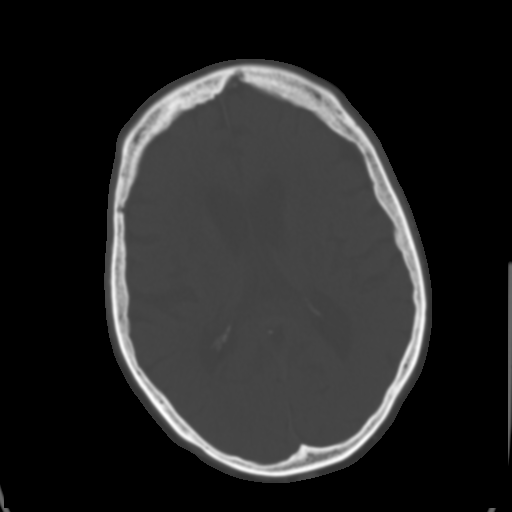
[im 18/31  brain]
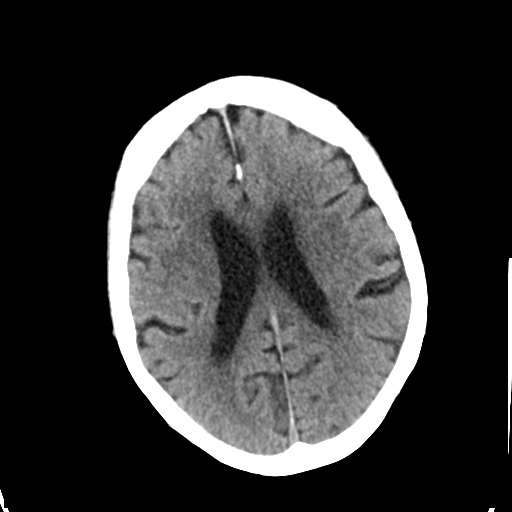
[im 20/31  brain]
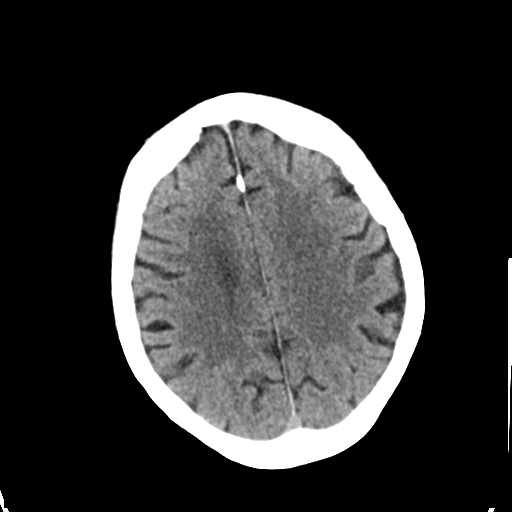
[im 22/31  brain]
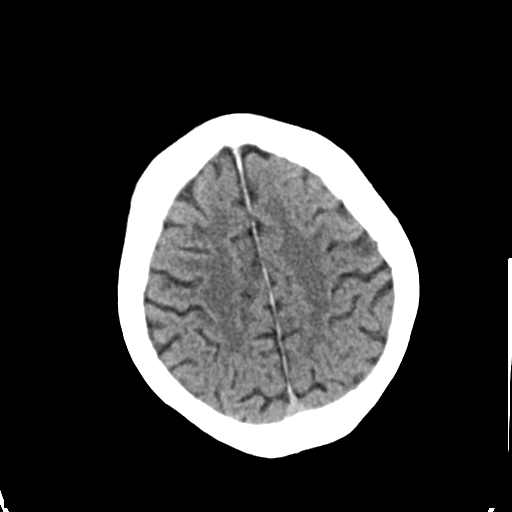
[im 23/31  brain]
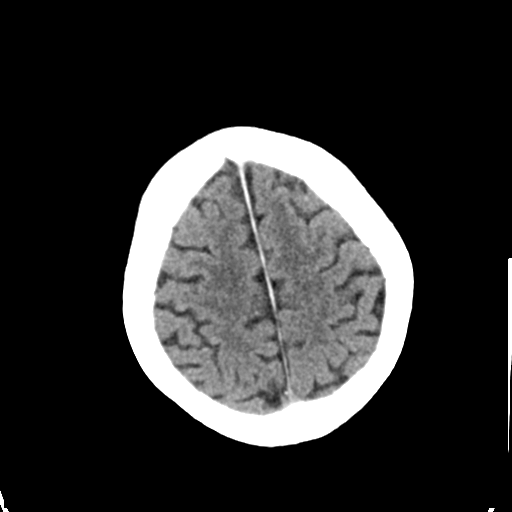
[im 23/31  bone]
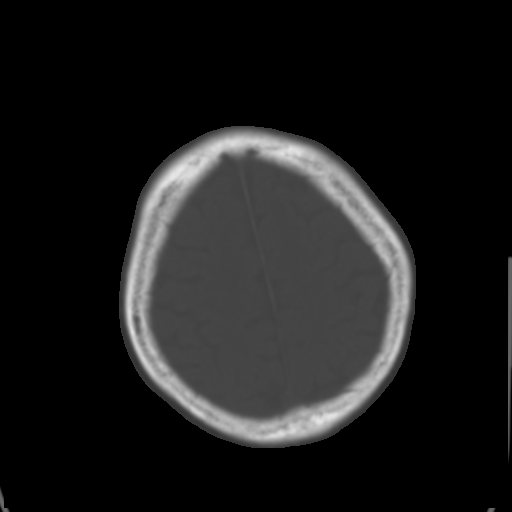
[im 25/31  brain]
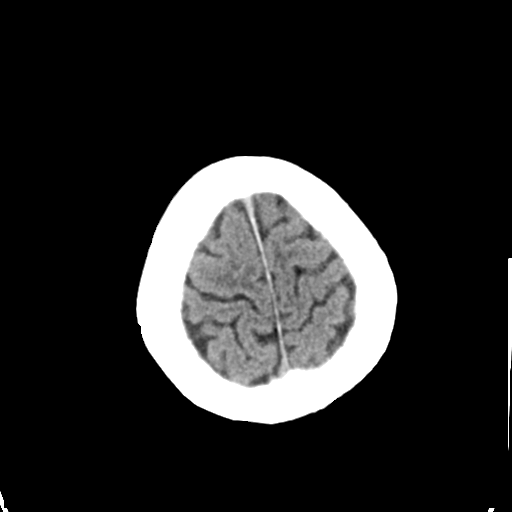
[im 27/31  brain]
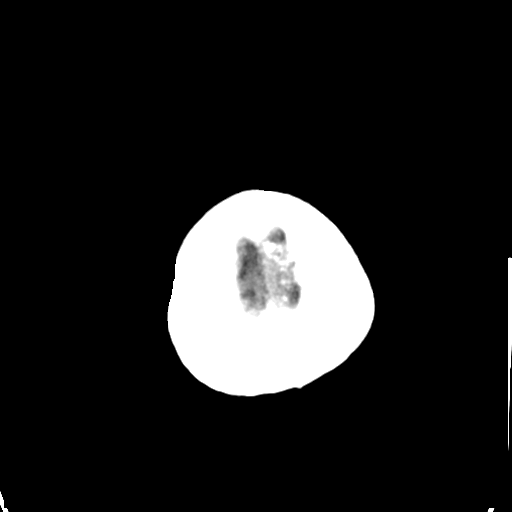
[im 29/31  brain]
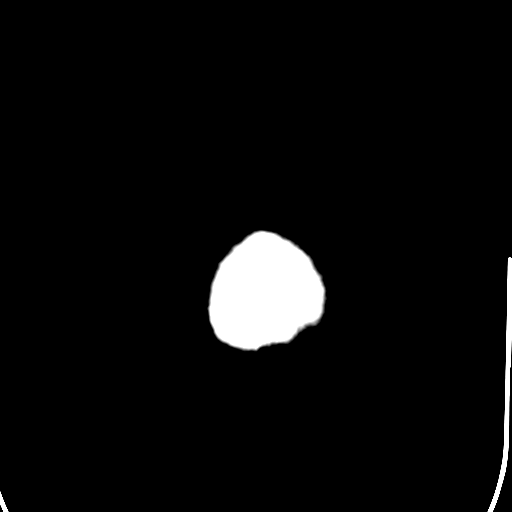

[16 of 30 positions shown; findings below may reference images not displayed]

FINDINGS: Ventricles are normal in configuration. There is ventricular and
sulcal enlargement reflecting mild atrophy. No hydrocephalus.

No parenchymal masses or mass effect. Patchy areas of white matter
hypoattenuation are noted consistent with mild chronic microvascular
ischemic change.

There is no evidence of a cortical infarct.

There are no extra-axial masses or abnormal fluid collections.

No intracranial hemorrhage.

Visualized sinuses and mastoid air cells are clear. No skull lesion.
IMPRESSION: 1. No acute intracranial abnormalities.
2. Mild atrophy and chronic microvascular ischemic change.

## 2015-01-18 DIAGNOSIS — F3181 Bipolar II disorder: Secondary | ICD-10-CM | POA: Diagnosis not present

## 2015-01-19 ENCOUNTER — Ambulatory Visit (INDEPENDENT_AMBULATORY_CARE_PROVIDER_SITE_OTHER): Payer: Medicare Other | Admitting: Neurology

## 2015-01-19 ENCOUNTER — Encounter: Payer: Self-pay | Admitting: Neurology

## 2015-01-19 VITALS — BP 125/67 | HR 66 | Ht 62.0 in | Wt 169.0 lb

## 2015-01-19 DIAGNOSIS — R413 Other amnesia: Secondary | ICD-10-CM

## 2015-01-19 DIAGNOSIS — F3170 Bipolar disorder, currently in remission, most recent episode unspecified: Secondary | ICD-10-CM | POA: Diagnosis not present

## 2015-01-19 NOTE — Progress Notes (Signed)
PATIENT: Carol Rubio DOB: 16-Nov-1945  HISTORICAL  Carol Rubio is a 69 years old right-handed African-American female, referred by her primary care physician, PA Tysinger, Shanon Brow, accompanied by her brother Carol Rubio at today's visit.  She is referred for evaluation of confusion, she had a history of hepatitis C, had hepatic encephalopathy presented with confusion, in November 2015, was admitted to the hospital, his ammonia level 103, her  mentation has much improved, after lactulose treatment, in addition, she has completed her 12 weeks course of hepatitis C treatment, virus level was nondetectable.  She used to have history of alcohol abuse, IV drug use, depression, family history of dementia, 3 of her siblings, father suffered depression,  She used to be highly functional, had rapid decline of functional status over past 6 months, since fall of 2015, overall has much improved since her treatment of hepatitis C, but not back to her baseline yet, she is not driving, forget to take her medications, lives with her elderly sister, brother checks on her daily basis.  I have reviewed CAT scan in July 2015,MRI of the brain in 2013, mild to moderate atrophy, periventricular white matter disease.  Laboratory, mild anemia, hemoglobin 10.5, elevated alkaline phosphate, AST, ALT, elevated glucose, ammonia 103 in November 6789, normal F81, folic acid, low TSH, 0.3  UPDATE March 22nd 2016:  Mild abnormal EEG. There was evidence of mild  background slowing,  She is still confused, getting worse, she lives alone, with her family check on her regularly, she forgets to take her medications, today's Mini-Mental status examination is 95 out of 75, family is planning on place her at nursing home,   Laboratory evaluation showed normal B12 825 in November 2015, low TSH, normal folic acid.  REVIEW OF SYSTEMS: Full 14 system review of systems performed and notable only for insomnia, nausea,  ALLERGIES: No  Known Allergies  HOME MEDICATIONS: Current Outpatient Prescriptions  Medication Sig Dispense Refill  . amLODipine (NORVASC) 10 MG tablet Take 1 tablet (10 mg total) by mouth daily. 30 tablet 2  . ARIPiprazole (ABILIFY) 10 MG tablet Take 1 tablet (10 mg total) by mouth daily. 30 tablet 1  . carvedilol (COREG) 3.125 MG tablet Take 1 tablet (3.125 mg total) by mouth 2 (two) times daily with a meal. 60 tablet 2  . furosemide (LASIX) 40 MG tablet Take 1 tablet (40 mg total) by mouth 2 (two) times daily. 30 tablet 1  . lactulose (CHRONULAC) 10 GM/15ML solution Take 45-90 mLs (30-60 g total) by mouth daily as needed for mild constipation (titrate to have 2-3 Bowel movements daily.). 1000 mL 3  . latanoprost (XALATAN) 0.005 % ophthalmic solution Place 1 drop into both eyes at bedtime. 2.5 mL 0  . lisinopril (PRINIVIL,ZESTRIL) 10 MG tablet Take 1 tablet (10 mg total) by mouth daily. 30 tablet 2  . metFORMIN (GLUCOPHAGE) 500 MG tablet Take 1 tablet (500 mg total) by mouth 2 (two) times daily with a meal. 60 tablet 2  . mirtazapine (REMERON) 15 MG tablet Take 15 mg by mouth at bedtime.    . montelukast (SINGULAIR) 10 MG tablet Take 1 tablet (10 mg total) by mouth daily. 30 tablet 5  . oxybutynin (DITROPAN) 5 MG tablet Take 1 tablet (5 mg total) by mouth 2 (two) times daily. 60 tablet 0  . PARoxetine (PAXIL) 20 MG tablet Take 1 tablet (20 mg total) by mouth daily. 30 tablet 1  . traZODone (DESYREL) 50 MG tablet Take 1 tablet (50 mg  total) by mouth at bedtime. (Patient taking differently: Take 100 mg by mouth at bedtime. ) 30 tablet 2   Current Facility-Administered Medications  Medication Dose Route Frequency Provider Last Rate Last Dose  . dextrose 5 % solution   Intravenous Continuous Inda Castle, MD        PAST MEDICAL HISTORY: Past Medical History  Diagnosis Date  . Anxiety   . Cataract     had surgery for rt  . Diabetes mellitus   . Hypertension   . Arthritis     SHOULDER,KNEES,BACK  .  Chronic hepatitis C   . Thrombocytopenia   . Encephalopathy 09/2014    [facility]  . Chronic back pain   . Joint pain   . Dementia   . Bipolar disorder     [facility] Psychiatry  . Depression     Sees [facility]  . Glaucoma   . Urinary incontinence   . Cataract   . Memory loss   . Hepatitis C virus   . Cirrhosis of liver   . Shortness of breath dyspnea     with exersion  . History of kidney stones     PAST SURGICAL HISTORY: Past Surgical History  Procedure Laterality Date  . Cholecystectomy    . Polypectomy    . Cataract extraction w/ intraocular lens  implant, bilateral    . Eye surgery Bilateral     FAMILY HISTORY: Family History  Problem Relation Age of Onset  . Stroke Mother   . Cancer Father   . Diabetes Sister   . Stroke Sister     SOCIAL HISTORY:  History   Social History  . Marital Status: Divorced    Spouse Name: N/A  . Number of Children: 0  . Years of Education: 12 years   Occupational History  . Retired    Social History Main Topics  . Smoking status: Former Smoker -- 1.00 packs/day for 50 years    Types: Cigarettes    Quit date: 12/01/2004  . Smokeless tobacco: Never Used     Comment: 43 pack year history  . Alcohol Use: No     Comment: hx/o alcohol abuse in 50s and youngerquit 89  . Drug Use: No     Comment: history of drug abuse in 75s and younger  . Sexual Activity: Not on file   Other Topics Concern  . Not on file   Social History Narrative   Lives with older sister.  No driving since 60/7371.  Cooks for her self, bathes herself, does most ADLs. Brother handles her bills, medications.     Right handed   No caffeine use.     PHYSICAL EXAM   Filed Vitals:   01/19/15 1023  BP: 125/67  Pulse: 66  Height: 5\' 2"  (1.575 m)  Weight: 169 lb (76.658 kg)    Not recorded      Body mass index is 30.9 kg/(m^2). PHYSICAL EXAMNIATION:  Gen: NAD, conversant, well nourised, obese, well groomed                      Cardiovascular: Regular rate rhythm, no peripheral edema, warm, nontender. Eyes: Conjunctivae clear without exudates or hemorrhage Neck: Supple, no carotid bruise. Pulmonary: Clear to auscultation bilaterally   NEUROLOGICAL EXAM:  MENTAL STATUS: Speech:    Speech is normal; fluent and spontaneous with normal comprehension.  Cognition: Drowsy, falling to sleep during conversation, depend on her brother to provide history, Mini-Mental Status  Examination is 18 out of 30, she is not oriented to date, year, season, clinic, Dr., missed 3 out of 3 recalls, has difficulties spell world backwards, could not copy figures, animal naming is 7  CRANIAL NERVES: CN II: Visual fields are full to confrontation. Fundoscopic exam is normal with sharp discs and no vascular changes. Venous pulsations are present bilaterally. Pupils are 4 mm and briskly reactive to light. Visual acuity is 20/20 bilaterally. CN III, IV, VI: extraocular movement are normal. No ptosis. CN V: Facial sensation is intact to pinprick in all 3 divisions bilaterally. Corneal responses are intact.  CN VII: Face is symmetric with normal eye closure and smile. CN VIII: Hearing is normal to rubbing fingers CN IX, X: Palate elevates symmetrically. Phonation is normal. CN XI: Head turning and shoulder shrug are intact CN XII: Tongue is midline with normal movements and no atrophy.  MOTOR: There is no pronator drift of out-stretched arms. Muscle bulk and tone are normal. Muscle strength is normal.   Shoulder abduction Shoulder external rotation Elbow flexion Elbow extension Wrist flexion Wrist extension Finger abduction Hip flexion Knee flexion Knee extension Ankle dorsi flexion Ankle plantar flexion  R 5 5 5 5 5 5 5 5 5 5 5 5   L 5 5 5 5 5 5 5 5 5 5 5 5     REFLEXES: Reflexes are 2+ and symmetric at the biceps, triceps, knees, and ankles. Plantar responses are flexor.  SENSORY: Light touch, pinprick, position sense, and vibration  sense are intact in fingers and toes.  COORDINATION: Rapid alternating movements and fine finger movements are intact. There is no dysmetria on finger-to-nose and heel-knee-shin. There are no abnormal or extraneous movements.   GAIT/STANCE: Posture is normal. Gait is steady with normal steps, base, arm swing, and turning. Heel and toe walking are normal. Tandem gait is normal.  Romberg is absent.   DIAGNOSTIC DATA (LABS, IMAGING, TESTING) - I reviewed patient records, labs, notes, testing and imaging myself where available.  Lab Results  Component Value Date   WBC 4.0 01/04/2015   HGB 11.7* 01/04/2015   HCT 35.4* 01/04/2015   MCV 85.3 01/04/2015   PLT 93* 01/04/2015      Component Value Date/Time   NA 134* 01/04/2015 1057   K 3.9 01/04/2015 1057   CL 108 01/04/2015 1057   CO2 23 01/04/2015 1057   GLUCOSE 283* 01/04/2015 1057   BUN 8 01/04/2015 1057   CREATININE 0.56 01/04/2015 1057   CREATININE 0.64 11/18/2014 1023   CALCIUM 8.9 01/04/2015 1057   PROT 7.7 01/04/2015 1057   ALBUMIN 2.9* 01/04/2015 1057   AST 37 01/04/2015 1057   ALT 30 01/04/2015 1057   ALKPHOS 235* 01/04/2015 1057   BILITOT 0.5 01/04/2015 1057   GFRNONAA >90 01/04/2015 1057   GFRAA >90 01/04/2015 1057   Lab Results  Component Value Date   CHOL 199 05/17/2010   HDL 60 05/17/2010   LDLCALC 118* 05/17/2010   TRIG 103 05/17/2010   CHOLHDL 3.3 Ratio 05/17/2010   Lab Results  Component Value Date   HGBA1C 7.0* 11/25/2014   Lab Results  Component Value Date   VITAMINB12 838 09/09/2014   Lab Results  Component Value Date   TSH 0.337* 09/09/2014      ASSESSMENT AND PLAN  Carol Rubio is a 69 y.o. 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 Family is planning on nursing home placement,  I have wondered over her medications with her brother, will consider add on Namenda once she is in the more stable, most supervised environment, Return to clinic in 6 months   Marcial Pacas, M.D. Ph.D.  [facility] [address], [place], [place] [address] [phone]

## 2015-02-08 ENCOUNTER — Other Ambulatory Visit: Payer: Self-pay | Admitting: Medical

## 2015-02-08 ENCOUNTER — Telehealth: Payer: Self-pay | Admitting: Internal Medicine

## 2015-02-08 MED ORDER — LATANOPROST 0.005 % OP SOLN: 1.0000 [drp] | Freq: Every day | OPHTHALMIC | Status: DC

## 2015-02-08 NOTE — Telephone Encounter (Signed)
[facility] request [facility]

## 2015-02-09 ENCOUNTER — Other Ambulatory Visit: Payer: Self-pay | Admitting: Family Medicine

## 2015-02-09 MED ORDER — AMLODIPINE BESYLATE 10 MG PO TABS: 10.0000 mg | ORAL_TABLET | Freq: Every day | ORAL | Status: DC

## 2015-02-09 MED ORDER — PAROXETINE HCL 20 MG PO TABS: 20.0000 mg | ORAL_TABLET | Freq: Every day | ORAL | Status: DC

## 2015-02-09 MED ORDER — LISINOPRIL 10 MG PO TABS: 10.0000 mg | ORAL_TABLET | Freq: Every day | ORAL | Status: AC

## 2015-02-09 MED ORDER — LATANOPROST 0.005 % OP SOLN: 1.0000 [drp] | Freq: Every day | OPHTHALMIC | Status: DC

## 2015-02-09 MED ORDER — FUROSEMIDE 40 MG PO TABS: 40.0000 mg | ORAL_TABLET | Freq: Two times a day (BID) | ORAL | Status: DC

## 2015-02-09 NOTE — Telephone Encounter (Signed)
Refill request for amlodipine 10mg ,lisinopril to [facility]

## 2015-02-09 NOTE — Telephone Encounter (Signed)
Refill request for furosemide 40mg , paroxetine 20mg 

## 2015-02-09 NOTE — Telephone Encounter (Signed)
Rx refills was sent to the patients pharmacy.

## 2015-02-10 ENCOUNTER — Telehealth: Payer: Self-pay | Admitting: Internal Medicine

## 2015-02-10 DIAGNOSIS — G9340 Encephalopathy, unspecified: Secondary | ICD-10-CM | POA: Diagnosis not present

## 2015-02-10 DIAGNOSIS — K7469 Other cirrhosis of liver: Secondary | ICD-10-CM | POA: Diagnosis not present

## 2015-02-10 DIAGNOSIS — B182 Chronic viral hepatitis C: Secondary | ICD-10-CM | POA: Diagnosis not present

## 2015-02-10 NOTE — Telephone Encounter (Signed)
Tell them thanks for picking up on the oversight.  Looking back, she was discharged on her "routine" medications when she came to me, so we have continued Singulair in the list, but given that I am not aware of a specific asthma or allergy diagnosis, we can D/C the Singulair.  Please D/C with pharmacy and in our EMR record.

## 2015-02-10 NOTE — Telephone Encounter (Signed)
Fax from the pharmacy stating that pt does not know why she is taking sdingulair. She reports no asthma or difficulty breathing. Please advise

## 2015-02-11 ENCOUNTER — Other Ambulatory Visit: Payer: Self-pay | Admitting: Nurse Practitioner

## 2015-02-11 DIAGNOSIS — K7469 Other cirrhosis of liver: Secondary | ICD-10-CM

## 2015-02-11 NOTE — Telephone Encounter (Signed)
LM to CB (Carol Rubio) I contacted [facility] and spoke with Nira Conn at [phone] and she said patient does not qualify for their facility. She needs a lockdown memory unit, so we need to speak with brother to see what is the next step.

## 2015-02-22 ENCOUNTER — Ambulatory Visit
Admission: RE | Admit: 2015-02-22 | Discharge: 2015-02-22 | Disposition: A | Discharge status: Home / Self Care | Payer: Medicare Other | Source: Ambulatory Visit | Attending: Medical | Admitting: Medical

## 2015-02-22 ENCOUNTER — Ambulatory Visit (INDEPENDENT_AMBULATORY_CARE_PROVIDER_SITE_OTHER): Payer: Medicare Other | Admitting: Medical

## 2015-02-22 ENCOUNTER — Encounter: Payer: Self-pay | Admitting: Medical

## 2015-02-22 ENCOUNTER — Telehealth: Payer: Self-pay | Admitting: Medical

## 2015-02-22 VITALS — BP 130/80 | HR 70 | Resp 14 | Wt 156.0 lb

## 2015-02-22 DIAGNOSIS — M545 Low back pain, unspecified: Secondary | ICD-10-CM

## 2015-02-22 DIAGNOSIS — S3992XA Unspecified injury of lower back, initial encounter: Secondary | ICD-10-CM | POA: Diagnosis not present

## 2015-02-22 DIAGNOSIS — Z9181 History of falling: Secondary | ICD-10-CM

## 2015-02-22 DIAGNOSIS — N39498 Other specified urinary incontinence: Secondary | ICD-10-CM | POA: Diagnosis not present

## 2015-02-22 LAB — POCT URINALYSIS DIPSTICK
Bilirubin, UA: NEGATIVE
Glucose, UA: NEGATIVE
Ketones, UA: NEGATIVE
Nitrite, UA: NEGATIVE
Spec Grav, UA: 1.025
Urobilinogen, UA: 1
pH, UA: 6

## 2015-02-22 MED ORDER — HYDROCODONE-ACETAMINOPHEN 5-325 MG PO TABS: 1.0000 | ORAL_TABLET | Freq: Four times a day (QID) | ORAL | Status: DC | PRN

## 2015-02-22 NOTE — Addendum Note (Signed)
Addended by: Armanda Magic on: 02/22/2015 04:23 PM   Modules accepted: Orders

## 2015-02-22 NOTE — Telephone Encounter (Signed)
Home health certification - I completed my portion of the home health certification form.    Please have Dr. Saul Fordyce.   Please make copy for Melissa for billing and notify her of this, and send/fax back copy

## 2015-02-22 NOTE — Progress Notes (Signed)
Subjective: Here for back pain.   Here with her brother Karle Starch who is her caretaker.    She notes pain in the low back across the whole back.  Pain x 3 days worse, but been bothering for a few weeks .  She actually is leaning forward when walking, only started doing this 1.5 weeks ago.  Denies fall, but Karle Starch concerned about fall risk.  Denies leg pain, no numbness, no tingling, no leg weakness. No abdominal pain.  Denies fever.  She does note 1episode of vomiting last night, but drank soda after eating some spaghetti.  No urinary changes, or urinary symptoms.  Has trouble holding the urine in general.  No problem with BMs other than one loose stool recently on lactulose.   No other aggravating or relieving factors. No other complaint.  Past Medical History  Diagnosis Date  . Anxiety   . Cataract     had surgery for rt  . Diabetes mellitus   . Hypertension   . Arthritis     SHOULDER,KNEES,BACK  . Chronic hepatitis C   . Thrombocytopenia   . Encephalopathy 09/2014    [facility]  . Chronic back pain   . Joint pain   . Dementia   . Bipolar disorder     [facility] Psychiatry  . Depression     Sees [facility] Psychiat[facility]  . Glaucoma   . Urinary incontinence   . Cataract   . Memory loss   . Hepatitis C virus   . Cirrhosis of liver   . Shortness of breath dyspnea     with exersion  . History of kidney stones    Past Surgical History  Procedure Laterality Date  . Cholecystectomy    . Polypectomy    . Cataract extraction w/ intraocular lens  implant, bilateral    . Eye surgery Bilateral     Objective: BP 130/80 mmHg  Pulse 70  Resp 14  Wt 156 lb (70.761 kg)  Gen: wd, wn, nad Skin: unremarkable, no erythema or ecchymosis Back tender mild line upper and mid lumbar region, otherwise back nontender.  Pain with ROM in general which is limited due to pain.   She is walking slowly but with leaning over stance/hip flexed a bit while walking. MSK: legs nontender, somewhat  decreased hip ROM in general bilat, no deformity Legs neurovascularly intact      Assessment: Encounter Diagnoses  Name Primary?  . Midline low back pain Yes  . Risk for falls    Plan: UA reviewed.  Will send for lumbar back xray.     Discussed possible differential.   discussed taking her time moving at home, use cane, avoid clutter, and discussed fall prevention in general.  Script for hydrocodone if worse pain, but will call with xray results and additional recommendations.

## 2015-02-23 DIAGNOSIS — H40013 Open angle with borderline findings, low risk, bilateral: Secondary | ICD-10-CM | POA: Diagnosis not present

## 2015-02-23 NOTE — Telephone Encounter (Signed)
WORKING ON THE SIGNATURE WILL FAX AND COPY TO MELISSA

## 2015-02-24 ENCOUNTER — Other Ambulatory Visit: Payer: Self-pay | Admitting: Medical

## 2015-02-24 LAB — URINE CULTURE: Colony Count: 60000

## 2015-02-24 MED ORDER — SULFAMETHOXAZOLE-TRIMETHOPRIM 800-160 MG PO TABS: 1.0000 | ORAL_TABLET | Freq: Two times a day (BID) | ORAL | Status: DC

## 2015-02-25 ENCOUNTER — Telehealth: Payer: Self-pay | Admitting: Internal Medicine

## 2015-02-25 ENCOUNTER — Telehealth: Payer: Self-pay | Admitting: Medical

## 2015-02-25 ENCOUNTER — Other Ambulatory Visit: Payer: Self-pay | Admitting: Nurse Practitioner

## 2015-02-25 ENCOUNTER — Ambulatory Visit
Admission: RE | Admit: 2015-02-25 | Discharge: 2015-02-25 | Disposition: A | Discharge status: Home / Self Care | Payer: Medicare Other | Source: Ambulatory Visit | Attending: Nurse Practitioner | Admitting: Nurse Practitioner

## 2015-02-25 DIAGNOSIS — K7469 Other cirrhosis of liver: Secondary | ICD-10-CM

## 2015-02-25 DIAGNOSIS — Z9049 Acquired absence of other specified parts of digestive tract: Secondary | ICD-10-CM | POA: Diagnosis not present

## 2015-02-25 DIAGNOSIS — K7460 Unspecified cirrhosis of liver: Secondary | ICD-10-CM | POA: Diagnosis not present

## 2015-02-25 NOTE — Telephone Encounter (Signed)
Please call about medication reconciliation.  I reviewed a [facility] letter stating that she may have not picked up medications and may actually NOT be taking some of her medications including Lisinopril, Amlodipine and Lasix. There are also duplicate entries on medication from a Dr. Dellia Nims and Dr. Ouida Sills.  Please verify what other doctors she has seen recently so we don't duplicate medication and we are aware of any recent changes?  Also, do they have another skilled nursing facility that is going to accept her.  I know they were working on this.

## 2015-02-25 NOTE — Telephone Encounter (Signed)
Lets have them fill Cipro 500mg  BID x 10 days then unless there is another interaction they recommend against.   Either Cipro or Bactrim is ok with me though given the relative risks.

## 2015-02-25 NOTE — Telephone Encounter (Signed)
Hayley from pharmacy said getting drug warning with Bactrim & Lisinopril that increases Potassium and needed to see if this is still ok for pt to take  Please call & advise

## 2015-02-25 NOTE — Telephone Encounter (Signed)
Faxed over [facility] records to [facility] @ [phone] to April 14,2016

## 2015-02-26 MED ORDER — CIPROFLOXACIN HCL 500 MG PO TABS: 500.0000 mg | ORAL_TABLET | Freq: Two times a day (BID) | ORAL | Status: DC

## 2015-02-26 NOTE — Telephone Encounter (Signed)
Called pharmacy & changed to Cipro per Shane's note

## 2015-03-02 NOTE — Telephone Encounter (Signed)
I spoke with the patient's brother who states that he is unaware of who Dr. Genene Churn and who Dr. Ouida Sills is. He states that he has been taking care of Carol Rubio since mid way last year. He said that she takes all of her medication. He said that she gets a month supply at a time. She has one more week on BP medication and he will go back next week to pick a new Rx. He said that she see's the specialists at neurology and Hep C clinic and Dr. Delman Cheadle for her eye's other than that no where else. He said that he is trying to get her in at [facility] a day center.

## 2015-03-02 NOTE — Telephone Encounter (Signed)
Ok, just make sure he reviewed what she gets from pharmacy has my name on it and not duplicated medications or the same medications with the wrong dose.    This call was prompted by pharmacy letter to [place] about multiple providers/prescription physician

## 2015-03-08 ENCOUNTER — Other Ambulatory Visit: Payer: Self-pay | Admitting: Medical

## 2015-03-08 ENCOUNTER — Other Ambulatory Visit: Payer: Self-pay | Admitting: Internal Medicine

## 2015-03-23 DIAGNOSIS — H35373 Puckering of macula, bilateral: Secondary | ICD-10-CM | POA: Diagnosis not present

## 2015-04-27 ENCOUNTER — Telehealth: Payer: Self-pay | Admitting: *Deleted

## 2015-04-27 ENCOUNTER — Ambulatory Visit: Payer: Self-pay | Admitting: Neurology

## 2015-04-27 NOTE — Telephone Encounter (Signed)
No showed follow up appt. 

## 2015-04-28 ENCOUNTER — Encounter: Payer: Self-pay | Admitting: Neurology

## 2015-05-27 ENCOUNTER — Telehealth: Payer: Self-pay | Admitting: Internal Medicine

## 2015-05-27 NOTE — Telephone Encounter (Signed)
Faxed over medical records to [facility] @ [phone] today

## 2015-08-18 ENCOUNTER — Other Ambulatory Visit: Payer: Self-pay | Admitting: Family Medicine

## 2015-08-18 DIAGNOSIS — B182 Chronic viral hepatitis C: Secondary | ICD-10-CM

## 2015-08-18 DIAGNOSIS — K7460 Unspecified cirrhosis of liver: Secondary | ICD-10-CM

## 2015-08-23 ENCOUNTER — Other Ambulatory Visit: Payer: Medicare Other

## 2015-08-23 ENCOUNTER — Ambulatory Visit
Admission: RE | Admit: 2015-08-23 | Discharge: 2015-08-23 | Disposition: A | Discharge status: Home / Self Care | Payer: Medicare Other | Source: Ambulatory Visit | Attending: Family Medicine | Admitting: Family Medicine

## 2015-08-23 ENCOUNTER — Other Ambulatory Visit: Payer: Self-pay | Admitting: Family Medicine

## 2015-08-23 DIAGNOSIS — B182 Chronic viral hepatitis C: Secondary | ICD-10-CM

## 2015-08-23 DIAGNOSIS — K7460 Unspecified cirrhosis of liver: Secondary | ICD-10-CM

## 2016-01-01 IMAGING — US US ABDOMEN LIMITED
1 series · 14 of 25 positions shown · non-contrast
Comparison: Previous examinations.

CLINICAL DATA: Chronic hepatitis-C and liver cirrhosis. Previous
cholecystectomy.

EXAM:
US ABDOMEN LIMITED - RIGHT UPPER QUADRANT

[Series 1: us abdomen limited · 0.24mm/px · 14 of 31 slices shown]
[im 1/31]
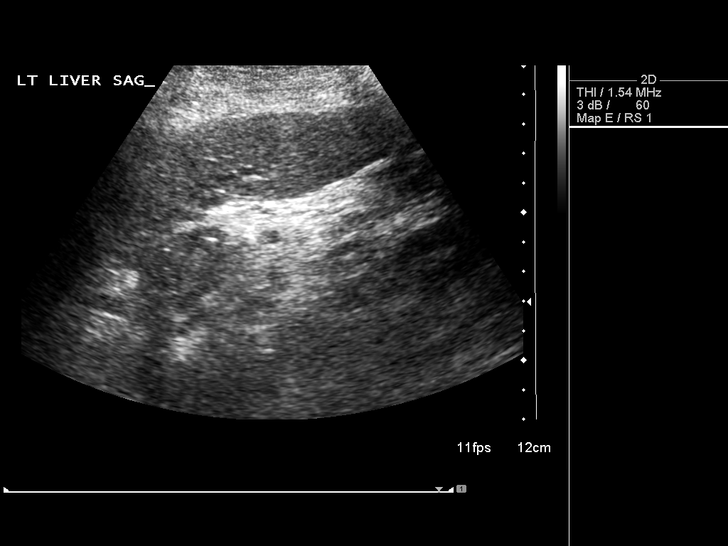
[im 3/31]
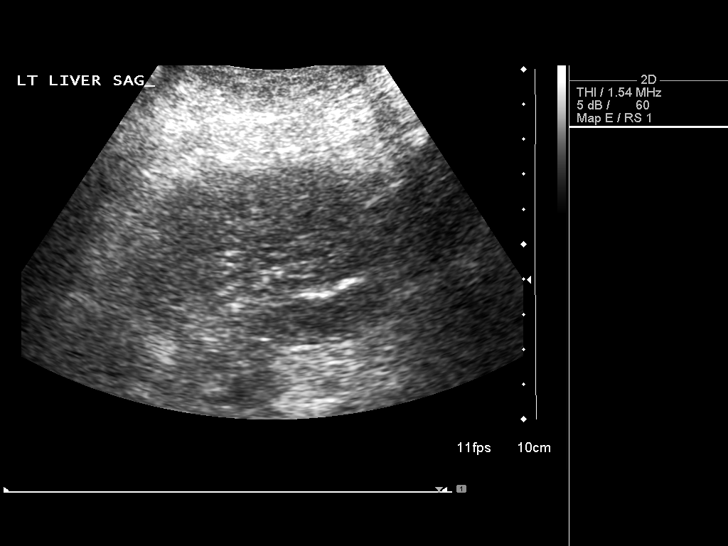
[im 6/31]
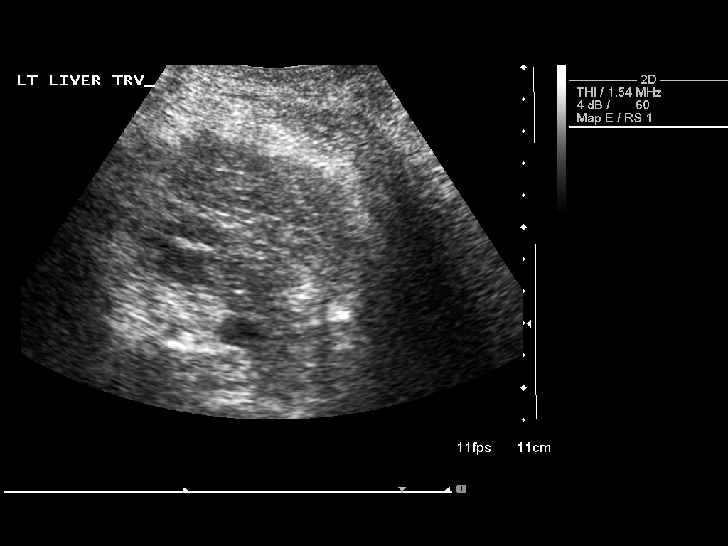
[im 8/31]
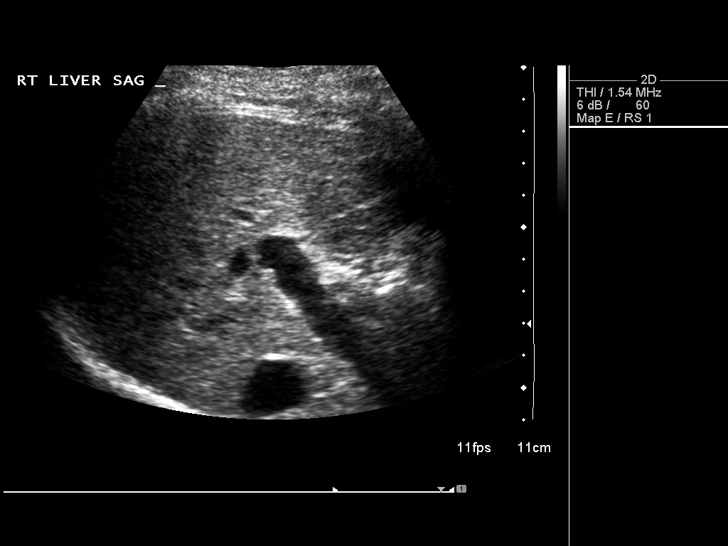
[im 11/31]
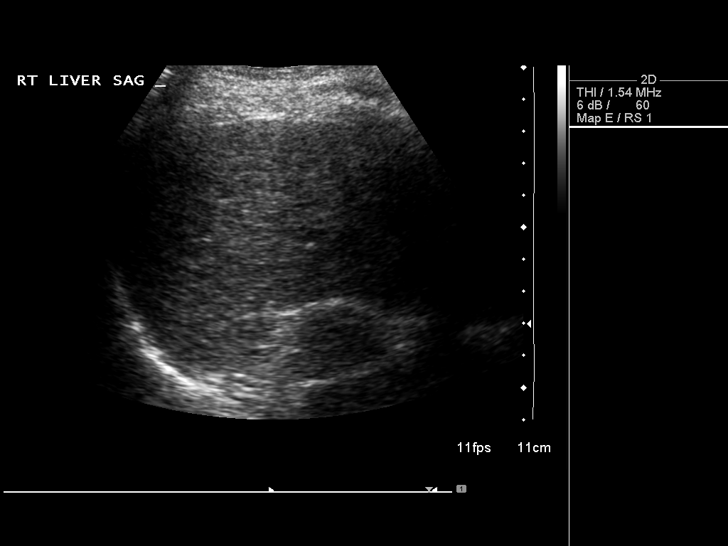
[im 12/31]
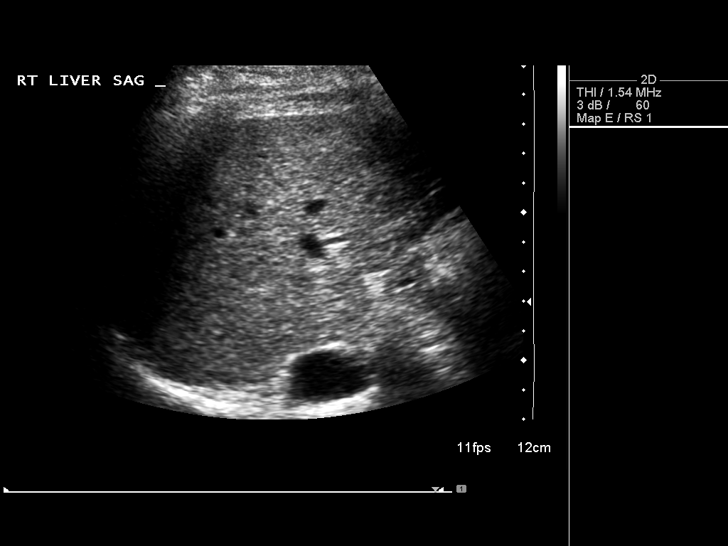
[im 14/31]
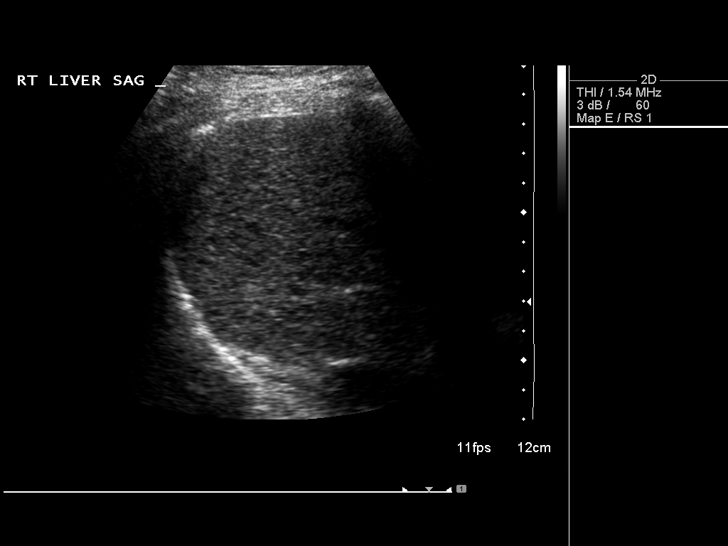
[im 17/31]
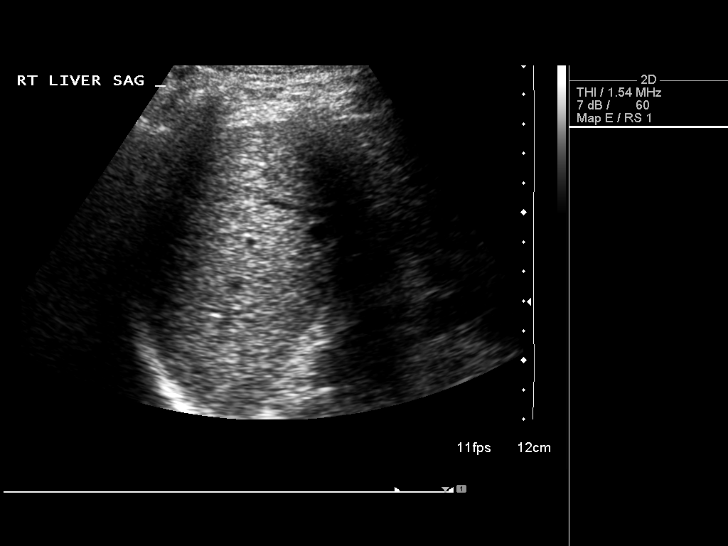
[im 19/31]
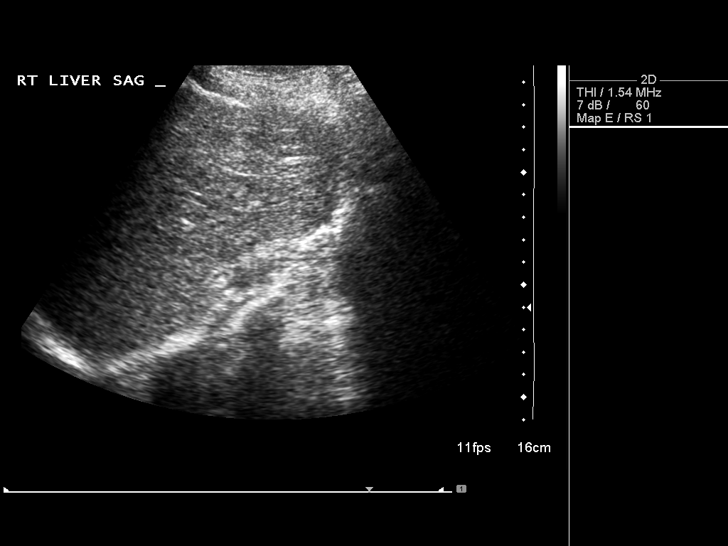
[im 21/31]
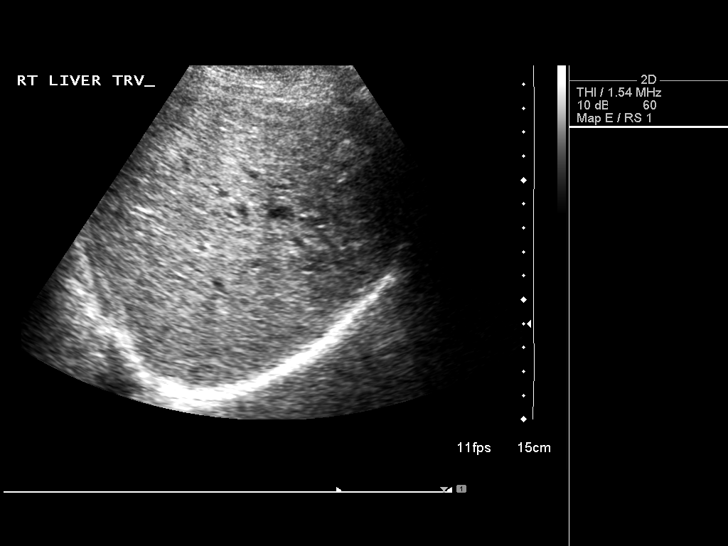
[im 23/31]
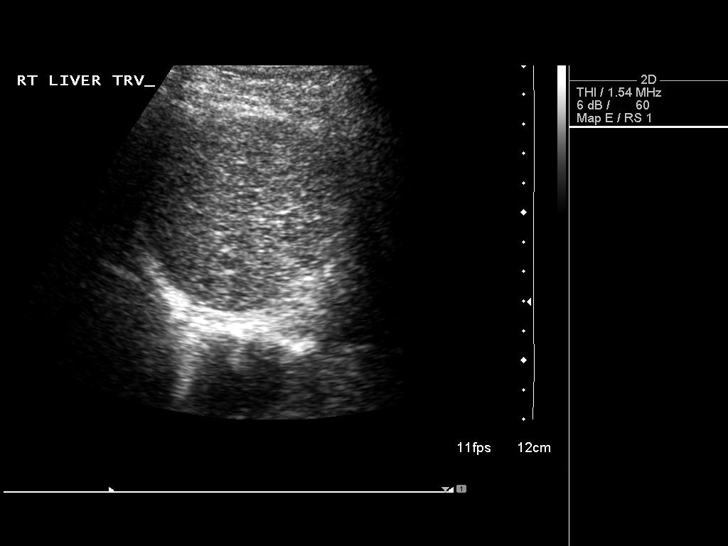
[im 26/31]
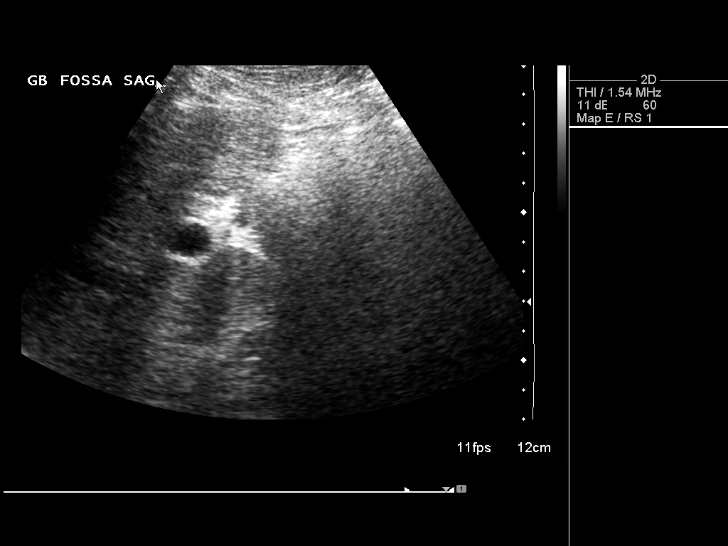
[im 28/31]
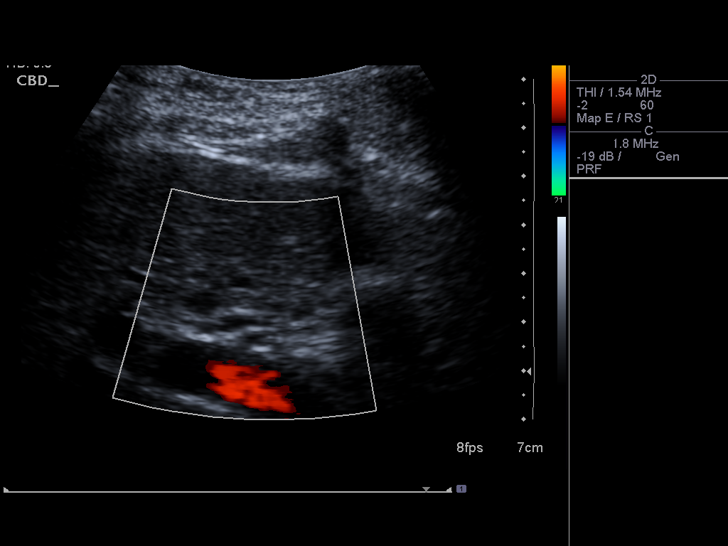
[im 31/31]
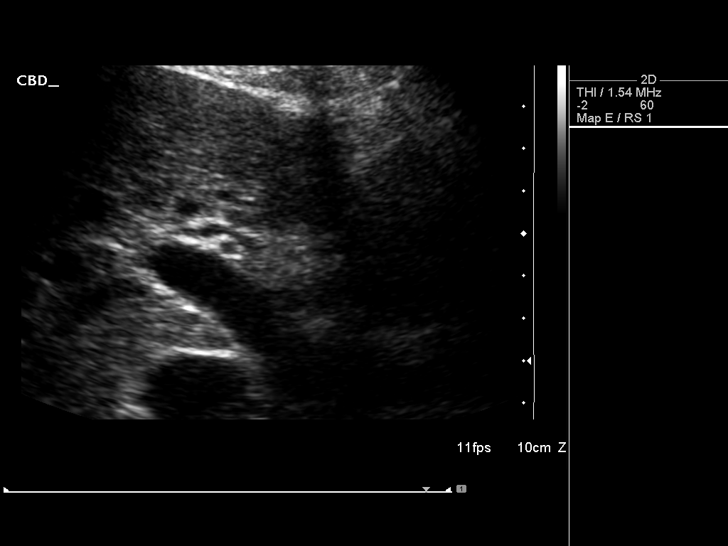

[14 of 25 positions shown; findings below may reference images not displayed]

FINDINGS: Gallbladder:

Surgically absent.

Common bile duct:

Diameter: 3.8 mm.

Liver:

Diffusely eating echogenic and mildly heterogeneous. No mass or free
peritoneal fluid seen.
IMPRESSION: 1. Mildly echogenic and heterogeneous liver, compatible with the
history of chronic hepatitis C and cirrhosis.
2. No ascites or acute abnormality.

## 2016-01-11 ENCOUNTER — Encounter: Payer: Self-pay | Admitting: Gastroenterology

## 2016-03-31 ENCOUNTER — Other Ambulatory Visit: Payer: Self-pay | Admitting: Family Medicine

## 2016-03-31 DIAGNOSIS — B182 Chronic viral hepatitis C: Secondary | ICD-10-CM

## 2016-04-10 ENCOUNTER — Other Ambulatory Visit: Payer: No Typology Code available for payment source

## 2016-04-10 ENCOUNTER — Ambulatory Visit
Admission: RE | Admit: 2016-04-10 | Discharge: 2016-04-10 | Disposition: A | Discharge status: Home / Self Care | Payer: Medicare (Managed Care) | Source: Ambulatory Visit | Attending: Family Medicine | Admitting: Family Medicine

## 2016-04-10 DIAGNOSIS — B182 Chronic viral hepatitis C: Secondary | ICD-10-CM

## 2016-08-13 ENCOUNTER — Inpatient Hospital Stay (HOSPITAL_COMMUNITY)
Admission: EM | Admit: 2016-08-13 | Discharge: 2016-08-16 | DRG: 193 | Disposition: A | Discharge status: Skilled Nursing Facility | Payer: Medicare (Managed Care) | Attending: Internal Medicine | Admitting: Internal Medicine

## 2016-08-13 ENCOUNTER — Encounter (HOSPITAL_COMMUNITY): Payer: Self-pay | Admitting: Emergency Medicine

## 2016-08-13 ENCOUNTER — Observation Stay (HOSPITAL_COMMUNITY): Payer: Medicare (Managed Care)

## 2016-08-13 ENCOUNTER — Emergency Department (HOSPITAL_COMMUNITY): Payer: Medicare (Managed Care)

## 2016-08-13 DIAGNOSIS — R0902 Hypoxemia: Secondary | ICD-10-CM | POA: Diagnosis present

## 2016-08-13 DIAGNOSIS — F0390 Unspecified dementia without behavioral disturbance: Secondary | ICD-10-CM | POA: Diagnosis present

## 2016-08-13 DIAGNOSIS — R509 Fever, unspecified: Secondary | ICD-10-CM | POA: Diagnosis present

## 2016-08-13 DIAGNOSIS — D696 Thrombocytopenia, unspecified: Secondary | ICD-10-CM | POA: Diagnosis present

## 2016-08-13 DIAGNOSIS — G8929 Other chronic pain: Secondary | ICD-10-CM | POA: Diagnosis present

## 2016-08-13 DIAGNOSIS — Z6827 Body mass index (BMI) 27.0-27.9, adult: Secondary | ICD-10-CM

## 2016-08-13 DIAGNOSIS — E876 Hypokalemia: Secondary | ICD-10-CM | POA: Diagnosis present

## 2016-08-13 DIAGNOSIS — Z87442 Personal history of urinary calculi: Secondary | ICD-10-CM

## 2016-08-13 DIAGNOSIS — K7460 Unspecified cirrhosis of liver: Secondary | ICD-10-CM | POA: Diagnosis present

## 2016-08-13 DIAGNOSIS — M549 Dorsalgia, unspecified: Secondary | ICD-10-CM | POA: Diagnosis present

## 2016-08-13 DIAGNOSIS — R52 Pain, unspecified: Secondary | ICD-10-CM

## 2016-08-13 DIAGNOSIS — R0602 Shortness of breath: Secondary | ICD-10-CM

## 2016-08-13 DIAGNOSIS — B182 Chronic viral hepatitis C: Secondary | ICD-10-CM | POA: Diagnosis present

## 2016-08-13 DIAGNOSIS — R4182 Altered mental status, unspecified: Secondary | ICD-10-CM | POA: Diagnosis not present

## 2016-08-13 DIAGNOSIS — M25562 Pain in left knee: Secondary | ICD-10-CM | POA: Diagnosis present

## 2016-08-13 DIAGNOSIS — J189 Pneumonia, unspecified organism: Principal | ICD-10-CM | POA: Diagnosis present

## 2016-08-13 DIAGNOSIS — Z9049 Acquired absence of other specified parts of digestive tract: Secondary | ICD-10-CM

## 2016-08-13 DIAGNOSIS — G9349 Other encephalopathy: Secondary | ICD-10-CM | POA: Diagnosis present

## 2016-08-13 DIAGNOSIS — F0280 Dementia in other diseases classified elsewhere without behavioral disturbance: Secondary | ICD-10-CM | POA: Diagnosis present

## 2016-08-13 DIAGNOSIS — Z833 Family history of diabetes mellitus: Secondary | ICD-10-CM

## 2016-08-13 DIAGNOSIS — I110 Hypertensive heart disease with heart failure: Secondary | ICD-10-CM | POA: Diagnosis present

## 2016-08-13 DIAGNOSIS — R32 Unspecified urinary incontinence: Secondary | ICD-10-CM | POA: Diagnosis present

## 2016-08-13 DIAGNOSIS — Z7984 Long term (current) use of oral hypoglycemic drugs: Secondary | ICD-10-CM

## 2016-08-13 DIAGNOSIS — E119 Type 2 diabetes mellitus without complications: Secondary | ICD-10-CM

## 2016-08-13 DIAGNOSIS — I480 Paroxysmal atrial fibrillation: Secondary | ICD-10-CM

## 2016-08-13 DIAGNOSIS — Z809 Family history of malignant neoplasm, unspecified: Secondary | ICD-10-CM

## 2016-08-13 DIAGNOSIS — Z66 Do not resuscitate: Secondary | ICD-10-CM | POA: Diagnosis present

## 2016-08-13 DIAGNOSIS — H409 Unspecified glaucoma: Secondary | ICD-10-CM | POA: Diagnosis present

## 2016-08-13 DIAGNOSIS — E1165 Type 2 diabetes mellitus with hyperglycemia: Secondary | ICD-10-CM | POA: Diagnosis present

## 2016-08-13 DIAGNOSIS — Y95 Nosocomial condition: Secondary | ICD-10-CM | POA: Diagnosis present

## 2016-08-13 DIAGNOSIS — G9340 Encephalopathy, unspecified: Secondary | ICD-10-CM | POA: Diagnosis present

## 2016-08-13 DIAGNOSIS — E872 Acidosis: Secondary | ICD-10-CM | POA: Diagnosis present

## 2016-08-13 DIAGNOSIS — I10 Essential (primary) hypertension: Secondary | ICD-10-CM | POA: Diagnosis present

## 2016-08-13 DIAGNOSIS — G309 Alzheimer's disease, unspecified: Secondary | ICD-10-CM | POA: Diagnosis present

## 2016-08-13 DIAGNOSIS — E118 Type 2 diabetes mellitus with unspecified complications: Secondary | ICD-10-CM

## 2016-08-13 DIAGNOSIS — Z87891 Personal history of nicotine dependence: Secondary | ICD-10-CM

## 2016-08-13 DIAGNOSIS — F319 Bipolar disorder, unspecified: Secondary | ICD-10-CM | POA: Diagnosis present

## 2016-08-13 DIAGNOSIS — Z79899 Other long term (current) drug therapy: Secondary | ICD-10-CM

## 2016-08-13 DIAGNOSIS — Z823 Family history of stroke: Secondary | ICD-10-CM

## 2016-08-13 DIAGNOSIS — I5032 Chronic diastolic (congestive) heart failure: Secondary | ICD-10-CM | POA: Diagnosis present

## 2016-08-13 DIAGNOSIS — F419 Anxiety disorder, unspecified: Secondary | ICD-10-CM | POA: Diagnosis present

## 2016-08-13 HISTORY — DX: Alzheimer's disease, unspecified: G30.9

## 2016-08-13 HISTORY — DX: Dementia in other diseases classified elsewhere, unspecified severity, without behavioral disturbance, psychotic disturbance, mood disturbance, and anxiety: F02.80

## 2016-08-13 LAB — URINE MICROSCOPIC-ADD ON: Bacteria, UA: NONE SEEN

## 2016-08-13 LAB — URINALYSIS, ROUTINE W REFLEX MICROSCOPIC
Bilirubin Urine: NEGATIVE
Glucose, UA: >1000 mg/dL — AB
Hgb urine dipstick: NEGATIVE
Ketones, ur: 15 mg/dL — AB
Leukocytes, UA: NEGATIVE
Nitrite: NEGATIVE
Protein, ur: NEGATIVE mg/dL
Specific Gravity, Urine: 1.02 (ref 1.005–1.030)
pH: 7 (ref 5.0–8.0)

## 2016-08-13 LAB — PROTIME-INR
INR: 1.24
Prothrombin Time: 15.6 seconds — ABNORMAL HIGH (ref 11.4–15.2)

## 2016-08-13 LAB — CBC WITH DIFFERENTIAL/PLATELET
Basophils Absolute: 0 10*3/uL (ref 0.0–0.1)
Basophils Relative: 0 %
Eosinophils Absolute: 0 10*3/uL (ref 0.0–0.7)
Eosinophils Relative: 0 %
HCT: 40 % (ref 36.0–46.0)
Hemoglobin: 13.3 g/dL (ref 12.0–15.0)
Lymphocytes Relative: 10 %
Lymphs Abs: 0.6 10*3/uL — ABNORMAL LOW (ref 0.7–4.0)
MCH: 28.1 pg (ref 26.0–34.0)
MCHC: 33.3 g/dL (ref 30.0–36.0)
MCV: 84.6 fL (ref 78.0–100.0)
Monocytes Absolute: 0.5 10*3/uL (ref 0.1–1.0)
Monocytes Relative: 8 %
Neutro Abs: 4.9 10*3/uL (ref 1.7–7.7)
Neutrophils Relative %: 81 %
Platelets: 77 10*3/uL — ABNORMAL LOW (ref 150–400)
RBC: 4.73 MIL/uL (ref 3.87–5.11)
RDW: 14.3 % (ref 11.5–15.5)
WBC: 6.1 10*3/uL (ref 4.0–10.5)

## 2016-08-13 LAB — GLUCOSE, CAPILLARY
Glucose-Capillary: 223 mg/dL — ABNORMAL HIGH (ref 65–99)
Glucose-Capillary: 178 mg/dL — ABNORMAL HIGH (ref 65–99)

## 2016-08-13 LAB — COMPREHENSIVE METABOLIC PANEL
ALT: 27 U/L (ref 14–54)
AST: 34 U/L (ref 15–41)
Albumin: 3.2 g/dL — ABNORMAL LOW (ref 3.5–5.0)
Alkaline Phosphatase: 107 U/L (ref 38–126)
Anion gap: 12 (ref 5–15)
BUN: 12 mg/dL (ref 6–20)
CO2: 24 mmol/L (ref 22–32)
Calcium: 9 mg/dL (ref 8.9–10.3)
Chloride: 101 mmol/L (ref 101–111)
Creatinine, Ser: 0.7 mg/dL (ref 0.44–1.00)
GFR calc Af Amer: >60 mL/min (ref >60)
GFR calc non Af Amer: >60 mL/min (ref >60)
Glucose, Bld: 283 mg/dL — ABNORMAL HIGH (ref 65–99)
Potassium: 3.9 mmol/L (ref 3.5–5.1)
Sodium: 137 mmol/L (ref 135–145)
Total Bilirubin: 1.5 mg/dL — ABNORMAL HIGH (ref 0.3–1.2)
Total Protein: 7.3 g/dL (ref 6.5–8.1)

## 2016-08-13 LAB — I-STAT CG4 LACTIC ACID, ED
Lactic Acid, Venous: 2.04 mmol/L (ref 0.5–1.9)
Lactic Acid, Venous: 2.07 mmol/L (ref 0.5–1.9)

## 2016-08-13 LAB — AMMONIA: Ammonia: 32 umol/L (ref 9–35)

## 2016-08-13 LAB — STREP PNEUMONIAE URINARY ANTIGEN: Strep Pneumo Urinary Antigen: NEGATIVE

## 2016-08-13 LAB — VITAMIN B12: Vitamin B-12: 795 pg/mL (ref 180–914)

## 2016-08-13 MED ORDER — NALOXONE HCL 0.4 MG/ML IJ SOLN: 0.4000 mg | INTRAMUSCULAR | Status: DC | PRN

## 2016-08-13 MED ORDER — FUROSEMIDE 10 MG/ML IJ SOLN
40.0000 mg | Freq: Every day | INTRAMUSCULAR | Status: DC
  Administered 2016-08-14: 40 mg via INTRAVENOUS
  Filled 2016-08-13: qty 4

## 2016-08-13 MED ORDER — ONDANSETRON HCL 4 MG/2ML IJ SOLN: 4.0000 mg | Freq: Four times a day (QID) | INTRAMUSCULAR | Status: DC | PRN

## 2016-08-13 MED ORDER — ACETAMINOPHEN 650 MG RE SUPP
650.0000 mg | Freq: Once | RECTAL | Status: AC
  Administered 2016-08-13: 650 mg via RECTAL
  Filled 2016-08-13: qty 1

## 2016-08-13 MED ORDER — SODIUM CHLORIDE 0.9 % IV BOLUS (SEPSIS)
500.0000 mL | Freq: Once | INTRAVENOUS | Status: AC
  Administered 2016-08-13: 500 mL via INTRAVENOUS

## 2016-08-13 MED ORDER — NALOXONE HCL 2 MG/2ML IJ SOSY: 0.2500 mg/h | PREFILLED_SYRINGE | INTRAVENOUS | Status: DC

## 2016-08-13 MED ORDER — VANCOMYCIN HCL IN DEXTROSE 1-5 GM/200ML-% IV SOLN
1000.0000 mg | Freq: Two times a day (BID) | INTRAVENOUS | Status: DC
  Administered 2016-08-14 – 2016-08-15 (×3): 1000 mg via INTRAVENOUS
  Filled 2016-08-13 (×4): qty 200

## 2016-08-13 MED ORDER — METOPROLOL SUCCINATE ER 25 MG PO TB24
25.0000 mg | ORAL_TABLET | Freq: Every day | ORAL | Status: DC
  Administered 2016-08-14 – 2016-08-16 (×3): 25 mg via ORAL
  Filled 2016-08-13 (×3): qty 1

## 2016-08-13 MED ORDER — ACETAMINOPHEN 650 MG RE SUPP: 650.0000 mg | Freq: Four times a day (QID) | RECTAL | Status: DC | PRN

## 2016-08-13 MED ORDER — OLANZAPINE 5 MG PO TABS: 5.0000 mg | ORAL_TABLET | Freq: Two times a day (BID) | ORAL | Status: DC

## 2016-08-13 MED ORDER — DEXTROSE 5 % IV SOLN
1.0000 g | Freq: Three times a day (TID) | INTRAVENOUS | Status: DC
  Administered 2016-08-13 – 2016-08-15 (×5): 1 g via INTRAVENOUS
  Filled 2016-08-13 (×8): qty 1

## 2016-08-13 MED ORDER — ACETAMINOPHEN 325 MG PO TABS: 650.0000 mg | ORAL_TABLET | Freq: Four times a day (QID) | ORAL | Status: DC | PRN

## 2016-08-13 MED ORDER — MAGNESIUM HYDROXIDE 400 MG/5ML PO SUSP: 30.0000 mL | ORAL | Status: DC | PRN

## 2016-08-13 MED ORDER — SODIUM CHLORIDE 0.9 % IV SOLN
INTRAVENOUS | Status: DC
  Administered 2016-08-13: 16:00:00 via INTRAVENOUS

## 2016-08-13 MED ORDER — IBUPROFEN 200 MG PO TABS
400.0000 mg | ORAL_TABLET | Freq: Two times a day (BID) | ORAL | Status: DC
  Administered 2016-08-14 – 2016-08-16 (×5): 400 mg via ORAL
  Filled 2016-08-13 (×6): qty 2

## 2016-08-13 MED ORDER — INSULIN ASPART 100 UNIT/ML ~~LOC~~ SOLN
0.0000 [IU] | Freq: Three times a day (TID) | SUBCUTANEOUS | Status: DC
  Administered 2016-08-13: 3 [IU] via SUBCUTANEOUS
  Administered 2016-08-14: 2 [IU] via SUBCUTANEOUS
  Administered 2016-08-14: 7 [IU] via SUBCUTANEOUS
  Administered 2016-08-14: 2 [IU] via SUBCUTANEOUS
  Administered 2016-08-15: 5 [IU] via SUBCUTANEOUS
  Administered 2016-08-15 (×2): 3 [IU] via SUBCUTANEOUS
  Administered 2016-08-16: 2 [IU] via SUBCUTANEOUS

## 2016-08-13 MED ORDER — TRAZODONE HCL 50 MG PO TABS: 25.0000 mg | ORAL_TABLET | Freq: Every evening | ORAL | Status: DC | PRN

## 2016-08-13 MED ORDER — POTASSIUM CHLORIDE CRYS ER 20 MEQ PO TBCR: 20.0000 meq | EXTENDED_RELEASE_TABLET | Freq: Every day | ORAL | Status: DC

## 2016-08-13 MED ORDER — SODIUM CHLORIDE 0.9 % IV SOLN
500.0000 mg | Freq: Two times a day (BID) | INTRAVENOUS | Status: DC
  Administered 2016-08-13 – 2016-08-16 (×6): 500 mg via INTRAVENOUS
  Filled 2016-08-13 (×7): qty 5

## 2016-08-13 MED ORDER — NALOXONE HCL 0.4 MG/ML IJ SOLN
0.4000 mg | Freq: Once | INTRAMUSCULAR | Status: AC
Start: 2016-08-13 — End: 2016-08-13
  Administered 2016-08-13: 0.4 mg via INTRAVENOUS
  Filled 2016-08-13: qty 1

## 2016-08-13 MED ORDER — VANCOMYCIN HCL 10 G IV SOLR
1250.0000 mg | Freq: Once | INTRAVENOUS | Status: AC
  Administered 2016-08-13: 1250 mg via INTRAVENOUS
  Filled 2016-08-13: qty 1250

## 2016-08-13 MED ORDER — RA SALINE ENEMA 19-7 GM/118ML RE ENEM: ENEMA | RECTAL | Status: DC | PRN

## 2016-08-13 MED ORDER — HYDROCODONE-ACETAMINOPHEN 5-325 MG PO TABS: 1.0000 | ORAL_TABLET | ORAL | Status: DC

## 2016-08-13 MED ORDER — ONDANSETRON HCL 4 MG PO TABS: 4.0000 mg | ORAL_TABLET | Freq: Four times a day (QID) | ORAL | Status: DC | PRN

## 2016-08-13 MED ORDER — CITALOPRAM HYDROBROMIDE 10 MG PO TABS: 20.0000 mg | ORAL_TABLET | Freq: Every day | ORAL | Status: DC

## 2016-08-13 MED ORDER — LORAZEPAM 0.5 MG PO TABS: 0.5000 mg | ORAL_TABLET | Freq: Four times a day (QID) | ORAL | Status: DC | PRN

## 2016-08-13 MED ORDER — INSULIN ASPART 100 UNIT/ML ~~LOC~~ SOLN
0.0000 [IU] | Freq: Every day | SUBCUTANEOUS | Status: DC
  Administered 2016-08-14: 2 [IU] via SUBCUTANEOUS
  Administered 2016-08-15: 3 [IU] via SUBCUTANEOUS

## 2016-08-13 MED ORDER — CARBAMAZEPINE 200 MG PO TABS: 200.0000 mg | ORAL_TABLET | Freq: Two times a day (BID) | ORAL | Status: DC

## 2016-08-13 NOTE — H&P (Signed)
History and Physical    Carol Rubio [id number] DOB: 1946-08-10 DOA: 08/13/2016  PCP: Crisoforo Oxford, PA-C Patient coming from: [facility]  Chief Complaint: acute encephalopathy  HPI: Carol Rubio is a 70 y.o. female with medical history significant for Alzheimer's recently placed him facility for wondering and combativeness, hypertension, diabetes, hepatitis C, cirrhosis of liver, bipolar disorder, thrombocytopenia since to the emergency department from [facility] facility with the chief complaint of altered mental status and fever. Initial evaluation reveals a temperature of 103.2 rectally mild tachycardia mild hypoxia elevated lactic acid and patient is quite lethargic on exam.  Information is obtained from the brother who is at the bedside. He reports patient lived with his sister up until 2 weeks ago when she was admitted to [facility] facility as she became too difficult for her 49 year old sister to manage. She began wandering and developed sundowners and combativeness particularly at night. Brother reports her baseline is alert oriented to self only able to dress self with minimal assistance can feed self once presented with food able to make her wants and needs known and usually continent of bladder and bowel. Yesterday he picked patient up and took her out for 4 hours and she was quite lethargic. She "dozing off" when we were in the car. No complaints of any pain  no lower extremity edema shortness of breath cough. He reports one episode of vomiting. Denies any coffee ground emesis. He states he thought she "felt warm yesterday". He states patient was not eating her normal amount over the last couple of days more episodes of urinary incontinence. He reports the facility increased Olanzapine to twice a day and in his opinion patient has become more lethargic and "slept a lot more" since that time. She has not complained of dysuria hematuria frequency or urgency.    ED Course: In the  emergency department she is afebrile no leukocytosis mild tachycardia mild hypoxia and quite lethargic. She is provided with IV fluids and antibiotics are started.  Review of Systems: As per HPI otherwise 10 point review of systems negative.   Ambulatory Status: Ordinarily she ambulates independently without a cane or walker with a steady gait. No recent falls  Past Medical History:  Diagnosis Date  . Anxiety   . Arthritis    SHOULDER,KNEES,BACK  . Bipolar disorder ([place])    Sees [facility]  . Cataract    had surgery for rt  . Cataract   . Chronic back pain   . Chronic hepatitis C ([place])   . Cirrhosis of liver ([place])   . Dementia   . Depression    Sees [facility]  . Diabetes mellitus   . Encephalopathy 09/2014   [facility]  . Glaucoma   . Hepatitis C virus   . History of kidney stones   . Hypertension   . Joint pain   . Memory loss   . Shortness of breath dyspnea    with exersion  . Thrombocytopenia ([place])   . Urinary incontinence     Past Surgical History:  Procedure Laterality Date  . CATARACT EXTRACTION W/ INTRAOCULAR LENS  IMPLANT, BILATERAL    . CHOLECYSTECTOMY    . EYE SURGERY Bilateral   . POLYPECTOMY      Social History   Social History  . Marital status: Divorced    Spouse name: N/A  . Number of children: 0  . Years of education: 12 years   Occupational History  . Retired    Social History Main  Topics  . Smoking status: Former Smoker    Packs/day: 1.00    Years: 50.00    Types: Cigarettes    Quit date: 12/01/2004  . Smokeless tobacco: Never Used     Comment: 43 pack year history  . Alcohol use No     Comment: hx/o alcohol abuse in 50s and youngerquit 89  . Drug use: No     Comment: history of drug abuse in 47s and younger  . Sexual activity: Not on file   Other Topics Concern  . Not on file   Social History Narrative   Lives with older sister.  No driving since [id number].  Cooks for her self, bathes herself, does  most ADLs. Brother handles her bills, medications.     Right handed   No caffeine use.    Allergies  Allergen Reactions  . Tuberculin Tests Other (See Comments)    Per MAR    Family History  Problem Relation Age of Onset  . Stroke Mother   . Cancer Father   . Diabetes Sister   . Stroke Sister     Prior to Admission medications   Medication Sig Start Date End Date Taking? Authorizing Provider  bisacodyl (DULCOLAX) 10 MG suppository Place 10 mg rectally as needed for moderate constipation.   Yes Historical Provider, MD  carbamazepine (TEGRETOL) 200 MG tablet Take 200 mg by mouth 2 (two) times daily.   Yes Historical Provider, MD  Cholecalciferol (VITAMIN D3) 2000 units TABS Take 2,000 Units by mouth daily.   Yes Historical Provider, MD  citalopram (CELEXA) 20 MG tablet Take 20 mg by mouth daily.   Yes Historical Provider, MD  furosemide (LASIX) 40 MG tablet Take 1 tablet by mouth 2 times daily Patient taking differently: Take 40 mg by mouth once daily 03/09/15  Yes Camelia Eng Tysinger, PA-C  HYDROcodone-acetaminophen (NORCO/VICODIN) 5-325 MG per tablet Take 1 tablet by mouth every 6 (six) hours as needed for moderate pain. Patient taking differently: Take 1 tablet by mouth See admin instructions. Take 1 tablet by mouth daily as needed for pain. Take 1 tablet by mouth at 1700 daily. 02/22/15  Yes Camelia Eng Tysinger, PA-C  ibuprofen (ADVIL,MOTRIN) 400 MG tablet Take 400 mg by mouth 2 (two) times daily.   Yes Historical Provider, MD  lactulose (CHRONULAC) 10 GM/15ML solution Take 45-90 mLs (30-60 g total) by mouth daily as needed for mild constipation (titrate to have 2-3 Bowel movements daily.). Patient taking differently: Take 20 g by mouth 3 (three) times daily.  11/18/14  Yes Camelia Eng Tysinger, PA-C  lisinopril (PRINIVIL,ZESTRIL) 10 MG tablet Take 1 tablet (10 mg total) by mouth daily. Patient taking differently: Take 20 mg by mouth daily.  02/09/15  Yes Camelia Eng Tysinger, PA-C  LORazepam  (ATIVAN) 0.5 MG tablet Take 0.5 mg by mouth every 6 (six) hours as needed (for agitation).   Yes Historical Provider, MD  Magnesium Hydroxide (MILK OF MAGNESIA PO) Take 30 mLs by mouth as needed (for constipation.).   Yes Historical Provider, MD  metformin (FORTAMET) 1000 MG (OSM) 24 hr tablet Take 1,000 mg by mouth 2 (two) times daily.   Yes Historical Provider, MD  metoprolol succinate (TOPROL-XL) 25 MG 24 hr tablet Take 25 mg by mouth daily.   Yes Historical Provider, MD  OLANZapine (ZYPREXA) 5 MG tablet Take 5 mg by mouth 2 (two) times daily.   Yes Historical Provider, MD  OVER THE COUNTER MEDICATION Apply 1 application topically 2 (two) times  daily as needed (forknee pain).   Yes Historical Provider, MD  potassium chloride SA (K-DUR,KLOR-CON) 20 MEQ tablet Take 20 mEq by mouth daily.   Yes Historical Provider, MD  Sodium Phosphates (RA SALINE ENEMA RE) Place 1 each rectally as needed (for constipation).   Yes Historical Provider, MD  amLODipine (NORVASC) 10 MG tablet Take 1 tablet (10 mg total) by mouth daily. Patient not taking: Reported on 08/13/2016 02/09/15   Camelia Eng Tysinger, PA-C  ARIPiprazole (ABILIFY) 10 MG tablet Take 1 tablet (10 mg total) by mouth daily. Patient not taking: Reported on 08/13/2016 11/18/14   Camelia Eng Tysinger, PA-C  carvedilol (COREG) 3.125 MG tablet Take 1 tablet (3.125 mg total) by mouth 2 (two) times daily with a meal. Patient not taking: Reported on 08/13/2016 11/18/14   Camelia Eng Tysinger, PA-C  carvedilol (COREG) 3.125 MG tablet Take 1 tablet by mouth 2 times daily with a meal Patient not taking: Reported on 08/13/2016 03/08/15   Camelia Eng Tysinger, PA-C  latanoprost (XALATAN) 0.005 % ophthalmic solution Place 1 drop into both eyes at bedtime. Patient not taking: Reported on 08/13/2016 02/09/15   Camelia Eng Tysinger, PA-C  metFORMIN (GLUCOPHAGE) 500 MG tablet Take 1 tablet by mouth 2 times daily with food Patient not taking: Reported on 08/13/2016 03/08/15   Camelia Eng Tysinger,  PA-C  PARoxetine (PAXIL) 20 MG tablet TAKE 1 TABLET BY MOUTH EVERY DAY Patient not taking: Reported on 08/13/2016 03/08/15   Camelia Eng Tysinger, PA-C  traZODone (DESYREL) 50 MG tablet TAKE ONE TABLET BY MOUTH AT BEDTIME Patient not taking: Reported on 08/13/2016 03/08/15   Carlena Hurl, PA-C    Physical Exam: Vitals:   08/13/16 1530 08/13/16 1615 08/13/16 1645 08/13/16 1719  BP: 113/60 133/70 128/65   Pulse: 73 70 69   Resp: 22 18    Temp:    101.1 F (38.4 C)  TempSrc:    Rectal  SpO2: 95% 96% 97%      General:  Appears Quite lethargic arouses briefly to vigorous sternal rub  all extremities Eyes:  PERRL, EOMI, normal lids, iris ENT:  grossly normal hearing, lips & tongue, mucous membranes of her mouth are pink slightly dry Neck:  no LAD, masses or thyromegaly Cardiovascular:  RRR, no m/r/g. No LE edema. Pedal pulses present and palpable Respiratory:  Normal effort rest are somewhat shallow intermittent cough during exam poor cough effort rest sounds distant somewhat coarse throughout I hear no wheezes Abdomen:  soft, ntnd, positive bowel sounds throughout no guarding or rebounding Skin:  no rash or induration seen on limited exam Musculoskeletal:  grossly normal tone BUE/BLE, good ROM, no bony abnormality Psychiatric:  grossly normal mood and affect, speech fluent and appropriate, AOx3 Neurologic:  Patient quite lethargic. Will move all extremities to vigorous sternal rub. Attempts to follow commands.  Labs on Admission: I have personally reviewed following labs and imaging studies  CBC:  Recent Labs Lab 08/13/16 1006  WBC 6.1  NEUTROABS 4.9  HGB 13.3  HCT 40.0  MCV 84.6  PLT 77*   Basic Metabolic Panel:  Recent Labs Lab 08/13/16 1006  NA 137  K 3.9  CL 101  CO2 24  GLUCOSE 283*  BUN 12  CREATININE 0.70  CALCIUM 9.0   GFR: CrCl cannot be calculated (Unknown ideal weight.). Liver Function Tests:  Recent Labs Lab 08/13/16 1006  AST 34  ALT 27    ALKPHOS 107  BILITOT 1.5*  PROT 7.3  ALBUMIN 3.2*  No results for input(s): LIPASE, AMYLASE in the last 168 hours.  Recent Labs Lab 08/13/16 1300  AMMONIA 32   Coagulation Profile: No results for input(s): INR, PROTIME in the last 168 hours. Cardiac Enzymes: No results for input(s): CKTOTAL, CKMB, CKMBINDEX, TROPONINI in the last 168 hours. BNP (last 3 results) No results for input(s): PROBNP in the last 8760 hours. HbA1C: No results for input(s): HGBA1C in the last 72 hours. CBG: No results for input(s): GLUCAP in the last 168 hours. Lipid Profile: No results for input(s): CHOL, HDL, LDLCALC, TRIG, CHOLHDL, LDLDIRECT in the last 72 hours. Thyroid Function Tests: No results for input(s): TSH, T4TOTAL, FREET4, T3FREE, THYROIDAB in the last 72 hours. Anemia Panel: No results for input(s): VITAMINB12, FOLATE, FERRITIN, TIBC, IRON, RETICCTPCT in the last 72 hours. Urine analysis:    Component Value Date/Time   COLORURINE AMBER (A) 08/13/2016 1240   APPEARANCEUR CLEAR 08/13/2016 1240   LABSPEC 1.020 08/13/2016 1240   PHURINE 7.0 08/13/2016 1240   GLUCOSEU >1000 (A) 08/13/2016 1240   HGBUR NEGATIVE 08/13/2016 [place] 08/13/2016 1240   BILIRUBINUR neg 02/22/2015 1620   KETONESUR 15 (A) 08/13/2016 1240   PROTEINUR NEGATIVE 08/13/2016 1240   UROBILINOGEN 1.0 02/22/2015 1620   UROBILINOGEN 2.0 (H) 09/08/2014 1601   NITRITE NEGATIVE 08/13/2016 1240   LEUKOCYTESUR NEGATIVE 08/13/2016 1240    Creatinine Clearance: CrCl cannot be calculated (Unknown ideal weight.).  Sepsis Labs: @LABRCNTIP (procalcitonin:4,lacticidven:4) )No results found for this or any previous visit (from the past 240 hour(s)).   Radiological Exams on Admission: Dg Chest Port 1 View  Result Date: 08/13/2016 CLINICAL DATA:  Lethargy with cough. EXAM: PORTABLE CHEST 1 VIEW COMPARISON:  None. FINDINGS: 1009 hours. The cardio pericardial silhouette is enlarged. There is pulmonary  vascular congestion without overt pulmonary edema. Fullness noted in the AP window. The visualized bony structures of the thorax are intact. Telemetry leads overlie the chest. IMPRESSION: Low volumes with vascular congestion. Fullness in the AP window may be related to portable technique and low volumes. Repeat dedicated upright PA and lateral chest x-ray, when the patient is able, is recommended to further evaluate as hilar/mediastinal lesion not excluded. Electronically Signed   By: Misty Stanley M.D.   On: 08/13/2016 10:18    EKG: Pending  Assessment/Plan Principal Problem:   Acute encephalopathy Active Problems:   Essential hypertension   Diabetes mellitus (HCC)   Anxiety   Dementia   Diastolic CHF (HCC)   Thrombocytopenia (HCC)   Chronic back pain   Bipolar affective disorder (HCC)   Fever   Hypoxia   HCAP (healthcare-associated pneumonia)   #1. Acute encephalopathy. Etiology unclear. Concern for infectious process given her fever  and elevated lactic acid and mild hypoxia however urinalysis unremarkable chest x-ray some concern for pneumonia. Recent episodes of emesis in setting of lethargy concerning for aspiration. No metabolic derangements. Patient does have a history of chronic hepatitis ammonia level within the limits of normal. several sedating medications with recent increase in olanzapine. Patient moving all extremities to painful stimuli -Admit to telemetry -Continue IV fluids -Follow blood culture -Sputum culture -Antibiotics per protocol -Track lactic acid -Pneumo urine antigen -CT head for completeness -narcan x1  #2. Healthcare associated pneumonia/mild hypoxia. Patient not on oxygen at home and oxygen saturation level 91% on room air upon presentation. X-ray as noted above. She had an episode of emesis concerning for aspiration. -See #1 -IV fluids per protocol -Strep pneumo urine antigen -Oxygen supplementation as needed  #  3. Hypertension. Control in the  emergency department. Home meds include Lasix, lisinopril, Toprol, amlodipine. Hold these for now -When necessary hydralazine -We'll resume home medications as indicated  #4. Diabetes. Glucose 283 on admission. She is on oral agents at home. -We'll hold oral agents for now -Obtain hemoglobin A1c -Sliding scale for optimal control  #5. Thrombocytopenia. Platelets 77. History of same as well as hepatitis C and cirrhosis. No signs symptoms of active bleeding -Monitor -SCDs  #6. Dementia/Alzheimer's. Patient's baseline is noted above. See #1 -Holding home medications for now  #7. Diastolic HF. Compensated. Home meds include lasix 40mg  bid. Echo 2015 EF [id number] grade 2 diastolic dysfunction.  -daily weight -intake and output -start lasix IV tomorrow -resume home lasix once more alert   DVT prophylaxis: scd  Code Status: dnr  Family Communication: brother at bedside  Disposition Plan: back to facility  Consults called: none  Admission status: obs    Radene Gunning MD [facility]  If 7PM-7AM, please contact night-coverage [email or web address] Password [email or web address]  08/13/2016, 5:45 PM

## 2016-08-13 NOTE — Progress Notes (Signed)
Pharmacy Antibiotic Note  Carol Rubio is a 70 y.o. female admitted from [facility] on 08/[facility]/2016 with fever and AMS.  Pharmacy has been consulted for vancomycin dosing for PNA; also on cefepime. Renal function is normal, est CrCl ~72 ml/min. Using weight ~70 kg until updated weight obtained.  Plan: Vancomycin 1250 mg IV x1 then 1000 mg IV q12h Cefepime 1 g IV q8h per MD Monitor renal function and clinical progress Follow-up weight and adjust dose as needed    Temp (24hrs), Avg:103.2 F (39.6 C), Min:103.2 F (39.6 C), Max:103.2 F (39.6 C)   Recent Labs Lab 08/13/16 1006 08/13/16 1018 08/13/16 1307  WBC 6.1  --   --   CREATININE 0.70  --   --   LATICACIDVEN  --  2.04* 2.07*    CrCl cannot be calculated (Unknown ideal weight.).    Allergies  Allergen Reactions  . Tuberculin Tests Other (See Comments)    Per MAR    Antimicrobials this admission: Vancomycin 10/15 >>  Cefepime 10/15 >>   Dose adjustments this admission:  Microbiology results: 10/15 BCx: collected 10/15 UCx: collected   Thank you for allowing pharmacy to be a part of this patient's care.  Renold Genta, PharmD, BCPS Clinical Pharmacist Phone for today - [place] - [phone] 08/13/2016 2:52 PM

## 2016-08-13 NOTE — ED Provider Notes (Signed)
[name] DEPT Provider Note   CSN: [id number] Arrival date & time: 08/13/16  [id number]     History   Chief Complaint Chief Complaint  Patient presents with  . Altered Mental Status    HPI Carol Rubio is a 70 y.o. female.  HPI Level V caveat applies. Patient presents by EMS from [name] for altered mental status and fever. She is normally alert and talkative. Patient was increasingly lethargic. Had temperature to 103 this morning was given Tylenol. One episode of vomiting prior. Does not normally wear oxygen at home. Noted to be 91% oxygen saturations on room air in the emergency department. Past Medical History:  Diagnosis Date  . Anxiety   . Arthritis    SHOULDER,KNEES,BACK  . Bipolar disorder ([place])    Sees [facility]  . Cataract    had surgery for rt  . Cataract   . Chronic back pain   . Chronic hepatitis C ([place])   . Cirrhosis of liver ([place])   . Dementia   . Depression    Sees [facility]  . Diabetes mellitus   . Encephalopathy 09/2014   [facility]  . Glaucoma   . Hepatitis C virus   . History of kidney stones   . Hypertension   . Joint pain   . Memory loss   . Shortness of breath dyspnea    with exersion  . Thrombocytopenia ([place])   . Urinary incontinence     Patient Active Problem List   Diagnosis Date Noted  . Fever 08/13/2016  . Hypoxia 08/13/2016  . HCAP (healthcare-associated pneumonia) 08/13/2016  . Arthritis of left knee 12/23/2014  . Thrombocytopenia ([place]) 11/18/2014  . History of substance abuse 11/18/2014  . Former smoker 11/18/2014  . Chronic back pain 11/18/2014  . Glaucoma 11/18/2014  . Mixed incontinence 11/18/2014  . Anxiety state 11/18/2014  . Bipolar affective disorder ([place]) 11/18/2014  . History of CHF (congestive heart failure) 11/18/2014  . Encephalopathy, hepatic ([place]) 11/18/2014  . Diabetes type 2, controlled ([place]) 11/18/2014  . Diabetes mellitus ([place]) 09/16/2014  . Anxiety 09/16/2014  .  Depression 09/16/2014  . Dementia 09/16/2014  . Diastolic CHF ([place]) Q000111Q  . Allergic rhinitis 09/16/2014  . Overactive bladder 09/16/2014  . Insomnia 09/16/2014  . Hepatic encephalopathy ([place])   . Chronic hepatitis C without hepatic coma ([place])   . Essential hypertension   . Weakness 09/08/2014  . Acute encephalopathy 09/08/2014    Past Surgical History:  Procedure Laterality Date  . CATARACT EXTRACTION W/ INTRAOCULAR LENS  IMPLANT, BILATERAL    . CHOLECYSTECTOMY    . EYE SURGERY Bilateral   . POLYPECTOMY      OB History    No data available       Home Medications    Prior to Admission medications   Medication Sig Start Date End Date Taking? Authorizing Provider  bisacodyl (DULCOLAX) 10 MG suppository Place 10 mg rectally as needed for moderate constipation.   Yes Historical Provider, MD  carbamazepine (TEGRETOL) 200 MG tablet Take 200 mg by mouth 2 (two) times daily.   Yes Historical Provider, MD  Cholecalciferol (VITAMIN D3) 2000 units TABS Take 2,000 Units by mouth daily.   Yes Historical Provider, MD  citalopram (CELEXA) 20 MG tablet Take 20 mg by mouth daily.   Yes Historical Provider, MD  furosemide (LASIX) 40 MG tablet Take 1 tablet by mouth 2 times daily Patient taking differently: Take 40 mg by mouth once daily 03/09/15  Yes Camelia Eng  Tysinger, PA-C  HYDROcodone-acetaminophen (NORCO/VICODIN) 5-325 MG per tablet Take 1 tablet by mouth every 6 (six) hours as needed for moderate pain. Patient taking differently: Take 1 tablet by mouth See admin instructions. Take 1 tablet by mouth daily as needed for pain. Take 1 tablet by mouth at 1700 daily. 02/22/15  Yes Camelia Eng Tysinger, PA-C  ibuprofen (ADVIL,MOTRIN) 400 MG tablet Take 400 mg by mouth 2 (two) times daily.   Yes Historical Provider, MD  lactulose (CHRONULAC) 10 GM/15ML solution Take 45-90 mLs (30-60 g total) by mouth daily as needed for mild constipation (titrate to have 2-3 Bowel movements daily.). Patient taking  differently: Take 20 g by mouth 3 (three) times daily.  11/18/14  Yes Camelia Eng Tysinger, PA-C  lisinopril (PRINIVIL,ZESTRIL) 10 MG tablet Take 1 tablet (10 mg total) by mouth daily. Patient taking differently: Take 20 mg by mouth daily.  02/09/15  Yes Camelia Eng Tysinger, PA-C  LORazepam (ATIVAN) 0.5 MG tablet Take 0.5 mg by mouth every 6 (six) hours as needed (for agitation).   Yes Historical Provider, MD  Magnesium Hydroxide (MILK OF MAGNESIA PO) Take 30 mLs by mouth as needed (for constipation.).   Yes Historical Provider, MD  metformin (FORTAMET) 1000 MG (OSM) 24 hr tablet Take 1,000 mg by mouth 2 (two) times daily.   Yes Historical Provider, MD  metoprolol succinate (TOPROL-XL) 25 MG 24 hr tablet Take 25 mg by mouth daily.   Yes Historical Provider, MD  OLANZapine (ZYPREXA) 5 MG tablet Take 5 mg by mouth 2 (two) times daily.   Yes Historical Provider, MD  OVER THE COUNTER MEDICATION Apply 1 application topically 2 (two) times daily as needed (forknee pain).   Yes Historical Provider, MD  potassium chloride SA (K-DUR,KLOR-CON) 20 MEQ tablet Take 20 mEq by mouth daily.   Yes Historical Provider, MD  Sodium Phosphates (RA SALINE ENEMA RE) Place 1 each rectally as needed (for constipation).   Yes Historical Provider, MD  amLODipine (NORVASC) 10 MG tablet Take 1 tablet (10 mg total) by mouth daily. Patient not taking: Reported on 08/13/2016 02/09/15   Camelia Eng Tysinger, PA-C  ARIPiprazole (ABILIFY) 10 MG tablet Take 1 tablet (10 mg total) by mouth daily. Patient not taking: Reported on 08/13/2016 11/18/14   Camelia Eng Tysinger, PA-C  carvedilol (COREG) 3.125 MG tablet Take 1 tablet (3.125 mg total) by mouth 2 (two) times daily with a meal. Patient not taking: Reported on 08/13/2016 11/18/14   Camelia Eng Tysinger, PA-C  carvedilol (COREG) 3.125 MG tablet Take 1 tablet by mouth 2 times daily with a meal Patient not taking: Reported on 08/13/2016 03/08/15   Camelia Eng Tysinger, PA-C  latanoprost (XALATAN) 0.005 %  ophthalmic solution Place 1 drop into both eyes at bedtime. Patient not taking: Reported on 08/13/2016 02/09/15   Camelia Eng Tysinger, PA-C  metFORMIN (GLUCOPHAGE) 500 MG tablet Take 1 tablet by mouth 2 times daily with food Patient not taking: Reported on 08/13/2016 03/08/15   Camelia Eng Tysinger, PA-C  PARoxetine (PAXIL) 20 MG tablet TAKE 1 TABLET BY MOUTH EVERY DAY Patient not taking: Reported on 08/13/2016 03/08/15   Camelia Eng Tysinger, PA-C  traZODone (DESYREL) 50 MG tablet TAKE ONE TABLET BY MOUTH AT BEDTIME Patient not taking: Reported on 08/13/2016 03/08/15   Carlena Hurl, PA-C    Family History Family History  Problem Relation Age of Onset  . Stroke Mother   . Cancer Father   . Diabetes Sister   . Stroke Sister  Social History Social History  Substance Use Topics  . Smoking status: Former Smoker    Packs/day: 1.00    Years: 50.00    Types: Cigarettes    Quit date: 12/01/2004  . Smokeless tobacco: Never Used     Comment: 43 pack year history  . Alcohol use No     Comment: hx/o alcohol abuse in 50s and youngerquit 89     Allergies   Tuberculin tests   Review of Systems Review of Systems  Unable to perform ROS: Mental status change     Physical Exam Updated Vital Signs BP 123/60 (BP Location: Left Arm)   Pulse 75   Temp 98.9 F (37.2 C) (Oral)   Resp 20   Ht 5\' 3"  (1.6 m)   Wt 155 lb 8 oz (70.5 kg) Comment: bed scale  SpO2 100%   BMI 27.55 kg/m   Physical Exam  Constitutional: She appears well-developed and well-nourished. No distress.  HENT:  Head: Normocephalic and atraumatic.  Mouth/Throat: Oropharynx is clear and moist.  Eyes: EOM are normal. Pupils are equal, round, and reactive to light.  Neck: Normal range of motion. Neck supple. No JVD present.  No meningismus  Cardiovascular: Normal rate and regular rhythm.   Pulmonary/Chest: Effort normal and breath sounds normal. No respiratory distress. She has no wheezes. She has no rales.  Few scattered  rhonchi  Abdominal: Soft. Bowel sounds are normal. She exhibits no distension and no mass. There is no tenderness. There is no rebound and no guarding. No hernia.  Musculoskeletal: Normal range of motion. She exhibits no edema or tenderness.  No lower extremity swelling, asymmetry or tenderness. Distal pulses are 3+ throughout.  Neurological:  Patient is occasionally saying and compressible words. She is not following commands. She appears to be moving all of her extremities.  Skin: Skin is warm and dry. Capillary refill takes less than 2 seconds. No rash noted. No erythema.  Nursing note and vitals reviewed.    ED Treatments / Results  Labs (all labs ordered are listed, but only abnormal results are displayed) Labs Reviewed  COMPREHENSIVE METABOLIC PANEL - Abnormal; Notable for the following:       Result Value   Glucose, Bld 283 (*)    Albumin 3.2 (*)    Total Bilirubin 1.5 (*)    All other components within normal limits  CBC WITH DIFFERENTIAL/PLATELET - Abnormal; Notable for the following:    Platelets 77 (*)    Lymphs Abs 0.6 (*)    All other components within normal limits  URINALYSIS, ROUTINE W REFLEX MICROSCOPIC (NOT AT [facility]) - Abnormal; Notable for the following:    Color, Urine AMBER (*)    Glucose, UA >1000 (*)    Ketones, ur 15 (*)    All other components within normal limits  URINE MICROSCOPIC-ADD ON - Abnormal; Notable for the following:    Squamous Epithelial / LPF 0-5 (*)    All other components within normal limits  BASIC METABOLIC PANEL - Abnormal; Notable for the following:    Potassium 3.1 (*)    Glucose, Bld 221 (*)    Calcium 8.2 (*)    All other components within normal limits  CBC - Abnormal; Notable for the following:    Platelets 78 (*)    All other components within normal limits  GLUCOSE, CAPILLARY - Abnormal; Notable for the following:    Glucose-Capillary 223 (*)    All other components within normal limits  PROTIME-INR - Abnormal;  Notable for  the following:    Prothrombin Time 15.6 (*)    All other components within normal limits  GLUCOSE, CAPILLARY - Abnormal; Notable for the following:    Glucose-Capillary 178 (*)    All other components within normal limits  GLUCOSE, CAPILLARY - Abnormal; Notable for the following:    Glucose-Capillary 199 (*)    All other components within normal limits  MAGNESIUM - Abnormal; Notable for the following:    Magnesium 1.0 (*)    All other components within normal limits  GLUCOSE, CAPILLARY - Abnormal; Notable for the following:    Glucose-Capillary 329 (*)    All other components within normal limits  I-STAT CG4 LACTIC ACID, ED - Abnormal; Notable for the following:    Lactic Acid, Venous 2.04 (*)    All other components within normal limits  I-STAT CG4 LACTIC ACID, ED - Abnormal; Notable for the following:    Lactic Acid, Venous 2.07 (*)    All other components within normal limits  URINE CULTURE  CULTURE, EXPECTORATED SPUTUM-ASSESSMENT  MRSA PCR SCREENING  CULTURE, BLOOD (ROUTINE X 2)  CULTURE, BLOOD (ROUTINE X 2)  AMMONIA  STREP PNEUMONIAE URINARY ANTIGEN  VITAMIN B12  RPR  HEMOGLOBIN A1C  FOLATE RBC    EKG  EKG Interpretation None       Radiology Ct Head Wo Contrast  Result Date: 08/13/2016 CLINICAL DATA:  Encephalopathy. EXAM: CT HEAD WITHOUT CONTRAST TECHNIQUE: Contiguous axial images were obtained from the base of the skull through the vertex without intravenous contrast. COMPARISON:  09/08/2014 FINDINGS: Brain: No evidence of acute infarction, hemorrhage, hydrocephalus, extra-axial collection or mass lesion/mass effect. Moderate generalized atrophy. Mild chronic microvascular ischemic change in the cerebral white matter. Vascular: Extensive atherosclerotic calcification. Skull: Negative Sinuses/Orbits: Bilateral cataract resection. IMPRESSION: No acute finding.  Senescent changes are stable from 2015. Electronically Signed   By: Monte Fantasia M.D.   On: 08/13/2016  20:23   Dg Chest Port 1 View  Result Date: 08/13/2016 CLINICAL DATA:  Lethargy with cough. EXAM: PORTABLE CHEST 1 VIEW COMPARISON:  None. FINDINGS: 1009 hours. The cardio pericardial silhouette is enlarged. There is pulmonary vascular congestion without overt pulmonary edema. Fullness noted in the AP window. The visualized bony structures of the thorax are intact. Telemetry leads overlie the chest. IMPRESSION: Low volumes with vascular congestion. Fullness in the AP window may be related to portable technique and low volumes. Repeat dedicated upright PA and lateral chest x-ray, when the patient is able, is recommended to further evaluate as hilar/mediastinal lesion not excluded. Electronically Signed   By: Misty Stanley M.D.   On: 08/13/2016 10:18    Procedures Procedures (including critical care time)  Medications Ordered in ED Medications  ibuprofen (ADVIL,MOTRIN) tablet 400 mg (400 mg Oral Given 08/14/16 0958)  magnesium hydroxide (MILK OF MAGNESIA) suspension 30 mL (not administered)  metoprolol succinate (TOPROL-XL) 24 hr tablet 25 mg (25 mg Oral Given 08/14/16 0958)  acetaminophen (TYLENOL) tablet 650 mg (not administered)    Or  acetaminophen (TYLENOL) suppository 650 mg (not administered)  ondansetron (ZOFRAN) tablet 4 mg (not administered)    Or  ondansetron (ZOFRAN) injection 4 mg (not administered)  insulin aspart (novoLOG) injection 0-9 Units (7 Units Subcutaneous Given 08/14/16 1200)  insulin aspart (novoLOG) injection 0-5 Units (0 Units Subcutaneous Not Given 08/13/16 2321)  ceFEPIme (MAXIPIME) 1 g in dextrose 5 % 50 mL IVPB (1 g Intravenous New Bag/Given 08/14/16 1341)  vancomycin (VANCOCIN) IVPB 1000 mg/200 mL premix (1,000  mg Intravenous Given 08/14/16 0327)  levETIRAcetam (KEPPRA) 500 mg in sodium chloride 0.9 % 100 mL IVPB (500 mg Intravenous Given 08/14/16 0958)  furosemide (LASIX) injection 40 mg (40 mg Intravenous Given 08/14/16 0958)  naloxone [facility]) injection 0.4  mg (not administered)  sodium chloride 0.9 % bolus 500 mL (0 mLs Intravenous Stopped 08/13/16 1419)  acetaminophen (TYLENOL) suppository 650 mg (650 mg Rectal Given 08/13/16 1419)  vancomycin (VANCOCIN) 1,250 mg in sodium chloride 0.9 % 250 mL IVPB (1,250 mg Intravenous New Bag/Given 08/13/16 1622)  naloxone [facility]) injection 0.4 mg (0.4 mg Intravenous Given 08/13/16 1821)  sodium chloride 0.9 % bolus 500 mL (500 mLs Intravenous Given 08/14/16 0639)  magnesium sulfate IVPB 4 g 100 mL (4 g Intravenous Given 08/14/16 0846)  potassium chloride SA (K-DUR,KLOR-CON) CR tablet 40 mEq (40 mEq Oral Given 08/14/16 0756)     Initial Impression / Assessment and Plan / ED Course  I have reviewed the triage vital signs and the nursing notes.  Pertinent labs & imaging results that were available during my care of the patient were reviewed by me and considered in my medical decision making (see chart for details).  Clinical Course   Fever of unknown origin. Suspect underlying pneumonia. Started on broad spectrum abx given nursing home patient. Discussed with [facility] and will admit   Final Clinical Impressions(s) / ED Diagnoses   Final diagnoses:  Encephalopathy  SOB (shortness of breath)    New Prescriptions Current Discharge Medication List       Julianne Rice, MD 08/14/16 1401

## 2016-08-13 NOTE — Progress Notes (Addendum)
Pt received from ED with a diagnosis of Acute Encephalopathy, pt is alert to self, when asked her name she said it correctly, besides that pt is confused, oxygen given via [place] at 2l/min, IV fluid continue at 75cc/hr from ED, Because of the pt's cognition status admission process is still incomplete, will try to collect some more data when family members are here, Narcan 0.4mg  given as ordered, Pt's CBG was 223, 3 units insulin provided Called for CT scan of head,  COnsulted with MD regarding pt's situation. Will continue to monitor the patient.

## 2016-08-13 NOTE — ED Notes (Signed)
Patient incontinent of urine.  Bed and patient cleaned, chux replaced.

## 2016-08-13 NOTE — ED Triage Notes (Signed)
Per [facility] patient from [facility] for altered mental status.  EMS reports facility stated patient received 650 mg acetaminophen suppository at 0800 this AM, temp 103F at 0930.   Patient currently warm to touch.  Patient does not wear oxygen at home, 91% on room air.  97% on 2L N/C.

## 2016-08-14 ENCOUNTER — Encounter (HOSPITAL_COMMUNITY): Payer: Self-pay | Admitting: *Deleted

## 2016-08-14 ENCOUNTER — Observation Stay (HOSPITAL_COMMUNITY): Payer: Medicare (Managed Care)

## 2016-08-14 DIAGNOSIS — G309 Alzheimer's disease, unspecified: Secondary | ICD-10-CM | POA: Diagnosis present

## 2016-08-14 DIAGNOSIS — F0280 Dementia in other diseases classified elsewhere without behavioral disturbance: Secondary | ICD-10-CM | POA: Diagnosis present

## 2016-08-14 DIAGNOSIS — Z6827 Body mass index (BMI) 27.0-27.9, adult: Secondary | ICD-10-CM | POA: Diagnosis not present

## 2016-08-14 DIAGNOSIS — F319 Bipolar disorder, unspecified: Secondary | ICD-10-CM | POA: Diagnosis present

## 2016-08-14 DIAGNOSIS — K7460 Unspecified cirrhosis of liver: Secondary | ICD-10-CM | POA: Diagnosis present

## 2016-08-14 DIAGNOSIS — I10 Essential (primary) hypertension: Secondary | ICD-10-CM

## 2016-08-14 DIAGNOSIS — E876 Hypokalemia: Secondary | ICD-10-CM | POA: Diagnosis present

## 2016-08-14 DIAGNOSIS — I110 Hypertensive heart disease with heart failure: Secondary | ICD-10-CM | POA: Diagnosis present

## 2016-08-14 DIAGNOSIS — E1165 Type 2 diabetes mellitus with hyperglycemia: Secondary | ICD-10-CM | POA: Diagnosis present

## 2016-08-14 DIAGNOSIS — R0902 Hypoxemia: Secondary | ICD-10-CM | POA: Diagnosis present

## 2016-08-14 DIAGNOSIS — G9340 Encephalopathy, unspecified: Secondary | ICD-10-CM | POA: Diagnosis not present

## 2016-08-14 DIAGNOSIS — Z87891 Personal history of nicotine dependence: Secondary | ICD-10-CM | POA: Diagnosis not present

## 2016-08-14 DIAGNOSIS — Z9049 Acquired absence of other specified parts of digestive tract: Secondary | ICD-10-CM | POA: Diagnosis not present

## 2016-08-14 DIAGNOSIS — F3170 Bipolar disorder, currently in remission, most recent episode unspecified: Secondary | ICD-10-CM

## 2016-08-14 DIAGNOSIS — R4182 Altered mental status, unspecified: Secondary | ICD-10-CM | POA: Diagnosis present

## 2016-08-14 DIAGNOSIS — Z79899 Other long term (current) drug therapy: Secondary | ICD-10-CM | POA: Diagnosis not present

## 2016-08-14 DIAGNOSIS — B182 Chronic viral hepatitis C: Secondary | ICD-10-CM | POA: Diagnosis present

## 2016-08-14 DIAGNOSIS — F419 Anxiety disorder, unspecified: Secondary | ICD-10-CM | POA: Diagnosis present

## 2016-08-14 DIAGNOSIS — D696 Thrombocytopenia, unspecified: Secondary | ICD-10-CM | POA: Diagnosis present

## 2016-08-14 DIAGNOSIS — R32 Unspecified urinary incontinence: Secondary | ICD-10-CM | POA: Diagnosis present

## 2016-08-14 DIAGNOSIS — Z7984 Long term (current) use of oral hypoglycemic drugs: Secondary | ICD-10-CM | POA: Diagnosis not present

## 2016-08-14 DIAGNOSIS — I480 Paroxysmal atrial fibrillation: Secondary | ICD-10-CM

## 2016-08-14 DIAGNOSIS — Z66 Do not resuscitate: Secondary | ICD-10-CM | POA: Diagnosis present

## 2016-08-14 DIAGNOSIS — E118 Type 2 diabetes mellitus with unspecified complications: Secondary | ICD-10-CM | POA: Diagnosis not present

## 2016-08-14 DIAGNOSIS — J189 Pneumonia, unspecified organism: Secondary | ICD-10-CM | POA: Diagnosis present

## 2016-08-14 DIAGNOSIS — E872 Acidosis: Secondary | ICD-10-CM | POA: Diagnosis present

## 2016-08-14 DIAGNOSIS — Y95 Nosocomial condition: Secondary | ICD-10-CM | POA: Diagnosis present

## 2016-08-14 DIAGNOSIS — G9349 Other encephalopathy: Secondary | ICD-10-CM | POA: Diagnosis present

## 2016-08-14 DIAGNOSIS — I4891 Unspecified atrial fibrillation: Secondary | ICD-10-CM | POA: Diagnosis not present

## 2016-08-14 DIAGNOSIS — R509 Fever, unspecified: Secondary | ICD-10-CM | POA: Diagnosis not present

## 2016-08-14 DIAGNOSIS — F0390 Unspecified dementia without behavioral disturbance: Secondary | ICD-10-CM | POA: Diagnosis not present

## 2016-08-14 DIAGNOSIS — I5032 Chronic diastolic (congestive) heart failure: Secondary | ICD-10-CM | POA: Diagnosis not present

## 2016-08-14 LAB — URINE CULTURE: Culture: NO GROWTH

## 2016-08-14 LAB — CBC
HCT: 37.7 % (ref 36.0–46.0)
Hemoglobin: 12.2 g/dL (ref 12.0–15.0)
MCH: 27.6 pg (ref 26.0–34.0)
MCHC: 32.4 g/dL (ref 30.0–36.0)
MCV: 85.3 fL (ref 78.0–100.0)
Platelets: 78 10*3/uL — ABNORMAL LOW (ref 150–400)
RBC: 4.42 MIL/uL (ref 3.87–5.11)
RDW: 14.4 % (ref 11.5–15.5)
WBC: 8.5 10*3/uL (ref 4.0–10.5)

## 2016-08-14 LAB — EXPECTORATED SPUTUM ASSESSMENT W GRAM STAIN, RFLX TO RESP C

## 2016-08-14 LAB — BASIC METABOLIC PANEL
Anion gap: 9 (ref 5–15)
BUN: 8 mg/dL (ref 6–20)
CO2: 26 mmol/L (ref 22–32)
Calcium: 8.2 mg/dL — ABNORMAL LOW (ref 8.9–10.3)
Chloride: 103 mmol/L (ref 101–111)
Creatinine, Ser: 0.55 mg/dL (ref 0.44–1.00)
GFR calc Af Amer: >60 mL/min (ref >60)
GFR calc non Af Amer: >60 mL/min (ref >60)
Glucose, Bld: 221 mg/dL — ABNORMAL HIGH (ref 65–99)
Potassium: 3.1 mmol/L — ABNORMAL LOW (ref 3.5–5.1)
Sodium: 138 mmol/L (ref 135–145)

## 2016-08-14 LAB — GLUCOSE, CAPILLARY
Glucose-Capillary: 199 mg/dL — ABNORMAL HIGH (ref 65–99)
Glucose-Capillary: 329 mg/dL — ABNORMAL HIGH (ref 65–99)
Glucose-Capillary: 159 mg/dL — ABNORMAL HIGH (ref 65–99)
Glucose-Capillary: 222 mg/dL — ABNORMAL HIGH (ref 65–99)

## 2016-08-14 LAB — MAGNESIUM: Magnesium: 1 mg/dL — ABNORMAL LOW (ref 1.7–2.4)

## 2016-08-14 LAB — MRSA PCR SCREENING: MRSA by PCR: NEGATIVE

## 2016-08-14 LAB — TSH: TSH: 0.568 u[IU]/mL (ref 0.350–4.500)

## 2016-08-14 LAB — RPR: RPR Ser Ql: NONREACTIVE

## 2016-08-14 MED ORDER — SODIUM CHLORIDE 0.9 % IV BOLUS (SEPSIS)
500.0000 mL | Freq: Once | INTRAVENOUS | Status: AC
  Administered 2016-08-14: 500 mL via INTRAVENOUS

## 2016-08-14 MED ORDER — POTASSIUM CHLORIDE 10 MEQ/100ML IV SOLN
10.0000 meq | INTRAVENOUS | Status: DC
  Administered 2016-08-14: 10 meq via INTRAVENOUS
  Filled 2016-08-14 (×2): qty 100

## 2016-08-14 MED ORDER — INSULIN GLARGINE 100 UNIT/ML ~~LOC~~ SOLN
10.0000 [IU] | Freq: Every day | SUBCUTANEOUS | Status: DC
  Administered 2016-08-14: 10 [IU] via SUBCUTANEOUS
  Filled 2016-08-14: qty 0.1

## 2016-08-14 MED ORDER — POTASSIUM CHLORIDE CRYS ER 20 MEQ PO TBCR
40.0000 meq | EXTENDED_RELEASE_TABLET | Freq: Once | ORAL | Status: AC
  Administered 2016-08-14: 40 meq via ORAL
  Filled 2016-08-14: qty 2

## 2016-08-14 MED ORDER — MAGNESIUM SULFATE 4 GM/100ML IV SOLN
4.0000 g | Freq: Once | INTRAVENOUS | Status: AC
  Administered 2016-08-14: 4 g via INTRAVENOUS
  Filled 2016-08-14: qty 100

## 2016-08-14 NOTE — Progress Notes (Signed)
Patient in A-Fib with a Rate of 120s, EKG stat MD Aware and Place Order for IV Medications upon Review.

## 2016-08-14 NOTE — Progress Notes (Addendum)
Inpatient Diabetes Program Recommendations  AACE/ADA: New Consensus Statement on Inpatient Glycemic Control (2015)  Target Ranges:  Prepandial:   less than 140 mg/dL      Peak postprandial:   less than 180 mg/dL (1-2 hours)      Critically ill patients:  140 - 180 mg/dL   Results for Carol Rubio, Carol Rubio (MRN [id number]) as of 08/14/2016 11:58  Ref. Range 08/13/2016 18:10 08/13/2016 21:17 08/14/2016 06:15  Glucose-Capillary Latest Ref Range: 65 - 99 mg/dL 223 (H) 178 (H) 199 (H)  Results for Carol Rubio, Carol Rubio (MRN [id number]) as of 08/14/2016 11:58  Ref. Range 11/25/2014 10:04  Hemoglobin A1C Latest Ref Range: <5.7 % 7.0 (H)    Review of Glycemic Control  Diabetes history: DM2 Outpatient Diabetes medications: Fortamet 1000 mg BID Current orders for Inpatient glycemic control: Novolog 0-9 units TIDAC, 0-5 units QHS  Inpatient Diabetes Program Recommendations: Please consider:  Novolog meal coverage 3 units TID with meals if patient eats = or >50% of meals  Adding basal insulin of Lantus 10 units daily.  Thank you,  Windy Carina, RN, BSN Diabetes Coordinator Inpatient Diabetes Program [phone] (Team Pager)

## 2016-08-14 NOTE — NC FL2 (Signed)
[place] LEVEL OF CARE SCREENING TOOL     IDENTIFICATION  Patient Name: Carol Rubio Birthdate: 04-25-1946 Sex: female Admission Date (Current Location): 08/13/2016  [facility] and [place] Number:  Herbalist and Address:  [facility], [place] [address] [place], [place] [address]      Provider Number: [id number]  Attending Physician Name and Address:  Geradine Girt, DO  Relative Name and Phone Number:       Current Level of Care: Hospital Recommended Level of Care: [place] Prior Approval Number:    Date Approved/Denied:   PASRR Number: [id number]  Discharge Plan: SNF    Current Diagnoses: Patient Active Problem List   Diagnosis Date Noted  . Fever 08/13/2016  . Hypoxia 08/13/2016  . HCAP (healthcare-associated pneumonia) 08/13/2016  . Arthritis of left knee 12/23/2014  . Thrombocytopenia ([place]) 11/18/2014  . History of substance abuse 11/18/2014  . Former smoker 11/18/2014  . Chronic back pain 11/18/2014  . Glaucoma 11/18/2014  . Mixed incontinence 11/18/2014  . Anxiety state 11/18/2014  . Bipolar affective disorder ([place]) 11/18/2014  . History of CHF (congestive heart failure) 11/18/2014  . Encephalopathy, hepatic ([place]) 11/18/2014  . Diabetes type 2, controlled ([place]) 11/18/2014  . Diabetes mellitus ([place]) 09/16/2014  . Anxiety 09/16/2014  . Depression 09/16/2014  . Dementia 09/16/2014  . Diastolic CHF ([place]) Q000111Q  . Allergic rhinitis 09/16/2014  . Overactive bladder 09/16/2014  . Insomnia 09/16/2014  . Hepatic encephalopathy ([place])   . Chronic hepatitis C without hepatic coma ([place])   . Essential hypertension   . Weakness 09/08/2014  . Acute encephalopathy 09/08/2014    Orientation RESPIRATION BLADDER Height & Weight     Self  Normal Continent Weight: 155 lb 8 oz (70.5 kg) (bed scale) Height:  5\' 3"  (160 cm)  BEHAVIORAL SYMPTOMS/MOOD NEUROLOGICAL BOWEL NUTRITION STATUS      Continent  Diet (see DC summary)  AMBULATORY STATUS COMMUNICATION OF NEEDS Skin   Extensive Assist Verbally Normal                       Personal Care Assistance Level of Assistance  Bathing, Dressing Bathing Assistance: Maximum assistance   Dressing Assistance: Maximum assistance     Functional Limitations Info             SPECIAL CARE FACTORS FREQUENCY  PT (By licensed PT), OT (By licensed OT)     PT Frequency: 5/wk OT Frequency: 5/wk            Contractures      Additional Factors Info  Code Status, Allergies, Insulin Sliding Scale Code Status Info: DNR Allergies Info: Tuberculin Tests   Insulin Sliding Scale Info: 5/day       Current Medications (08/14/2016):  This is the current hospital active medication list Current Facility-Administered Medications  Medication Dose Route Frequency Provider Last Rate Last Dose  . acetaminophen (TYLENOL) tablet 650 mg  650 mg Oral Q6H PRN Radene Gunning, NP       Or  . acetaminophen (TYLENOL) suppository 650 mg  650 mg Rectal Q6H PRN Lezlie Octave Black, NP      . ceFEPIme (MAXIPIME) 1 g in dextrose 5 % 50 mL IVPB  1 g Intravenous Q8H Lezlie Octave Black, NP 100 mL/hr at 08/14/16 1341 1 g at 08/14/16 1341  . ibuprofen (ADVIL,MOTRIN) tablet 400 mg  400 mg Oral BID Radene Gunning, NP   400 mg  at 08/14/16 0958  . insulin aspart (novoLOG) injection 0-5 Units  0-5 Units Subcutaneous QHS Lezlie Octave Black, NP      . insulin aspart (novoLOG) injection 0-9 Units  0-9 Units Subcutaneous TID WC Radene Gunning, NP   7 Units at 08/14/16 1200  . insulin glargine (LANTUS) injection 10 Units  10 Units Subcutaneous QHS Jessica U Vann, DO      . levETIRAcetam (KEPPRA) 500 mg in sodium chloride 0.9 % 100 mL IVPB  500 mg Intravenous Q12H Radene Gunning, NP   500 mg at 08/14/16 0958  . magnesium hydroxide (MILK OF MAGNESIA) suspension 30 mL  30 mL Oral PRN Radene Gunning, NP      . metoprolol succinate (TOPROL-XL) 24 hr tablet 25 mg  25 mg Oral Daily Radene Gunning, NP    25 mg at 08/14/16 0958  . naloxone [facility]) injection 0.4 mg  0.4 mg Intravenous PRN Elwin Mocha, MD      . ondansetron [facility]) tablet 4 mg  4 mg Oral Q6H PRN Radene Gunning, NP       Or  . ondansetron [facility]) injection 4 mg  4 mg Intravenous Q6H PRN Radene Gunning, NP      . vancomycin (VANCOCIN) IVPB 1000 mg/200 mL premix  1,000 mg Intravenous Q12H Walsh, RPH   1,000 mg at 08/14/16 0327     Discharge Medications: Please see discharge summary for a list of discharge medications.  Relevant Imaging Results:  Relevant Lab Results:   Additional Information SS#: [id number]  Jorge Ny, LCSW

## 2016-08-14 NOTE — Progress Notes (Signed)
Patient is alert and confused with random words/disorganized loose thoughts, vitals stable, low grade to temp trending down on antibiotics, Congested cough with Thick sputum and choking, suction in use with strong gag reflex.Reported to hold PO medication/ and meals until Speech evaluation d/t choking.

## 2016-08-14 NOTE — Consult Note (Signed)
Cardiology Consultation Note    Patient ID: Carol Rubio, MRN: [id number], DOB/AGE: 07-Mar-1946 70 y.o. Admit date: 08/13/2016   Date of Consult: 08/14/2016 Primary Physician: Crisoforo Oxford, PA-C Primary Cardiologist: New to Dr. Angelena Form  Chief Complaint: lethargy Reason for Consultation: atrial fib Requesting MD: Dr. Eliseo Squires  HPI: Carol Rubio is a 70 y.o. female with history of Alzheimer's disease (recently placed him facility for wandering and combativeness), hypertension, diabetes, hepatitis C, cirrhosis of liver, bipolar disorder, thrombocytopenia, anxiety, arthritis who presented to [facility] with fever, hypoxia, lactic acidosis, lethargy, and acute encephalopathy. Per notes, she lived with family up until recently when she became too difficult for her 54 year old sister to manage. Her olanzapine was recently adjusted. She was brought into the hospital from [facility] due to lethargy and decreased level of consciousness. She had had poor oral intake and an episode of vomiting recently. She was treated with Narcan, IV fluids and IV antibiotics for possible HCAP. CXR 10/16 was reported as c/w CHF with bilateral pulm edema, L>R, bilateral PNA cannot be excluded. Recent fever max was 103.2. Nurse also reports recent fall at her facility. On telemetry she was noted to have paroxysms of rapid atrial fib into the 120s-130s. Home Toprol was continued. Other antihypertensives were discontinued in lieu of decreased oral intake and soft blood pressures. Internal medicine is also working on repleting severe hypokalemia of 3.1 and hypomagnesemia of 1.0. Other pertinent labs include neg strep pneumo uAg, neg RPR, plt of 78k, lactic acid>2, ammonia of 32, INR 1.24. CT head without acute findings. The patient is unable to provide a meaningful history due to her dementia but denies any symptoms at present time. Her nurse reports this is the most awake she's been all day. She is currently in NSR.  Prior EF in 2015  was 60-65%, grade 2 DD.   Past Medical History:  Diagnosis Date  . Alzheimer's dementia    a. placed in facility in 2017 for wandering/combativeness.  . Anxiety   . Arthritis    SHOULDER,KNEES,BACK  . Bipolar disorder ([place])    Sees [facility]  . Cataract    had surgery for rt  . Chronic back pain   . Chronic hepatitis C ([place])   . Cirrhosis of liver ([place])   . Depression    Sees [facility]  . Diabetes mellitus   . Encephalopathy 09/2014   [facility]  . Glaucoma   . Hepatitis C virus   . History of kidney stones   . Hypertension   . Thrombocytopenia ([place])   . Urinary incontinence       Surgical History:  Past Surgical History:  Procedure Laterality Date  . CATARACT EXTRACTION W/ INTRAOCULAR LENS  IMPLANT, BILATERAL    . CHOLECYSTECTOMY    . EYE SURGERY Bilateral   . POLYPECTOMY       Home Meds: Prior to Admission medications   Medication Sig Start Date End Date Taking? Authorizing Provider  bisacodyl (DULCOLAX) 10 MG suppository Place 10 mg rectally as needed for moderate constipation.   Yes Historical Provider, MD  carbamazepine (TEGRETOL) 200 MG tablet Take 200 mg by mouth 2 (two) times daily.   Yes Historical Provider, MD  Cholecalciferol (VITAMIN D3) 2000 units TABS Take 2,000 Units by mouth daily.   Yes Historical Provider, MD  citalopram (CELEXA) 20 MG tablet Take 20 mg by mouth daily.   Yes Historical Provider, MD  furosemide (LASIX) 40 MG tablet Take 1 tablet by mouth 2 times  daily Patient taking differently: Take 40 mg by mouth once daily 03/09/15  Yes Camelia Eng Tysinger, PA-C  HYDROcodone-acetaminophen (NORCO/VICODIN) 5-325 MG per tablet Take 1 tablet by mouth every 6 (six) hours as needed for moderate pain. Patient taking differently: Take 1 tablet by mouth See admin instructions. Take 1 tablet by mouth daily as needed for pain. Take 1 tablet by mouth at 1700 daily. 02/22/15  Yes Camelia Eng Tysinger, PA-C  ibuprofen (ADVIL,MOTRIN) 400 MG  tablet Take 400 mg by mouth 2 (two) times daily.   Yes Historical Provider, MD  lactulose (CHRONULAC) 10 GM/15ML solution Take 45-90 mLs (30-60 g total) by mouth daily as needed for mild constipation (titrate to have 2-3 Bowel movements daily.). Patient taking differently: Take 20 g by mouth 3 (three) times daily.  11/18/14  Yes Camelia Eng Tysinger, PA-C  lisinopril (PRINIVIL,ZESTRIL) 10 MG tablet Take 1 tablet (10 mg total) by mouth daily. Patient taking differently: Take 20 mg by mouth daily.  02/09/15  Yes Camelia Eng Tysinger, PA-C  LORazepam (ATIVAN) 0.5 MG tablet Take 0.5 mg by mouth every 6 (six) hours as needed (for agitation).   Yes Historical Provider, MD  Magnesium Hydroxide (MILK OF MAGNESIA PO) Take 30 mLs by mouth as needed (for constipation.).   Yes Historical Provider, MD  metformin (FORTAMET) 1000 MG (OSM) 24 hr tablet Take 1,000 mg by mouth 2 (two) times daily.   Yes Historical Provider, MD  metoprolol succinate (TOPROL-XL) 25 MG 24 hr tablet Take 25 mg by mouth daily.   Yes Historical Provider, MD  OLANZapine (ZYPREXA) 5 MG tablet Take 5 mg by mouth 2 (two) times daily.   Yes Historical Provider, MD  OVER THE COUNTER MEDICATION Apply 1 application topically 2 (two) times daily as needed (forknee pain).   Yes Historical Provider, MD  potassium chloride SA (K-DUR,KLOR-CON) 20 MEQ tablet Take 20 mEq by mouth daily.   Yes Historical Provider, MD  Sodium Phosphates (RA SALINE ENEMA RE) Place 1 each rectally as needed (for constipation).   Yes Historical Provider, MD  amLODipine (NORVASC) 10 MG tablet Take 1 tablet (10 mg total) by mouth daily. Patient not taking: Reported on 08/13/2016 02/09/15   Camelia Eng Tysinger, PA-C  ARIPiprazole (ABILIFY) 10 MG tablet Take 1 tablet (10 mg total) by mouth daily. Patient not taking: Reported on 08/13/2016 11/18/14   Camelia Eng Tysinger, PA-C  carvedilol (COREG) 3.125 MG tablet Take 1 tablet (3.125 mg total) by mouth 2 (two) times daily with a meal. Patient not  taking: Reported on 08/13/2016 11/18/14   Camelia Eng Tysinger, PA-C  carvedilol (COREG) 3.125 MG tablet Take 1 tablet by mouth 2 times daily with a meal Patient not taking: Reported on 08/13/2016 03/08/15   Camelia Eng Tysinger, PA-C  latanoprost (XALATAN) 0.005 % ophthalmic solution Place 1 drop into both eyes at bedtime. Patient not taking: Reported on 08/13/2016 02/09/15   Camelia Eng Tysinger, PA-C  metFORMIN (GLUCOPHAGE) 500 MG tablet Take 1 tablet by mouth 2 times daily with food Patient not taking: Reported on 08/13/2016 03/08/15   Camelia Eng Tysinger, PA-C  PARoxetine (PAXIL) 20 MG tablet TAKE 1 TABLET BY MOUTH EVERY DAY Patient not taking: Reported on 08/13/2016 03/08/15   Camelia Eng Tysinger, PA-C  traZODone (DESYREL) 50 MG tablet TAKE ONE TABLET BY MOUTH AT BEDTIME Patient not taking: Reported on 08/13/2016 03/08/15   Carlena Hurl, PA-C    Inpatient Medications:  . ceFEPime (MAXIPIME) IV  1 g Intravenous Q8H  .  ibuprofen  400 mg Oral BID  . insulin aspart  0-5 Units Subcutaneous QHS  . insulin aspart  0-9 Units Subcutaneous TID WC  . insulin glargine  10 Units Subcutaneous QHS  . levETIRAcetam  500 mg Intravenous Q12H  . metoprolol succinate  25 mg Oral Daily  . vancomycin  1,000 mg Intravenous Q12H      Allergies:  Allergies  Allergen Reactions  . Tuberculin Tests Other (See Comments)    Per MAR    Social History   Social History  . Marital status: Divorced    Spouse name: N/A  . Number of children: 0  . Years of education: 12 years   Occupational History  . Retired    Social History Main Topics  . Smoking status: Former Smoker    Packs/day: 1.00    Years: 50.00    Types: Cigarettes    Quit date: 12/01/2004  . Smokeless tobacco: Never Used     Comment: 43 pack year history  . Alcohol use No     Comment: hx/o alcohol abuse in 50s and youngerquit 89  . Drug use: No     Comment: history of drug abuse in 33s and younger  . Sexual activity: Not Currently   Other Topics Concern    . Not on file   Social History Narrative   Used to live with older sister, but moved to facility in 2017 due to wandering and combativeness.   No driving since [id number].  Brother handles her bills.   Right handed   No caffeine use.     Family History  Problem Relation Age of Onset  . Stroke Mother   . Cancer Father   . Diabetes Sister   . Stroke Sister      Review of Systems:unable to provide meaningful history given her dementia.   Labs:  Lab Results  Component Value Date   WBC 8.5 08/14/2016   HGB 12.2 08/14/2016   HCT 37.7 08/14/2016   MCV 85.3 08/14/2016   PLT 78 (L) 08/14/2016     Recent Labs Lab 08/13/16 1006 08/14/16 0449  NA 137 138  K 3.9 3.1*  CL 101 103  CO2 24 26  BUN 12 8  CREATININE 0.70 0.55  CALCIUM 9.0 8.2*  PROT 7.3  --   BILITOT 1.5*  --   ALKPHOS 107  --   ALT 27  --   AST 34  --   GLUCOSE 283* 221*   Lab Results  Component Value Date   CHOL 199 05/17/2010   HDL 60 05/17/2010   LDLCALC 118 (H) 05/17/2010   TRIG 103 05/17/2010   No results found for: DDIMER  Radiology/Studies:  Dg Chest 2 View  Result Date: 08/14/2016 CLINICAL DATA:  Shortness of breath. EXAM: CHEST  2 VIEW COMPARISON:  08/13/2016 . FINDINGS: Cardiomegaly with bilateral pulmonary alveolar infiltrates are noted. Findings are consistent with congestive heart failure. Superimposed pneumonia particularly throughout the left lung cannot be excluded. Small bilateral pleural effusions cannot be excluded. No pneumothorax. IMPRESSION: Findings consistent congestive heart failure with bilateral pulmonary edema, left side greater right. Bilateral pneumonia particularly on the left cannot be excluded. Findings have progressed from 08/13/2016. Electronically Signed   By: Marcello Moores  Register   On: 08/14/2016 15:27   Ct Head Wo Contrast  Result Date: 08/13/2016 CLINICAL DATA:  Encephalopathy. EXAM: CT HEAD WITHOUT CONTRAST TECHNIQUE: Contiguous axial images were obtained from the  base of the skull through the vertex without intravenous contrast.  COMPARISON:  09/08/2014 FINDINGS: Brain: No evidence of acute infarction, hemorrhage, hydrocephalus, extra-axial collection or mass lesion/mass effect. Moderate generalized atrophy. Mild chronic microvascular ischemic change in the cerebral white matter. Vascular: Extensive atherosclerotic calcification. Skull: Negative Sinuses/Orbits: Bilateral cataract resection. IMPRESSION: No acute finding.  Senescent changes are stable from 2015. Electronically Signed   By: Monte Fantasia M.D.   On: 08/13/2016 20:23   Dg Chest Port 1 View  Result Date: 08/13/2016 CLINICAL DATA:  Lethargy with cough. EXAM: PORTABLE CHEST 1 VIEW COMPARISON:  None. FINDINGS: 1009 hours. The cardio pericardial silhouette is enlarged. There is pulmonary vascular congestion without overt pulmonary edema. Fullness noted in the AP window. The visualized bony structures of the thorax are intact. Telemetry leads overlie the chest. IMPRESSION: Low volumes with vascular congestion. Fullness in the AP window may be related to portable technique and low volumes. Repeat dedicated upright PA and lateral chest x-ray, when the patient is able, is recommended to further evaluate as hilar/mediastinal lesion not excluded. Electronically Signed   By: Misty Stanley M.D.   On: 08/13/2016 10:18    Wt Readings from Last 3 Encounters:  08/14/16 155 lb 8 oz (70.5 kg)  02/22/15 156 lb (70.8 kg)  01/19/15 169 lb (76.7 kg)    EKG: 08/14/16: atrial fib RVR 117bpm, QTc 469ms, nonspecific ST-T changes  Physical Exam: Blood pressure (!) 97/59, pulse 86, temperature 98.5 F (36.9 C), temperature source Oral, resp. rate 20, height 5\' 3"  (1.6 m), weight 155 lb 8 oz (70.5 kg), SpO2 100 %. Body mass index is 27.55 kg/m. General: Well developed, well nourished, in no acute distress. Head: Normocephalic, atraumatic, no xanthomas, nares are without discharge.  Neck: JVD not elevated. Lungs: Clear  bilaterally to auscultation without wheezes, rales, or rhonchi. Breathing is unlabored. Heart: RRR with S1 S2. No murmurs, rubs, or gallops appreciated. Abdomen: Soft, non-tender, non-distended with normoactive bowel sounds. No hepatomegaly. No rebound/guarding. No obvious abdominal masses. Msk: Generalized atrophy. Extremities: No clubbing or cyanosis. No edema.  Distal pedal pulses are 2+ and equal bilaterally. Neuro: Responds to all questions with the word "no." Psych: Flat affect.     Assessment and Plan  45F with Alzheimer's disease (recently placed him facility for wandering and combativeness), hypertension, diabetes, hepatitis C, cirrhosis of liver, bipolar disorder, thrombocytopenia, anxiety, arthritis who presented to [facility] with fever, hypoxia, lactic acidosis, lethargy, and acute encephalopathy.  1. Fever, acute encephalopathy, and possible HCAP - per internal medicine.   2. Paroxysmal atrial fib - suspect due to underlying medical issues. With dementia, recent fall, and thrombocytopenia she is not felt to be a candidate for anticoagulation at this time. Would continue to manage with rate control with metoprolol as BP tolerates. If AF recurs in setting of low BP, may need to consider amiodarone while allowing her to recover from her acute illness. Echo pending. Add TSH to AM labs.  3. Hypokalemia/hypomagnesemia - being repleted and followed by primary team.  4. Chronic diastolic CHF - CXR raising question of HF vs PNA but clinical picture more c/w PNA. Weight stable compared to last year. F/u BNP in AM. Consider resumption of oral diuretic when PO intake improves.  5. HTN - other home antihypertensives on hold.  Signed, Charlie Pitter PA-C 08/14/2016, 4:47 PM Pager: [phone]  I have personally seen and examined this patient with Melina Copa, PA-C. I agree with the assessment and plan as outlined above. Ms. Jurgens is a pleasant female who presented with lethargy and fevers. She  was  found to have atrial fibrillation. She is now in sinus rhythm. She has had recent progression of her dementia and was recently placed in a facility because of this. My exam today shows a well developed female in NAD. She answers all questions with "No". CV:RRR, no loud murmurs. Lungs: clear bilaterally. Ext: no LE edema. Labs reviewed. Platelets 78,000. EKG with atrial fib with rate 117 bpm. Tele now shows sinus.   She is not a candidate for anti-coagulation given her low platelet count and dementia. I would continue beta blocker at this time. Echo in am. We will follow along with you.   Lauree Chandler 08/14/16 4:48PM

## 2016-08-14 NOTE — Progress Notes (Signed)
PROGRESS NOTE    Carol Rubio  [id number] DOB: December 08, 1945 DOA: 08/13/2016 PCP: Crisoforo Oxford, PA-C   Outpatient Specialists:     Brief Narrative:  Carol Rubio is a 70 y.o. female with medical history significant for Alzheimer's recently placed him facility for wondering and combativeness, hypertension, diabetes, hepatitis C, cirrhosis of liver, bipolar disorder, thrombocytopenia since to the emergency department from [facility] facility with the chief complaint of altered mental status and fever. Initial evaluation reveals a temperature of 103.2 rectally mild tachycardia mild hypoxia elevated lactic acid and patient is quite lethargic on exam.  Information is obtained from the brother who is at the bedside. He reports patient lived with his sister up until 2 weeks ago when Carol Rubio was admitted to [facility] facility as Carol Rubio became too difficult for her 59 year old sister to manage. Carol Rubio began wandering and developed sundowners and combativeness particularly at night. Brother reports her baseline is alert oriented to self only able to dress self with minimal assistance can feed self once presented with food able to make her wants and needs known and usually continent of bladder and bowel. Yesterday he picked patient up and took her out for 4 hours and Carol Rubio was quite lethargic. Carol Rubio "dozing off" when we were in the car. No complaints of any pain  no lower extremity edema shortness of breath cough. He reports one episode of vomiting. Denies any coffee ground emesis. He states he thought Carol Rubio "felt warm yesterday". He states patient was not eating her normal amount over the last couple of days more episodes of urinary incontinence. He reports the facility increased Olanzapine to twice a day and in his opinion patient has become more lethargic and "slept a lot more" since that time. Carol Rubio has not complained of dysuria hematuria frequency or urgency.    Assessment & Plan:   Principal Problem:  Acute encephalopathy Active Problems:   Essential hypertension   Diabetes mellitus (HCC)   Anxiety   Dementia   Diastolic CHF (HCC)   Thrombocytopenia (HCC)   Chronic back pain   Bipolar affective disorder (HCC)   Fever   Hypoxia   HCAP (healthcare-associated pneumonia)  New onset a fib - suspect PNA caused patient to go into a fib-- converted back to sinus CHADVASC2 > 4-- will consult cardiology as low plts and will need anticoagulation -echo -tsh -low Mg- replace and recheck -low K- replace -on BB  Acute encephalopathy. Etiology unclear. Concern for infectious process given her fever  and elevated lactic acid and mild hypoxia- await 2 view x ray -treat infection -if not improved, may need MRI to r/o CVA as new onset a fib  Healthcare associated pneumonia/mild hypoxia. Patient not on oxygen at home and oxygen saturation level 91% on room air upon presentation. -IV abx -check 2 view x ray  Hypertension.  -monitor  Diabetes.  - hemoglobin A1c -Sliding scale for optimal control  Thrombocytopenia. Platelets 77. History of same as well as hepatitis C and cirrhosis. No signs symptoms of active bleeding -Monitor -SCDs  Dementia/Alzheimer's. Patient's baseline is noted above. See #1 -Holding home medications for now   Diastolic HF. Compensated. Home meds include lasix 40mg  bid. Echo 2015 EF [id number] grade 2 diastolic dysfunction.  -daily weight -intake and output -resume home lasix once more alert   DVT prophylaxis:    Code Status: DNR   Family Communication: No family at bedside  Disposition Plan:  Suspect will meet inpt criteria From [facility]   Consultants:   cards  Subjective: Sleepy but will open eyes-- says Carol Rubio is hot  Objective: Vitals:   08/13/16 1719 08/13/16 1800 08/13/16 2037 08/14/16 0347  BP:  133/64 140/68 123/60  Pulse:  78 75 75  Resp:   18 20  Temp: 101.1 F (38.4 C) 99.6 F (37.6 C) 99 F (37.2 C) 98.9 F (37.2 C)    TempSrc: Rectal Oral Oral Oral  SpO2:  100% 100% 100%  Weight:  70.8 kg (156 lb)  70.5 kg (155 lb 8 oz)  Height:  5\' 3"  (1.6 m)      Intake/Output Summary (Last 24 hours) at 08/14/16 1457 Last data filed at 08/14/16 1026  Gross per 24 hour  Intake              565 ml  Output              400 ml  Net              165 ml   Filed Weights   08/13/16 1800 08/14/16 0347  Weight: 70.8 kg (156 lb) 70.5 kg (155 lb 8 oz)    Examination:  General exam: Appears calm and comfortable  Respiratory system: diminished, no wheezing Cardiovascular system: irr Gastrointestinal system: Abdomen is nondistended, soft and nontender. No organomegaly or masses felt. Normal bowel sounds heard. Central nervous system: sleepy    Data Reviewed: I have personally reviewed following labs and imaging studies  CBC:  Recent Labs Lab 08/13/16 1006 08/14/16 0449  WBC 6.1 8.5  NEUTROABS 4.9  --   HGB 13.3 12.2  HCT 40.0 37.7  MCV 84.6 85.3  PLT 77* 78*   Basic Metabolic Panel:  Recent Labs Lab 08/13/16 1006 08/14/16 0449 08/14/16 0655  NA 137 138  --   K 3.9 3.1*  --   CL 101 103  --   CO2 24 26  --   GLUCOSE 283* 221*  --   BUN 12 8  --   CREATININE 0.70 0.55  --   CALCIUM 9.0 8.2*  --   MG  --   --  1.0*   GFR: Estimated Creatinine Clearance: 59.8 mL/min (by C-G formula based on SCr of 0.55 mg/dL). Liver Function Tests:  Recent Labs Lab 08/13/16 1006  AST 34  ALT 27  ALKPHOS 107  BILITOT 1.5*  PROT 7.3  ALBUMIN 3.2*   No results for input(s): LIPASE, AMYLASE in the last 168 hours.  Recent Labs Lab 08/13/16 1300  AMMONIA 32   Coagulation Profile:  Recent Labs Lab 08/13/16 1852  INR 1.24   Cardiac Enzymes: No results for input(s): CKTOTAL, CKMB, CKMBINDEX, TROPONINI in the last 168 hours. BNP (last 3 results) No results for input(s): PROBNP in the last 8760 hours. HbA1C: No results for input(s): HGBA1C in the last 72 hours. CBG:  Recent Labs Lab  08/13/16 1810 08/13/16 2117 08/14/16 0615 08/14/16 1223  GLUCAP 223* 178* 199* 329*   Lipid Profile: No results for input(s): CHOL, HDL, LDLCALC, TRIG, CHOLHDL, LDLDIRECT in the last 72 hours. Thyroid Function Tests: No results for input(s): TSH, T4TOTAL, FREET4, T3FREE, THYROIDAB in the last 72 hours. Anemia Panel:  Recent Labs  08/13/16 2138  VITAMINB12 795   Urine analysis:    Component Value Date/Time   COLORURINE AMBER (A) 08/13/2016 1240   APPEARANCEUR CLEAR 08/13/2016 1240   LABSPEC 1.020 08/13/2016 1240   PHURINE 7.0 08/13/2016 1240   GLUCOSEU >1000 (A) 08/13/2016 1240   HGBUR NEGATIVE 08/13/2016 1240  BILIRUBINUR NEGATIVE 08/13/2016 1240   BILIRUBINUR neg 02/22/2015 1620   KETONESUR 15 (A) 08/13/2016 1240   PROTEINUR NEGATIVE 08/13/2016 1240   UROBILINOGEN 1.0 02/22/2015 1620   UROBILINOGEN 2.0 (H) 09/08/2014 1601   NITRITE NEGATIVE 08/13/2016 1240   LEUKOCYTESUR NEGATIVE 08/13/2016 1240     ) Recent Results (from the past 240 hour(s))  Urine culture     Status: None   Collection Time: 08/13/16 12:40 PM  Result Value Ref Range Status   Specimen Description URINE, RANDOM  Final   Special Requests NONE  Final   Culture NO GROWTH  Final   Report Status 08/14/2016 FINAL  Final  MRSA PCR Screening     Status: None   Collection Time: 08/13/16  8:34 PM  Result Value Ref Range Status   MRSA by PCR NEGATIVE NEGATIVE Final    Comment:        The GeneXpert MRSA Assay (FDA approved for NASAL specimens only), is one component of a comprehensive MRSA colonization surveillance program. It is not intended to diagnose MRSA infection nor to guide or monitor treatment for MRSA infections.   Culture, sputum-assessment     Status: None   Collection Time: 08/14/16  6:36 AM  Result Value Ref Range Status   Specimen Description SPUTUM  Final   Special Requests NONE  Final   Sputum evaluation   Final    MICROSCOPIC FINDINGS SUGGEST THAT THIS SPECIMEN IS NOT  REPRESENTATIVE OF LOWER RESPIRATORY SECRETIONS. PLEASE RECOLLECT. Gram Stain Report Called to,Read Back By and Verified With: B SCHERER,RN AT [id number] 08/14/16 BY L BENFIELD    Report Status 08/14/2016 FINAL  Final      Anti-infectives    Start     Dose/Rate Route Frequency Ordered Stop   08/14/16 0400  vancomycin (VANCOCIN) IVPB 1000 mg/200 mL premix     1,000 mg 200 mL/hr over 60 Minutes Intravenous Every 12 hours 08/13/16 1501     08/13/16 1530  ceFEPIme (MAXIPIME) 1 g in dextrose 5 % 50 mL IVPB     1 g 100 mL/hr over 30 Minutes Intravenous Every 8 hours 08/13/16 1450 08/21/16 1359   08/13/16 1530  vancomycin (VANCOCIN) 1,250 mg in sodium chloride 0.9 % 250 mL IVPB     1,250 mg 166.7 mL/hr over 90 Minutes Intravenous  Once 08/13/16 1457 08/13/16 1752       Radiology Studies: Ct Head Wo Contrast  Result Date: 08/13/2016 CLINICAL DATA:  Encephalopathy. EXAM: CT HEAD WITHOUT CONTRAST TECHNIQUE: Contiguous axial images were obtained from the base of the skull through the vertex without intravenous contrast. COMPARISON:  09/08/2014 FINDINGS: Brain: No evidence of acute infarction, hemorrhage, hydrocephalus, extra-axial collection or mass lesion/mass effect. Moderate generalized atrophy. Mild chronic microvascular ischemic change in the cerebral white matter. Vascular: Extensive atherosclerotic calcification. Skull: Negative Sinuses/Orbits: Bilateral cataract resection. IMPRESSION: No acute finding.  Senescent changes are stable from 2015. Electronically Signed   By: Monte Fantasia M.D.   On: 08/13/2016 20:23   Dg Chest Port 1 View  Result Date: 08/13/2016 CLINICAL DATA:  Lethargy with cough. EXAM: PORTABLE CHEST 1 VIEW COMPARISON:  None. FINDINGS: 1009 hours. The cardio pericardial silhouette is enlarged. There is pulmonary vascular congestion without overt pulmonary edema. Fullness noted in the AP window. The visualized bony structures of the thorax are intact. Telemetry leads overlie the  chest. IMPRESSION: Low volumes with vascular congestion. Fullness in the AP window may be related to portable technique and low volumes. Repeat dedicated upright PA  and lateral chest x-ray, when the patient is able, is recommended to further evaluate as hilar/mediastinal lesion not excluded. Electronically Signed   By: Misty Stanley M.D.   On: 08/13/2016 10:18        Scheduled Meds: . ceFEPime (MAXIPIME) IV  1 g Intravenous Q8H  . furosemide  40 mg Intravenous Daily  . ibuprofen  400 mg Oral BID  . insulin aspart  0-5 Units Subcutaneous QHS  . insulin aspart  0-9 Units Subcutaneous TID WC  . insulin glargine  10 Units Subcutaneous QHS  . levETIRAcetam  500 mg Intravenous Q12H  . metoprolol succinate  25 mg Oral Daily  . vancomycin  1,000 mg Intravenous Q12H   Continuous Infusions:    LOS: 0 days    Time spent: 35 min    [place], DO [facility] Pager [phone]  If 7PM-7AM, please contact night-coverage [email or web address] Password [email or web address] 08/14/2016, 2:57 PM

## 2016-08-14 NOTE — Progress Notes (Addendum)
Pt. Is in and out of sinus tach and -a-fib. Potassium is  To replace in 4 runs along with 500 bolus over 1 hour. Magnesium to add to morning labs

## 2016-08-14 NOTE — Progress Notes (Signed)
SLP Cancellation Note  Patient Details Name: Carol Rubio MRN: [id number] DOB: 1946/05/11   Cancelled treatment:       Reason Eval/Treat Not Completed: Fatigue/lethargy limiting ability to participate   Juan Quam Laurice 08/14/2016, 4:19 PM

## 2016-08-15 ENCOUNTER — Inpatient Hospital Stay (HOSPITAL_COMMUNITY): Payer: Medicare (Managed Care)

## 2016-08-15 DIAGNOSIS — E118 Type 2 diabetes mellitus with unspecified complications: Secondary | ICD-10-CM

## 2016-08-15 DIAGNOSIS — R0902 Hypoxemia: Secondary | ICD-10-CM

## 2016-08-15 DIAGNOSIS — J189 Pneumonia, unspecified organism: Principal | ICD-10-CM

## 2016-08-15 DIAGNOSIS — I4891 Unspecified atrial fibrillation: Secondary | ICD-10-CM

## 2016-08-15 DIAGNOSIS — I480 Paroxysmal atrial fibrillation: Secondary | ICD-10-CM

## 2016-08-15 DIAGNOSIS — I5032 Chronic diastolic (congestive) heart failure: Secondary | ICD-10-CM

## 2016-08-15 LAB — MAGNESIUM: Magnesium: 2 mg/dL (ref 1.7–2.4)

## 2016-08-15 LAB — ECHOCARDIOGRAM COMPLETE
Height: 63 in
Weight: 2531.2 oz

## 2016-08-15 LAB — BASIC METABOLIC PANEL
Anion gap: 8 (ref 5–15)
BUN: 10 mg/dL (ref 6–20)
CO2: 26 mmol/L (ref 22–32)
Calcium: 8.6 mg/dL — ABNORMAL LOW (ref 8.9–10.3)
Chloride: 103 mmol/L (ref 101–111)
Creatinine, Ser: 0.6 mg/dL (ref 0.44–1.00)
GFR calc Af Amer: >60 mL/min (ref >60)
GFR calc non Af Amer: >60 mL/min (ref >60)
Glucose, Bld: 224 mg/dL — ABNORMAL HIGH (ref 65–99)
Potassium: 3.5 mmol/L (ref 3.5–5.1)
Sodium: 137 mmol/L (ref 135–145)

## 2016-08-15 LAB — BRAIN NATRIURETIC PEPTIDE: B Natriuretic Peptide: 154.1 pg/mL — ABNORMAL HIGH (ref 0.0–100.0)

## 2016-08-15 LAB — GLUCOSE, CAPILLARY
Glucose-Capillary: 213 mg/dL — ABNORMAL HIGH (ref 65–99)
Glucose-Capillary: 284 mg/dL — ABNORMAL HIGH (ref 65–99)
Glucose-Capillary: 224 mg/dL — ABNORMAL HIGH (ref 65–99)
Glucose-Capillary: 276 mg/dL — ABNORMAL HIGH (ref 65–99)

## 2016-08-15 LAB — HEMOGLOBIN A1C
Hgb A1c MFr Bld: 11.3 % — ABNORMAL HIGH (ref 4.8–5.6)
Hgb A1c MFr Bld: 11.1 % — ABNORMAL HIGH (ref 4.8–5.6)
Mean Plasma Glucose: 278 mg/dL
Mean Plasma Glucose: 272 mg/dL

## 2016-08-15 LAB — CBC
HCT: 37.9 % (ref 36.0–46.0)
Hemoglobin: 12.5 g/dL (ref 12.0–15.0)
MCH: 27.9 pg (ref 26.0–34.0)
MCHC: 33 g/dL (ref 30.0–36.0)
MCV: 84.6 fL (ref 78.0–100.0)
Platelets: 81 10*3/uL — ABNORMAL LOW (ref 150–400)
RBC: 4.48 MIL/uL (ref 3.87–5.11)
RDW: 14.4 % (ref 11.5–15.5)
WBC: 8.9 10*3/uL (ref 4.0–10.5)

## 2016-08-15 LAB — FOLATE RBC
Folate, Hemolysate: 411.4 ng/mL
Folate, RBC: 1036 ng/mL (ref >498)
Hematocrit: 39.7 % (ref 34.0–46.6)

## 2016-08-15 LAB — TSH: TSH: 0.512 u[IU]/mL (ref 0.350–4.500)

## 2016-08-15 MED ORDER — INSULIN GLARGINE 100 UNIT/ML ~~LOC~~ SOLN
15.0000 [IU] | Freq: Every day | SUBCUTANEOUS | Status: DC
  Administered 2016-08-15: 15 [IU] via SUBCUTANEOUS
  Filled 2016-08-15 (×2): qty 0.15

## 2016-08-15 MED ORDER — FUROSEMIDE 40 MG PO TABS
40.0000 mg | ORAL_TABLET | Freq: Two times a day (BID) | ORAL | Status: DC
  Administered 2016-08-15 – 2016-08-16 (×3): 40 mg via ORAL
  Filled 2016-08-15 (×3): qty 1

## 2016-08-15 MED ORDER — DOXYCYCLINE HYCLATE 100 MG PO TABS
100.0000 mg | ORAL_TABLET | Freq: Two times a day (BID) | ORAL | Status: DC
Start: 2016-08-15 — End: 2016-08-16
  Administered 2016-08-15 – 2016-08-16 (×3): 100 mg via ORAL
  Filled 2016-08-15 (×3): qty 1

## 2016-08-15 MED ORDER — LIDOCAINE 5 % EX PTCH
1.0000 | MEDICATED_PATCH | CUTANEOUS | Status: DC
  Administered 2016-08-15 – 2016-08-16 (×2): 1 via TRANSDERMAL
  Filled 2016-08-15 (×2): qty 1

## 2016-08-15 MED ORDER — POTASSIUM CHLORIDE CRYS ER 20 MEQ PO TBCR
40.0000 meq | EXTENDED_RELEASE_TABLET | Freq: Once | ORAL | Status: AC
  Administered 2016-08-15: 40 meq via ORAL
  Filled 2016-08-15: qty 2

## 2016-08-15 NOTE — Clinical Social Work Note (Signed)
Clinical Social Work Assessment  Patient Details  Name: Carol Rubio MRN: [id number] Date of Birth: 04/07/1946  Date of referral:  08/15/16               Reason for consult:  Facility Placement, Discharge Planning                Permission sought to share information with:  Facility Sport and exercise psychologist, Family Supports Permission granted to share information::  Yes, Verbal Permission Granted  Name::     Carilyn Goodpasture  Agency::  [place]  Relationship::  Brother  Contact Information:  [phone]  Housing/Transportation Living arrangements for the past 2 months:  [place] of Information:  Medical Team, Other (Comment Required) (Brother) Patient Interpreter Needed:  None Criminal Activity/Legal Involvement Pertinent to Current Situation/Hospitalization:  No - Comment as needed Significant Relationships:  Siblings Lives with:  Facility Resident Do you feel safe going back to the place where you live?  Yes Need for family participation in patient care:  Yes (Comment)  Care giving concerns:  Patient is from [facility].   Social Worker assessment / plan:  Patient oriented to only person and time. CSW called patient's brother. CSW introduced role and explained that discharge planning would be discussed. Patient's brother confirmed that patient came from [facility] and the plan is for her to return once stable for discharge. He stated that she would need transport because if she got in the car with him, she would likely get upset wanting to go home with him. CSW called [facility] who stated that she is a patient with [facility] and her social worker is Agricultural engineer. CSW called and left voicemail for [place]. Per MD note, patient will likely discharge tomorrow. No further concerns. CSW encouraged patient's brother to contact CSW as needed. CSW will continue to follow patient for support and facilitate discharge back to SNF once medically  stable.  Employment status:  Retired Forensic scientist:  Medicaid In [place] PT Recommendations:  Not assessed at this time [place] / Referral to community resources:  [place]  Patient/Family's Response to care:  Patient not fully oriented. Patient's brother agreeable to return to SNF. Patient's siblings supportive and involved in patient's care. Patient's brother appreciated social work intervention.  Patient/Family's Understanding of and Emotional Response to Diagnosis, Current Treatment, and Prognosis:  Patient not fully oriented. Patient's brother understands need for return to SNF. Patient's brother appears happy with hospital care.  Emotional Assessment Appearance:  Appears stated age Attitude/Demeanor/Rapport:  Unable to Assess Affect (typically observed):  Unable to Assess Orientation:  Oriented to Self, Oriented to  Time Alcohol / Substance use:  Tobacco Use, Illicit Drugs (History of) Psych involvement (Current and /or in the community):  No (Comment)  Discharge Needs  Concerns to be addressed:  Care Coordination Readmission within the last 30 days:  No Current discharge risk:  Cognitively Impaired, Dependent with Mobility Barriers to Discharge:  No Barriers Identified   Candie Chroman, LCSW 08/15/2016, 12:49 PM

## 2016-08-15 NOTE — Progress Notes (Signed)
Pt has been weaned to room air, per order.

## 2016-08-15 NOTE — Progress Notes (Signed)
PROGRESS NOTE    Carol Rubio  [id number] DOB: 1946-06-05 DOA: 08/13/2016 PCP: Crisoforo Oxford, PA-C   Outpatient Specialists:     Brief Narrative:  Carol Rubio is a 70 y.o. female with medical history significant for Alzheimer's recently placed him facility for wondering and combativeness, hypertension, diabetes, hepatitis C, cirrhosis of liver, bipolar disorder, thrombocytopenia since to the emergency department from [facility] facility with the chief complaint of altered mental status and fever. Initial evaluation reveals a temperature of 103.2 rectally mild tachycardia mild hypoxia elevated lactic acid and patient is quite lethargic on exam.  Information is obtained from the brother who is at the bedside. He reports patient lived with his sister up until 2 weeks ago when she was admitted to [facility] facility as she became too difficult for her 27 year old sister to manage. She began wandering and developed sundowners and combativeness particularly at night. Brother reports her baseline is alert oriented to self only able to dress self with minimal assistance can feed self once presented with food able to make her wants and needs known and usually continent of bladder and bowel. Yesterday he picked patient up and took her out for 4 hours and she was quite lethargic. She "dozing off" when we were in the car. No complaints of any pain  no lower extremity edema shortness of breath cough. He reports one episode of vomiting. Denies any coffee ground emesis. He states he thought she "felt warm yesterday". He states patient was not eating her normal amount over the last couple of days more episodes of urinary incontinence. He reports the facility increased Olanzapine to twice a day and in his opinion patient has become more lethargic and "slept a lot more" since that time. She has not complained of dysuria hematuria frequency or urgency.    Assessment & Plan:   Principal Problem:  Acute encephalopathy Active Problems:   Essential hypertension   Diabetes mellitus (HCC)   Anxiety   Dementia   Chronic diastolic CHF (congestive heart failure) (HCC)   Thrombocytopenia (HCC)   Chronic back pain   Bipolar affective disorder (HCC)   Fever   Hypoxia   HCAP (healthcare-associated pneumonia)   Paroxysmal atrial fibrillation ([place])  New onset a fib - suspect PNA caused patient to go into a fib-- converted back to sinus CHADVASC2 > 4-- appreciate cards-- will hold on anticoagulation as fall risk and low plts -echo- diastolic dysfunction -tsh- ok -low Mg- replaced -low K- replaced -on BB  Acute encephalopathy. Etiology unclear. Concern for infectious process given her fever  and elevated lactic acid and mild hypoxia- await 2 view x ray -treat infection -appears to be at baseline  Healthcare associated pneumonia/mild hypoxia. Patient not on oxygen at home and oxygen saturation level 91% on room air upon presentation. -IV abx- change to PO for total of 5 days  Hypertension.  -monitor  Diabetes.  - hemoglobin A1c: 11.1 -Sliding scale for optimal control  Thrombocytopenia. History of same as well as hepatitis C and cirrhosis. No signs symptoms of active bleeding -Monitor -SCDs  Dementia/Alzheimer's.    Diastolic HF. Compensated. Home meds include lasix 40mg  bid. Echo 2015 EF [id number] grade 2 diastolic dysfunction.  -daily weight -intake and output -resume home lasix  Left knee pain-- no fracture -place lidocaine patch  DVT prophylaxis:    Code Status: DNR   Family Communication: Called brother  Disposition Plan:  From [place]- d/c in AM?   Consultants:   cards     Subjective:  Much more awake and interactive  Objective: Vitals:   08/14/16 0347 08/14/16 1628 08/14/16 2009 08/15/16 0555  BP: 123/60 (!) 97/59 (!) 110/50 124/60  Pulse: 75 86 76 70  Resp: 20  18 18   Temp: 98.9 F (37.2 C) 98.5 F (36.9 C) 98.4 F (36.9 C) 98.8 F  (37.1 C)  TempSrc: Oral Oral Oral Oral  SpO2: 100% 100% 100% 100%  Weight: 70.5 kg (155 lb 8 oz)   71.8 kg (158 lb 3.2 oz)  Height:        Intake/Output Summary (Last 24 hours) at 08/15/16 1219 Last data filed at 08/15/16 1035  Gross per 24 hour  Intake             1775 ml  Output              600 ml  Net             1175 ml   Filed Weights   08/13/16 1800 08/14/16 0347 08/15/16 0555  Weight: 70.8 kg (156 lb) 70.5 kg (155 lb 8 oz) 71.8 kg (158 lb 3.2 oz)    Examination:  General exam: Appears calm and comfortable  Respiratory system: diminished, no wheezing Cardiovascular system: rrr Gastrointestinal system: Abdomen is nondistended, soft and nontender. No organomegaly or masses felt. Normal bowel sounds heard. Central nervous system: much more awake and interactive    Data Reviewed: I have personally reviewed following labs and imaging studies  CBC:  Recent Labs Lab 08/13/16 1006 08/14/16 0449 08/15/16 0313  WBC 6.1 8.5 8.9  NEUTROABS 4.9  --   --   HGB 13.3 12.2 12.5  HCT 40.0 37.7 37.9  MCV 84.6 85.3 84.6  PLT 77* 78* 81*   Basic Metabolic Panel:  Recent Labs Lab 08/13/16 1006 08/14/16 0449 08/14/16 0655 08/15/16 0313  NA 137 138  --  137  K 3.9 3.1*  --  3.5  CL 101 103  --  103  CO2 24 26  --  26  GLUCOSE 283* 221*  --  224*  BUN 12 8  --  10  CREATININE 0.70 0.55  --  0.60  CALCIUM 9.0 8.2*  --  8.6*  MG  --   --  1.0* 2.0   GFR: Estimated Creatinine Clearance: 60.4 mL/min (by C-G formula based on SCr of 0.6 mg/dL). Liver Function Tests:  Recent Labs Lab 08/13/16 1006  AST 34  ALT 27  ALKPHOS 107  BILITOT 1.5*  PROT 7.3  ALBUMIN 3.2*   No results for input(s): LIPASE, AMYLASE in the last 168 hours.  Recent Labs Lab 08/13/16 1300  AMMONIA 32   Coagulation Profile:  Recent Labs Lab 08/13/16 1852  INR 1.24   Cardiac Enzymes: No results for input(s): CKTOTAL, CKMB, CKMBINDEX, TROPONINI in the last 168 hours. BNP (last 3  results) No results for input(s): PROBNP in the last 8760 hours. HbA1C:  Recent Labs  08/13/16 1852 08/14/16 1640  HGBA1C 11.3* 11.1*   CBG:  Recent Labs Lab 08/14/16 0615 08/14/16 1223 08/14/16 1603 08/14/16 2125 08/15/16 0653  GLUCAP 199* 329* 159* 222* 213*   Lipid Profile: No results for input(s): CHOL, HDL, LDLCALC, TRIG, CHOLHDL, LDLDIRECT in the last 72 hours. Thyroid Function Tests:  Recent Labs  08/15/16 0313  TSH 0.512   Anemia Panel:  Recent Labs  08/13/16 2138  VITAMINB12 795   Urine analysis:    Component Value Date/Time   COLORURINE AMBER (A) 08/13/2016 1240   APPEARANCEUR  CLEAR 08/13/2016 1240   LABSPEC 1.020 08/13/2016 1240   PHURINE 7.0 08/13/2016 1240   GLUCOSEU >1000 (A) 08/13/2016 1240   HGBUR NEGATIVE 08/13/2016 [place] 08/13/2016 1240   BILIRUBINUR neg 02/22/2015 1620   KETONESUR 15 (A) 08/13/2016 1240   PROTEINUR NEGATIVE 08/13/2016 1240   UROBILINOGEN 1.0 02/22/2015 1620   UROBILINOGEN 2.0 (H) 09/08/2014 1601   NITRITE NEGATIVE 08/13/2016 [place] 08/13/2016 1240     ) Recent Results (from the past 240 hour(s))  Urine culture     Status: None   Collection Time: 08/13/16 12:40 PM  Result Value Ref Range Status   Specimen Description URINE, RANDOM  Final   Special Requests NONE  Final   Culture NO GROWTH  Final   Report Status 08/14/2016 FINAL  Final  Blood Culture (routine x 2)     Status: None (Preliminary result)   Collection Time: 08/13/16 12:45 PM  Result Value Ref Range Status   Specimen Description BLOOD RIGHT ANTECUBITAL  Final   Special Requests BOTTLES DRAWN AEROBIC AND ANAEROBIC  5CC  Final   Culture NO GROWTH 1 DAY  Final   Report Status PENDING  Incomplete  Blood Culture (routine x 2)     Status: None (Preliminary result)   Collection Time: 08/13/16 12:50 PM  Result Value Ref Range Status   Specimen Description BLOOD LEFT ANTECUBITAL  Final   Special Requests BOTTLES  DRAWN AEROBIC AND ANAEROBIC  5CC  Final   Culture NO GROWTH 1 DAY  Final   Report Status PENDING  Incomplete  MRSA PCR Screening     Status: None   Collection Time: 08/13/16  8:34 PM  Result Value Ref Range Status   MRSA by PCR NEGATIVE NEGATIVE Final    Comment:        The GeneXpert MRSA Assay (FDA approved for NASAL specimens only), is one component of a comprehensive MRSA colonization surveillance program. It is not intended to diagnose MRSA infection nor to guide or monitor treatment for MRSA infections.   Culture, sputum-assessment     Status: None   Collection Time: 08/14/16  6:36 AM  Result Value Ref Range Status   Specimen Description SPUTUM  Final   Special Requests NONE  Final   Sputum evaluation   Final    MICROSCOPIC FINDINGS SUGGEST THAT THIS SPECIMEN IS NOT REPRESENTATIVE OF LOWER RESPIRATORY SECRETIONS. PLEASE RECOLLECT. Gram Stain Report Called to,Read Back By and Verified With: B SCHERER,RN AT [id number] 08/14/16 BY L BENFIELD    Report Status 08/14/2016 FINAL  Final      Anti-infectives    Start     Dose/Rate Route Frequency Ordered Stop   08/15/16 1000  doxycycline (VIBRA-TABS) tablet 100 mg     100 mg Oral Every 12 hours 08/15/16 0816     08/14/16 0400  vancomycin (VANCOCIN) IVPB 1000 mg/200 mL premix  Status:  Discontinued     1,000 mg 200 mL/hr over 60 Minutes Intravenous Every 12 hours 08/13/16 1501 08/15/16 0816   08/13/16 1530  ceFEPIme (MAXIPIME) 1 g in dextrose 5 % 50 mL IVPB  Status:  Discontinued     1 g 100 mL/hr over 30 Minutes Intravenous Every 8 hours 08/13/16 1450 08/15/16 0816   08/13/16 1530  vancomycin (VANCOCIN) 1,250 mg in sodium chloride 0.9 % 250 mL IVPB     1,250 mg 166.7 mL/hr over 90 Minutes Intravenous  Once 08/13/16 1457 08/13/16 1752  Radiology Studies: Dg Chest 2 View  Result Date: 08/14/2016 CLINICAL DATA:  Shortness of breath. EXAM: CHEST  2 VIEW COMPARISON:  08/13/2016 . FINDINGS: Cardiomegaly with bilateral  pulmonary alveolar infiltrates are noted. Findings are consistent with congestive heart failure. Superimposed pneumonia particularly throughout the left lung cannot be excluded. Small bilateral pleural effusions cannot be excluded. No pneumothorax. IMPRESSION: Findings consistent congestive heart failure with bilateral pulmonary edema, left side greater right. Bilateral pneumonia particularly on the left cannot be excluded. Findings have progressed from 08/13/2016. Electronically Signed   By: Marcello Moores  Register   On: 08/14/2016 15:27   Ct Head Wo Contrast  Result Date: 08/13/2016 CLINICAL DATA:  Encephalopathy. EXAM: CT HEAD WITHOUT CONTRAST TECHNIQUE: Contiguous axial images were obtained from the base of the skull through the vertex without intravenous contrast. COMPARISON:  09/08/2014 FINDINGS: Brain: No evidence of acute infarction, hemorrhage, hydrocephalus, extra-axial collection or mass lesion/mass effect. Moderate generalized atrophy. Mild chronic microvascular ischemic change in the cerebral white matter. Vascular: Extensive atherosclerotic calcification. Skull: Negative Sinuses/Orbits: Bilateral cataract resection. IMPRESSION: No acute finding.  Senescent changes are stable from 2015. Electronically Signed   By: Monte Fantasia M.D.   On: 08/13/2016 20:23   Dg Knee Complete 4 Views Left  Result Date: 08/15/2016 CLINICAL DATA:  Pain and swelling.  Fall at home EXAM: LEFT KNEE - COMPLETE 4+ VIEW COMPARISON:  12/18/2014 FINDINGS: Advanced degenerative change in the medial joint compartment with joint space narrowing and spurring. Moderate spurring laterally. Mild moderate degenerate change in the patellofemoral joint. Small joint effusion. Negative for fracture. IMPRESSION: Negative for fracture. Electronically Signed   By: Franchot Gallo M.D.   On: 08/15/2016 09:30        Scheduled Meds: . doxycycline  100 mg Oral Q12H  . furosemide  40 mg Oral BID  . ibuprofen  400 mg Oral BID  . insulin  aspart  0-5 Units Subcutaneous QHS  . insulin aspart  0-9 Units Subcutaneous TID WC  . insulin glargine  15 Units Subcutaneous QHS  . levETIRAcetam  500 mg Intravenous Q12H  . lidocaine  1 patch Transdermal Q24H  . metoprolol succinate  25 mg Oral Daily   Continuous Infusions:    LOS: 1 day    Time spent: 35 min    [place], DO [facility] Pager [phone]  If 7PM-7AM, please contact night-coverage [email or web address] Password [email or web address] 08/15/2016, 12:19 PM

## 2016-08-15 NOTE — Evaluation (Signed)
Clinical/Bedside Swallow Evaluation Patient Details  Name: Cassie Ilg MRN: [id number] Date of Birth: [birth year]  Today's Date: 08/15/2016 Time: SLP Start Time (ACUTE ONLY): 1056 SLP Stop Time (ACUTE ONLY): 1104 SLP Time Calculation (min) (ACUTE ONLY): 8 min  Past Medical History:  Past Medical History:  Diagnosis Date  . Alzheimer's dementia    a. placed in facility in 2017 for wandering/combativeness.  . Anxiety   . Arthritis    SHOULDER,KNEES,BACK  . Bipolar disorder ([place])    Sees [facility]  . Cataract    had surgery for rt  . Chronic back pain   . Chronic hepatitis C ([place])   . Cirrhosis of liver ([place])   . Depression    Sees [facility]  . Diabetes mellitus   . Encephalopathy 09/2014   [facility]  . Glaucoma   . Hepatitis C virus   . History of kidney stones   . Hypertension   . Thrombocytopenia ([place])   . Urinary incontinence    Past Surgical History:  Past Surgical History:  Procedure Laterality Date  . CATARACT EXTRACTION W/ INTRAOCULAR LENS  IMPLANT, BILATERAL    . CHOLECYSTECTOMY    . EYE SURGERY Bilateral   . POLYPECTOMY     HPI:  70 y.o.femalewith medical history significant for Alzheimer's recently placed in facility for wandering and combativeness, hypertension, diabetes, hepatitis C, cirrhosis of liver, bipolar disorder, thrombocytopenia sent to the emergency department from [facility] facility with the chief complaint of altered mental status and fever.  Dx acute encephalopathy, HCAP/mild hypoxia, episode of emesis with concern for aspiration. No hx of dysphagia per chart review.    Assessment / Plan / Recommendation Clinical Impression  Pt's oropharyngeal swallow appears WFL with self-fed trials. No overt s/s of aspiration observed. Suspect that pt's difficulty swallow as documented in RN note on previous date may have been related to mentation at that time. Recommend to continue with current diet. SLP to sign off.     Aspiration Risk  Mild aspiration risk    Diet Recommendation Regular;Thin liquid   Liquid Administration via: Cup;Straw Medication Administration: Whole meds with liquid Supervision: Patient able to self feed;Intermittent supervision to cue for compensatory strategies Compensations: Slow rate;Small sips/bites;Minimize environmental distractions Postural Changes: Seated upright at 90 degrees    Other  Recommendations Oral Care Recommendations: Oral care BID   Follow up Recommendations 24 hour supervision/assistance      Frequency and Duration            Prognosis        Swallow Study   General HPI: 70 y.o.femalewith medical history significant for Alzheimer's recently placed in facility for wandering and combativeness, hypertension, diabetes, hepatitis C, cirrhosis of liver, bipolar disorder, thrombocytopenia sent to the emergency department from [facility] facility with the chief complaint of altered mental status and fever.  Dx acute encephalopathy, HCAP/mild hypoxia, episode of emesis with concern for aspiration. No hx of dysphagia per chart review.  Type of Study: Bedside Swallow Evaluation Previous Swallow Assessment: none in chart Diet Prior to this Study: Regular;Thin liquids Temperature Spikes Noted: No Respiratory Status: Nasal cannula History of Recent Intubation: No Behavior/Cognition: Alert;Cooperative;Pleasant mood Oral Cavity Assessment: Within Functional Limits Oral Care Completed by SLP: No Vision: Functional for self-feeding Self-Feeding Abilities: Able to feed self Patient Positioning: Upright in bed Baseline Vocal Quality: Normal    Oral/Motor/Sensory Function Overall Oral Motor/Sensory Function: Within functional limits   Ice Chips Ice chips: Not tested   Thin Liquid Thin Liquid: Within  functional limits Presentation: Self Fed;Straw    Nectar Thick Nectar Thick Liquid: Not tested   Honey Thick Honey Thick Liquid: Not tested   Puree Puree: Within  functional limits Presentation: Self Fed;Spoon   Solid   GO   Solid: Within functional limits Presentation: Self Suzette Battiest, Mickel Baas 08/15/2016,11:20 AM  Germain Osgood, M.A. CCC-SLP [phone]

## 2016-08-15 NOTE — Progress Notes (Signed)
  Echocardiogram 2D Echocardiogram has been performed.  Carol Rubio 08/15/2016, 9:55 AM

## 2016-08-15 NOTE — Progress Notes (Signed)
     SUBJECTIVE: No chest pain or dyspnea.   Tele: sinus  BP 124/60 (BP Location: Left Arm)   Pulse 70   Temp 98.8 F (37.1 C) (Oral)   Resp 18   Ht 5\' 3"  (1.6 m)   Wt 158 lb 3.2 oz (71.8 kg)   SpO2 100%   BMI 28.02 kg/m   Intake/Output Summary (Last 24 hours) at 08/15/16 [id number] Last data filed at 08/15/16 [id number]  Gross per 24 hour  Intake             1775 ml  Output                0 ml  Net             1775 ml    PHYSICAL EXAM General: Well developed, well nourished, in no acute distress. Awake, alert Psych:  Good affect, responds appropriately. Pleasant Neck: No JVD. No masses noted.  Lungs: Clear bilaterally with no wheezes or rhonci noted.  Heart: RRR with no murmurs noted. Abdomen: Bowel sounds are present. Soft, non-tender.  Extremities: No lower extremity edema.   LABS: Basic Metabolic Panel:  Recent Labs  08/14/16 0449 08/14/16 0655 08/15/16 0313  NA 138  --  137  K 3.1*  --  3.5  CL 103  --  103  CO2 26  --  26  GLUCOSE 221*  --  224*  BUN 8  --  10  CREATININE 0.55  --  0.60  CALCIUM 8.2*  --  8.6*  MG  --  1.0* 2.0   CBC:  Recent Labs  08/13/16 1006 08/14/16 0449 08/15/16 0313  WBC 6.1 8.5 8.9  NEUTROABS 4.9  --   --   HGB 13.3 12.2 12.5  HCT 40.0 37.7 37.9  MCV 84.6 85.3 84.6  PLT 77* 78* 81*    Current Meds: . doxycycline  100 mg Oral Q12H  . ibuprofen  400 mg Oral BID  . insulin aspart  0-5 Units Subcutaneous QHS  . insulin aspart  0-9 Units Subcutaneous TID WC  . insulin glargine  15 Units Subcutaneous QHS  . levETIRAcetam  500 mg Intravenous Q12H  . lidocaine  1 patch Transdermal Q24H  . metoprolol succinate  25 mg Oral Daily  . potassium chloride  40 mEq Oral Once     ASSESSMENT AND PLAN: 70 yo female with advanced Alzheimer's disease (recently placed him facility for wandering and combativeness), hypertension, diabetes, hepatitis C, cirrhosis of liver, bipolar disorder, thrombocytopenia, anxiety, arthritis who presented to  [facility] with fever, hypoxia, lactic acidosis, lethargy, and acute encephalopathy and was found to be in atrial fibrillation.   1. Atrial fibrillation: unknown duration. She is not in sinus. Suspect due to underlying medical issues. With dementia, recent fall, and thrombocytopenia she is not felt to be a candidate for anticoagulation at this time. Would continue metoprolol as BP tolerates. Echo pending today.    2. Chronic diastolic CHF: BNP is slightly elevated but she clinically does not appear to be volume overloaded. Would resume home dose Lasix.     Lauree Chandler  10/17/20179:26 AM

## 2016-08-15 NOTE — Progress Notes (Signed)
Pt confused, removing telemetry leads and box regularly.  Staff having difficulty in keeping telemetry box operating on pt.  Pt is currently NSR, HR 70's.  Notified Dr. Eliseo Squires, requesting consideration to D/C cardiac monitoring, as we cannot keep the leads on her.  Orders to follow.

## 2016-08-16 DIAGNOSIS — F0390 Unspecified dementia without behavioral disturbance: Secondary | ICD-10-CM

## 2016-08-16 DIAGNOSIS — G9340 Encephalopathy, unspecified: Secondary | ICD-10-CM

## 2016-08-16 LAB — GLUCOSE, CAPILLARY: Glucose-Capillary: 164 mg/dL — ABNORMAL HIGH (ref 65–99)

## 2016-08-16 MED ORDER — DOXYCYCLINE HYCLATE 100 MG PO TABS: 100.0000 mg | ORAL_TABLET | Freq: Two times a day (BID) | ORAL | Status: DC

## 2016-08-16 MED ORDER — LIDOCAINE 5 % EX PTCH: 1.0000 | MEDICATED_PATCH | CUTANEOUS | 0 refills | Status: DC

## 2016-08-16 MED ORDER — INSULIN GLARGINE 100 UNIT/ML ~~LOC~~ SOLN: 15.0000 [IU] | Freq: Every day | SUBCUTANEOUS | 11 refills | Status: AC

## 2016-08-16 MED ORDER — FUROSEMIDE 40 MG PO TABS: ORAL_TABLET | ORAL | 5 refills | Status: AC

## 2016-08-16 MED ORDER — OLANZAPINE 5 MG PO TABS: 5.0000 mg | ORAL_TABLET | Freq: Every day | ORAL | Status: AC

## 2016-08-16 MED ORDER — INSULIN ASPART 100 UNIT/ML ~~LOC~~ SOLN: 0.0000 [IU] | Freq: Every day | SUBCUTANEOUS | 11 refills | Status: DC

## 2016-08-16 MED ORDER — INSULIN ASPART 100 UNIT/ML ~~LOC~~ SOLN: 0.0000 [IU] | Freq: Three times a day (TID) | SUBCUTANEOUS | 11 refills | Status: DC

## 2016-08-16 MED ORDER — LORAZEPAM 0.5 MG PO TABS: 0.5000 mg | ORAL_TABLET | Freq: Three times a day (TID) | ORAL | 0 refills | Status: DC | PRN

## 2016-08-16 MED ORDER — ACETAMINOPHEN 325 MG PO TABS: 650.0000 mg | ORAL_TABLET | Freq: Four times a day (QID) | ORAL | Status: DC | PRN

## 2016-08-16 NOTE — Discharge Summary (Signed)
Physician Discharge Summary  [name] [id number] DOB: 1945/12/26 DOA: 08/13/2016  PCP: Crisoforo Oxford, PA-C  Admit date: 08/13/2016 Discharge date: 08/16/2016   Recommendations for Outpatient Follow-Up:   Needs tighter diabetic control Avoid sedating meds/narcotics DNR Doxy through 10/19  Discharge Diagnosis:   Principal Problem:   Acute encephalopathy Active Problems:   Essential hypertension   Diabetes mellitus ([place])   Anxiety   Dementia   Chronic diastolic CHF (congestive heart failure) (HCC)   Thrombocytopenia (HCC)   Chronic back pain   Bipolar affective disorder (HCC)   Fever   Hypoxia   HCAP (healthcare-associated pneumonia)   Paroxysmal atrial fibrillation [facility])   Discharge disposition:  Home  Discharge Condition: Improved.  Diet recommendation: Low sodium, heart healthy.  Carbohydrate-modified  Wound care: None.   History of Present Illness:   Carol Rubio is a 70 y.o. female with medical history significant for Alzheimer's recently placed him facility for wondering and combativeness, hypertension, diabetes, hepatitis C, cirrhosis of liver, bipolar disorder, thrombocytopenia since to the emergency department from [facility] facility with the chief complaint of altered mental status and fever. Initial evaluation reveals a temperature of 103.2 rectally mild tachycardia mild hypoxia elevated lactic acid and patient is quite lethargic on exam.  Information is obtained from the brother who is at the bedside. He reports patient lived with his sister up until 2 weeks ago when she was admitted to [facility] facility as she became too difficult for her 55 year old sister to manage. She began wandering and developed sundowners and combativeness particularly at night. Brother reports her baseline is alert oriented to self only able to dress self with minimal assistance can feed self once presented with food able to make her wants and needs known and  usually continent of bladder and bowel. Yesterday he picked patient up and took her out for 4 hours and she was quite lethargic. She "dozing off" when we were in the car. No complaints of any pain  no lower extremity edema shortness of breath cough. He reports one episode of vomiting. Denies any coffee ground emesis. He states he thought she "felt warm yesterday". He states patient was not eating her normal amount over the last couple of days more episodes of urinary incontinence. He reports the facility increased Olanzapine to twice a day and in his opinion patient has become more lethargic and "slept a lot more" since that time. She has not complained of dysuria hematuria frequency or urgency.   Hospital Course by Problem:   New onset a fib - suspect PNA caused patient to go into a fib-- converted back to sinus CHADVASC2 > 4-- appreciate cards-- will hold on anticoagulation as fall risk and low plts -echo- diastolic dysfunction -tsh- ok -low Mg- replaced -low K- replaced -on BB  Acute encephalopathy due to PNA -treat infection -appears to be at baseline  Healthcare associated pneumonia/mild hypoxia.  -IV abx- change to PO for total of 5 days -off O2  Hypertension.  -monitor  Diabetes- uncontrolled - hemoglobin A1c: 11.1 -lantus and Sliding scale for optimal control-- once better- consider transition to PO agents  Thrombocytopenia. History of same as well as hepatitis C and cirrhosis.No signs symptoms of active bleeding   Dementia/Alzheimer's.  -appears to be at baseline   Diastolic HF. Compensated. Home meds include lasix 40mg  daily. Echo 2015 EF [id number] grade 2 diastolic dysfunction.  -resume home lasix  Left knee pain-- no fracture -place lidocaine patch -avoid systemic medications if possible   Medical  Consultants:    None.   Discharge Exam:   Vitals:   08/15/16 2034 08/16/16 0410  BP: 135/61 123/61  Pulse: 77 62  Resp: 18 20  Temp: 99.8 F (37.7 C)  97.5 F (36.4 C)   Vitals:   08/15/16 0555 08/15/16 1253 08/15/16 2034 08/16/16 0410  BP: 124/60 (!) 151/76 135/61 123/61  Pulse: 70 86 77 62  Resp: 18 18 18 20   Temp: 98.8 F (37.1 C) 98.5 F (36.9 C) 99.8 F (37.7 C) 97.5 F (36.4 C)  TempSrc: Oral Oral Oral Oral  SpO2: 100% 97% 98% 100%  Weight: 71.8 kg (158 lb 3.2 oz)   70.9 kg (156 lb 3.2 oz)  Height:        Gen:  NAD    The results of significant diagnostics from this hospitalization (including imaging, microbiology, ancillary and laboratory) are listed below for reference.     Procedures and Diagnostic Studies:   Dg Chest 2 View  Result Date: 08/14/2016 CLINICAL DATA:  Shortness of breath. EXAM: CHEST  2 VIEW COMPARISON:  08/13/2016 . FINDINGS: Cardiomegaly with bilateral pulmonary alveolar infiltrates are noted. Findings are consistent with congestive heart failure. Superimposed pneumonia particularly throughout the left lung cannot be excluded. Small bilateral pleural effusions cannot be excluded. No pneumothorax. IMPRESSION: Findings consistent congestive heart failure with bilateral pulmonary edema, left side greater right. Bilateral pneumonia particularly on the left cannot be excluded. Findings have progressed from 08/13/2016. Electronically Signed   By: Marcello Moores  Register   On: 08/14/2016 15:27   Ct Head Wo Contrast  Result Date: 08/13/2016 CLINICAL DATA:  Encephalopathy. EXAM: CT HEAD WITHOUT CONTRAST TECHNIQUE: Contiguous axial images were obtained from the base of the skull through the vertex without intravenous contrast. COMPARISON:  09/08/2014 FINDINGS: Brain: No evidence of acute infarction, hemorrhage, hydrocephalus, extra-axial collection or mass lesion/mass effect. Moderate generalized atrophy. Mild chronic microvascular ischemic change in the cerebral white matter. Vascular: Extensive atherosclerotic calcification. Skull: Negative Sinuses/Orbits: Bilateral cataract resection. IMPRESSION: No acute finding.   Senescent changes are stable from 2015. Electronically Signed   By: Monte Fantasia M.D.   On: 08/13/2016 20:23   Dg Chest Port 1 View  Result Date: 08/13/2016 CLINICAL DATA:  Lethargy with cough. EXAM: PORTABLE CHEST 1 VIEW COMPARISON:  None. FINDINGS: 1009 hours. The cardio pericardial silhouette is enlarged. There is pulmonary vascular congestion without overt pulmonary edema. Fullness noted in the AP window. The visualized bony structures of the thorax are intact. Telemetry leads overlie the chest. IMPRESSION: Low volumes with vascular congestion. Fullness in the AP window may be related to portable technique and low volumes. Repeat dedicated upright PA and lateral chest x-ray, when the patient is able, is recommended to further evaluate as hilar/mediastinal lesion not excluded. Electronically Signed   By: Misty Stanley M.D.   On: 08/13/2016 10:18     Labs:   Basic Metabolic Panel:  Recent Labs Lab 08/13/16 1006 08/14/16 0449 08/14/16 0655 08/15/16 0313  NA 137 138  --  137  K 3.9 3.1*  --  3.5  CL 101 103  --  103  CO2 24 26  --  26  GLUCOSE 283* 221*  --  224*  BUN 12 8  --  10  CREATININE 0.70 0.55  --  0.60  CALCIUM 9.0 8.2*  --  8.6*  MG  --   --  1.0* 2.0   GFR Estimated Creatinine Clearance: 60 mL/min (by C-G formula based on SCr of 0.6 mg/dL). Liver  Function Tests:  Recent Labs Lab 08/13/16 1006  AST 34  ALT 27  ALKPHOS 107  BILITOT 1.5*  PROT 7.3  ALBUMIN 3.2*   No results for input(s): LIPASE, AMYLASE in the last 168 hours.  Recent Labs Lab 08/13/16 1300  AMMONIA 32   Coagulation profile  Recent Labs Lab 08/13/16 1852  INR 1.24    CBC:  Recent Labs Lab 08/13/16 1006 08/13/16 2138 08/14/16 0449 08/15/16 0313  WBC 6.1  --  8.5 8.9  NEUTROABS 4.9  --   --   --   HGB 13.3  --  12.2 12.5  HCT 40.0 39.7 37.7 37.9  MCV 84.6  --  85.3 84.6  PLT 77*  --  78* 81*   Cardiac Enzymes: No results for input(s): CKTOTAL, CKMB, CKMBINDEX,  TROPONINI in the last 168 hours. BNP: Invalid input(s): POCBNP CBG:  Recent Labs Lab 08/15/16 0653 08/15/16 1343 08/15/16 1725 08/15/16 2046 08/16/16 0640  GLUCAP 213* 284* 224* 276* 164*   D-Dimer No results for input(s): DDIMER in the last 72 hours. Hgb A1c  Recent Labs  08/13/16 1852 08/14/16 1640  HGBA1C 11.3* 11.1*   Lipid Profile No results for input(s): CHOL, HDL, LDLCALC, TRIG, CHOLHDL, LDLDIRECT in the last 72 hours. Thyroid function studies  Recent Labs  08/15/16 0313  TSH 0.512   Anemia work up  Recent Labs  08/13/16 2138  VITAMINB12 795   Microbiology Recent Results (from the past 240 hour(s))  Urine culture     Status: None   Collection Time: 08/13/16 12:40 PM  Result Value Ref Range Status   Specimen Description URINE, RANDOM  Final   Special Requests NONE  Final   Culture NO GROWTH  Final   Report Status 08/14/2016 FINAL  Final  Blood Culture (routine x 2)     Status: None (Preliminary result)   Collection Time: 08/13/16 12:45 PM  Result Value Ref Range Status   Specimen Description BLOOD RIGHT ANTECUBITAL  Final   Special Requests BOTTLES DRAWN AEROBIC AND ANAEROBIC  5CC  Final   Culture NO GROWTH 2 DAYS  Final   Report Status PENDING  Incomplete  Blood Culture (routine x 2)     Status: None (Preliminary result)   Collection Time: 08/13/16 12:50 PM  Result Value Ref Range Status   Specimen Description BLOOD LEFT ANTECUBITAL  Final   Special Requests BOTTLES DRAWN AEROBIC AND ANAEROBIC  5CC  Final   Culture NO GROWTH 2 DAYS  Final   Report Status PENDING  Incomplete  MRSA PCR Screening     Status: None   Collection Time: 08/13/16  8:34 PM  Result Value Ref Range Status   MRSA by PCR NEGATIVE NEGATIVE Final    Comment:        The GeneXpert MRSA Assay (FDA approved for NASAL specimens only), is one component of a comprehensive MRSA colonization surveillance program. It is not intended to diagnose MRSA infection nor to guide  or monitor treatment for MRSA infections.   Culture, sputum-assessment     Status: None   Collection Time: 08/14/16  6:36 AM  Result Value Ref Range Status   Specimen Description SPUTUM  Final   Special Requests NONE  Final   Sputum evaluation   Final    MICROSCOPIC FINDINGS SUGGEST THAT THIS SPECIMEN IS NOT REPRESENTATIVE OF LOWER RESPIRATORY SECRETIONS. PLEASE RECOLLECT. Gram Stain Report Called to,Read Back By and Verified With: B SCHERER,RN AT 0841 08/14/16 BY L BENFIELD  Report Status 08/14/2016 FINAL  Final     Discharge Instructions:   Discharge Instructions    Diet - low sodium heart healthy    Complete by:  As directed    Diet Carb Modified    Complete by:  As directed    Discharge instructions    Complete by:  As directed    Avoid narcotics/sedating meds Needs better diabetic control-- insulin started as well as SSI-- consider change to PO options once blood sugars stable DNR   Increase activity slowly    Complete by:  As directed        Medication List    STOP taking these medications   amLODipine 10 MG tablet Commonly known as:  NORVASC   ARIPiprazole 10 MG tablet Commonly known as:  ABILIFY   carvedilol 3.125 MG tablet Commonly known as:  COREG   HYDROcodone-acetaminophen 5-325 MG tablet Commonly known as:  NORCO/VICODIN   latanoprost 0.005 % ophthalmic solution Commonly known as:  XALATAN   OVER THE COUNTER MEDICATION   PARoxetine 20 MG tablet Commonly known as:  PAXIL   RA SALINE ENEMA RE   traZODone 50 MG tablet Commonly known as:  DESYREL     TAKE these medications   acetaminophen 325 MG tablet Commonly known as:  TYLENOL Take 2 tablets (650 mg total) by mouth every 6 (six) hours as needed for mild pain (or Fever >/= 101).   bisacodyl 10 MG suppository Commonly known as:  DULCOLAX Place 10 mg rectally as needed for moderate constipation.   carbamazepine 200 MG tablet Commonly known as:  TEGRETOL Take 200 mg by mouth 2 (two)  times daily.   citalopram 20 MG tablet Commonly known as:  CELEXA Take 20 mg by mouth daily.   doxycycline 100 MG tablet Commonly known as:  VIBRA-TABS Take 1 tablet (100 mg total) by mouth every 12 (twelve) hours.   furosemide 40 MG tablet Commonly known as:  LASIX Take 40 mg by mouth once daily What changed:  See the new instructions.   ibuprofen 400 MG tablet Commonly known as:  ADVIL,MOTRIN Take 400 mg by mouth 2 (two) times daily.   insulin aspart 100 UNIT/ML injection Commonly known as:  novoLOG Inject 0-5 Units into the skin at bedtime.   insulin aspart 100 UNIT/ML injection Commonly known as:  novoLOG Inject 0-9 Units into the skin 3 (three) times daily with meals.   insulin glargine 100 UNIT/ML injection Commonly known as:  LANTUS Inject 0.15 mLs (15 Units total) into the skin at bedtime.   lactulose 10 GM/15ML solution Commonly known as:  CHRONULAC Take 45-90 mLs (30-60 g total) by mouth daily as needed for mild constipation (titrate to have 2-3 Bowel movements daily.). What changed:  how much to take  when to take this   lidocaine 5 % Commonly known as:  LIDODERM Place 1 patch onto the skin daily. Remove & Discard patch within 12 hours or as directed by MD   lisinopril 10 MG tablet Commonly known as:  PRINIVIL,ZESTRIL Take 1 tablet (10 mg total) by mouth daily. What changed:  how much to take   LORazepam 0.5 MG tablet Commonly known as:  ATIVAN Take 1 tablet (0.5 mg total) by mouth every 8 (eight) hours as needed (for agitation). What changed:  when to take this   metformin 1000 MG (OSM) 24 hr tablet Commonly known as:  FORTAMET Take 1,000 mg by mouth 2 (two) times daily. What changed:  Another medication with  the same name was removed. Continue taking this medication, and follow the directions you see here.   metoprolol succinate 25 MG 24 hr tablet Commonly known as:  TOPROL-XL Take 25 mg by mouth daily.   MILK OF MAGNESIA PO Take 30 mLs by  mouth as needed (for constipation.).   OLANZapine 5 MG tablet Commonly known as:  ZYPREXA Take 1 tablet (5 mg total) by mouth at bedtime. What changed:  when to take this   potassium chloride SA 20 MEQ tablet Commonly known as:  K-DUR,KLOR-CON Take 20 mEq by mouth daily.   Vitamin D3 2000 units Tabs Take 2,000 Units by mouth daily.         Time coordinating discharge: 35 min  Signed:  Helios Kohlmann U Bruno Leach   [facility] 08/16/2016, 8:16 AM

## 2016-08-16 NOTE — Progress Notes (Signed)
Orders received for pt discharge.  Discharge summary printed.  Unable to review with pt, as she is confused at baseline. Attempted to call to give report on pt to [facility] at [phone], yet no one ever answered, after being on hold for 5 minute minimum.  Pt in stable condition and awaiting transport.

## 2016-08-16 NOTE — Clinical Social Work Note (Signed)
CSW facilitated patient discharge including contacting patient family and facility to confirm patient discharge plans. Clinical information faxed to facility and family agreeable with plan. CSW arranged ambulance transport via [facility] to [facility]. RN to call report prior to discharge ([phone]).  CSW will sign off for now as social work intervention is no longer needed. Please consult us again if new needs arise.  Isaack Preble, CSW [phone]   

## 2016-08-18 LAB — CULTURE, BLOOD (ROUTINE X 2)
Culture: NO GROWTH
Culture: NO GROWTH

## 2017-04-14 ENCOUNTER — Emergency Department (HOSPITAL_COMMUNITY)
Admission: EM | Admit: 2017-04-14 | Discharge: 2017-04-15 | Disposition: A | Discharge status: Skilled Nursing Facility | Payer: Medicare (Managed Care) | Attending: Emergency Medicine | Admitting: Emergency Medicine

## 2017-04-14 ENCOUNTER — Encounter (HOSPITAL_COMMUNITY): Payer: Self-pay | Admitting: Emergency Medicine

## 2017-04-14 ENCOUNTER — Emergency Department (HOSPITAL_COMMUNITY): Payer: Medicare (Managed Care)

## 2017-04-14 DIAGNOSIS — E119 Type 2 diabetes mellitus without complications: Secondary | ICD-10-CM | POA: Insufficient documentation

## 2017-04-14 DIAGNOSIS — Y92128 Other place in nursing home as the place of occurrence of the external cause: Secondary | ICD-10-CM | POA: Diagnosis not present

## 2017-04-14 DIAGNOSIS — I110 Hypertensive heart disease with heart failure: Secondary | ICD-10-CM | POA: Diagnosis not present

## 2017-04-14 DIAGNOSIS — S0083XA Contusion of other part of head, initial encounter: Secondary | ICD-10-CM

## 2017-04-14 DIAGNOSIS — S0990XA Unspecified injury of head, initial encounter: Secondary | ICD-10-CM | POA: Diagnosis present

## 2017-04-14 DIAGNOSIS — Y999 Unspecified external cause status: Secondary | ICD-10-CM | POA: Diagnosis not present

## 2017-04-14 DIAGNOSIS — W01198A Fall on same level from slipping, tripping and stumbling with subsequent striking against other object, initial encounter: Secondary | ICD-10-CM | POA: Diagnosis not present

## 2017-04-14 DIAGNOSIS — W19XXXA Unspecified fall, initial encounter: Secondary | ICD-10-CM

## 2017-04-14 DIAGNOSIS — G309 Alzheimer's disease, unspecified: Secondary | ICD-10-CM | POA: Insufficient documentation

## 2017-04-14 DIAGNOSIS — Y9301 Activity, walking, marching and hiking: Secondary | ICD-10-CM | POA: Insufficient documentation

## 2017-04-14 DIAGNOSIS — Z7984 Long term (current) use of oral hypoglycemic drugs: Secondary | ICD-10-CM | POA: Diagnosis not present

## 2017-04-14 DIAGNOSIS — Z87891 Personal history of nicotine dependence: Secondary | ICD-10-CM | POA: Diagnosis not present

## 2017-04-14 DIAGNOSIS — I5032 Chronic diastolic (congestive) heart failure: Secondary | ICD-10-CM | POA: Diagnosis not present

## 2017-04-14 NOTE — ED Provider Notes (Signed)
[facility] DEPT Provider Note   CSN: [id number] Arrival date & time: 04/14/17  2102     History   Chief Complaint Chief Complaint  Patient presents with  . Fall    HPI Carol Rubio is a 71 y.o. female.  HPI Level 5 caveat due to dementia.History provided by EMS. History of dementia, hepatitis C cirrhosis, and diabetes. Is not on blood thinners. Per EMS, patient was observed by staff to be walking down the hall when she slipped and fell on the floor onto her right side. She did not hit her head or have loss of consciousness. Per facility her mental status is baseline. No apparent injury by EMS, but was brought to ED for evaluation. Her son is at bedside and reports that she had a fall 2-3 days ago, where she did hit her head and has bruising to the left side of her head. She was not brought to the ED for evaluation.  Past Medical History:  Diagnosis Date  . Alzheimer's dementia    a. placed in facility in 2017 for wandering/combativeness.  . Anxiety   . Arthritis    SHOULDER,KNEES,BACK  . Bipolar disorder ([place])    Sees [facility]  . Cataract    had surgery for rt  . Chronic back pain   . Chronic hepatitis C ([place])   . Cirrhosis of liver ([place])   . Depression    Sees [facility]  . Diabetes mellitus   . Encephalopathy 09/2014   [facility]  . Glaucoma   . Hepatitis C virus   . History of kidney stones   . Hypertension   . Thrombocytopenia ([place])   . Urinary incontinence     Patient Active Problem List   Diagnosis Date Noted  . Encephalopathy   . Paroxysmal atrial fibrillation ([place]) 08/14/2016  . Fever 08/13/2016  . Hypoxia 08/13/2016  . HCAP (healthcare-associated pneumonia) 08/13/2016  . Arthritis of left knee 12/23/2014  . Thrombocytopenia ([place]) 11/18/2014  . History of substance abuse 11/18/2014  . Former smoker 11/18/2014  . Chronic back pain 11/18/2014  . Glaucoma 11/18/2014  . Mixed incontinence 11/18/2014  . Anxiety state  11/18/2014  . Bipolar affective disorder ([place]) 11/18/2014  . History of CHF (congestive heart failure) 11/18/2014  . Encephalopathy, hepatic ([place]) 11/18/2014  . Diabetes type 2, controlled ([place]) 11/18/2014  . Diabetes mellitus ([place]) 09/16/2014  . Anxiety 09/16/2014  . Depression 09/16/2014  . Dementia 09/16/2014  . Chronic diastolic CHF (congestive heart failure) ([place]) 09/16/2014  . Allergic rhinitis 09/16/2014  . Overactive bladder 09/16/2014  . Insomnia 09/16/2014  . Hepatic encephalopathy ()   . Chronic hepatitis C without hepatic coma ([place])   . Essential hypertension   . Weakness 09/08/2014  . Acute encephalopathy 09/08/2014    Past Surgical History:  Procedure Laterality Date  . CATARACT EXTRACTION W/ INTRAOCULAR LENS  IMPLANT, BILATERAL    . CHOLECYSTECTOMY    . EYE SURGERY Bilateral   . POLYPECTOMY      OB History    No data available       Home Medications    Prior to Admission medications   Medication Sig Start Date End Date Taking? Authorizing Provider  carbamazepine (TEGRETOL) 200 MG tablet Take 200 mg by mouth 3 (three) times daily.    Yes [provider]  Cholecalciferol (VITAMIN D3) 2000 units TABS Take 2,000 Units by mouth daily.   Yes [provider]  citalopram (CELEXA) 20 MG tablet Take 20 mg by  mouth daily.   Yes [provider]  furosemide (LASIX) 40 MG tablet Take 40 mg by mouth once daily Patient taking differently: 80 mg. Take 40 mg by mouth once daily 08/16/16  Yes Vann, Jessica U, DO  ibuprofen (ADVIL,MOTRIN) 400 MG tablet Take 400 mg by mouth 2 (two) times daily.   Yes [provider]  lactulose (CHRONULAC) 10 GM/15ML solution Take 45-90 mLs (30-60 g total) by mouth daily as needed for mild constipation (titrate to have 2-3 Bowel movements daily.). Patient taking differently: Take 20 g by mouth 3 (three) times daily.  11/18/14  Yes Tysinger, Camelia Eng, PA-C  lisinopril (PRINIVIL,ZESTRIL) 10 MG tablet Take 1  tablet (10 mg total) by mouth daily. 02/09/15  Yes Tysinger, Camelia Eng, PA-C  metFORMIN (GLUCOPHAGE) 1000 MG tablet Take 1,000 mg by mouth 2 (two) times daily with a meal.   Yes [provider]  metoprolol succinate (TOPROL-XL) 25 MG 24 hr tablet Take 25 mg by mouth daily.   Yes [provider]  OLANZapine (ZYPREXA) 5 MG tablet Take 1 tablet (5 mg total) by mouth at bedtime. Patient taking differently: Take 5 mg by mouth 2 (two) times daily.  08/16/16  Yes Vann, Jessica U, DO  potassium chloride SA (K-DUR,KLOR-CON) 20 MEQ tablet Take 20 mEq by mouth daily.   Yes [provider]  UNABLE TO FIND Apply 0.5 mg topically daily as needed. Med Name: Lorazepam Topical Gel Rub   Yes [provider]  insulin glargine (LANTUS) 100 UNIT/ML injection Inject 0.15 mLs (15 Units total) into the skin at bedtime. Patient not taking: Reported on 04/14/2017 08/16/16   Geradine Girt, DO    Family History Family History  Problem Relation Age of Onset  . Stroke Mother   . Cancer Father   . Diabetes Sister   . Stroke Sister     Social History Social History  Substance Use Topics  . Smoking status: Former Smoker    Packs/day: 1.00    Years: 50.00    Types: Cigarettes    Quit date: 12/01/2004  . Smokeless tobacco: Never Used     Comment: 43 pack year history  . Alcohol use No     Comment: hx/o alcohol abuse in 50s and youngerquit 89     Allergies   Tuberculin tests   Review of Systems Review of Systems  Unable to perform ROS: Dementia     Physical Exam Updated Vital Signs BP 134/68   Pulse 64   Temp 98.2 F (36.8 C) (Oral)   Resp 18   SpO2 96%   Physical Exam Physical Exam  Nursing note and vitals reviewed. Constitutional: Well developed, well nourished, non-toxic, and in no acute distress Head: Normocephalic and bruising to the left side of the face.  Mouth/Throat: Oropharynx is clear and moist.  Neck: Normal range of motion. Neck supple.  no  cervical spine tenderness Cardiovascular: Normal rate and regular rhythm.   Pulmonary/Chest: Effort normal and breath sounds normal.  no chest wall tenderness Abdominal: Soft. There is no tenderness. There is no rebound and no guarding.  Musculoskeletal: Normal range of motion of all 4 extremities.  no TLS spine tenderness  Neurological: Alert, no facial droop, fluent speech, moves all extremities symmetrically, EOMI, PERRL Skin: Skin is warm and dry.  Psychiatric: Cooperative   ED Treatments / Results  Labs (all labs ordered are listed, but only abnormal results are displayed) Labs Reviewed - No data to display  EKG  EKG  Interpretation None       Radiology Ct Head Wo Contrast  Result Date: 04/15/2017 CLINICAL DATA:  Status post witnessed fall. Concern for head or cervical spine injury. Initial encounter. EXAM: CT HEAD WITHOUT CONTRAST CT CERVICAL SPINE WITHOUT CONTRAST TECHNIQUE: Multidetector CT imaging of the head and cervical spine was performed following the standard protocol without intravenous contrast. Multiplanar CT image reconstructions of the cervical spine were also generated. COMPARISON:  CT of the head performed 08/13/2016, cervical spine radiographs performed 06/16/2010, and MRI of the brain from 05/03/2012 FINDINGS: CT HEAD FINDINGS Brain: No evidence of acute infarction, hemorrhage, hydrocephalus, extra-axial collection or mass lesion/mass effect. Prominence of the ventricles and sulci reflects mild to moderate cortical volume loss. Mild cerebellar atrophy is noted. Scattered periventricular and subcortical white matter change likely reflects small vessel ischemic microangiopathy. The brainstem and fourth ventricle are within normal limits. The basal ganglia are unremarkable in appearance. The cerebral hemispheres demonstrate grossly normal gray-white differentiation. No mass effect or midline shift is seen. Vascular: No hyperdense vessel or unexpected calcification. Skull:  There is no evidence of fracture; visualized osseous structures are unremarkable in appearance. Sinuses/Orbits: The orbits are within normal limits. The paranasal sinuses and mastoid air cells are well-aerated. Other: No significant soft tissue abnormalities are seen. CT CERVICAL SPINE FINDINGS Alignment: There is mild grade 1 anterolisthesis of C4 on C5 and of C5 on C6. Skull base and vertebrae: No acute fracture. No primary bone lesion or focal pathologic process. Soft tissues and spinal canal: No prevertebral fluid or swelling. No visible canal hematoma. Disc levels: Scattered anterior and posterior disc osteophyte complexes are noted along the lower cervical spine. Intervertebral disc spaces are largely preserved. Underlying facet disease is noted along the mid cervical spine. Upper chest: The visualized lung apices are clear. The thyroid gland is grossly unremarkable in appearance. Mild calcification is noted at the carotid bifurcations bilaterally. Other: No additional soft tissue abnormalities are seen. IMPRESSION: 1. No evidence of traumatic intracranial injury or fracture. 2. No evidence of acute fracture or subluxation along the cervical spine. 3. Mild to moderate cortical volume loss and scattered small vessel ischemic microangiopathy. 4. Mild degenerative change along the cervical spine. 5. Mild calcification at the carotid bifurcations bilaterally. Electronically Signed   By: Garald Balding M.D.   On: 04/15/2017 00:09   Ct Cervical Spine Wo Contrast  Result Date: 04/15/2017 CLINICAL DATA:  Status post witnessed fall. Concern for head or cervical spine injury. Initial encounter. EXAM: CT HEAD WITHOUT CONTRAST CT CERVICAL SPINE WITHOUT CONTRAST TECHNIQUE: Multidetector CT imaging of the head and cervical spine was performed following the standard protocol without intravenous contrast. Multiplanar CT image reconstructions of the cervical spine were also generated. COMPARISON:  CT of the head performed  08/13/2016, cervical spine radiographs performed 06/16/2010, and MRI of the brain from 05/03/2012 FINDINGS: CT HEAD FINDINGS Brain: No evidence of acute infarction, hemorrhage, hydrocephalus, extra-axial collection or mass lesion/mass effect. Prominence of the ventricles and sulci reflects mild to moderate cortical volume loss. Mild cerebellar atrophy is noted. Scattered periventricular and subcortical white matter change likely reflects small vessel ischemic microangiopathy. The brainstem and fourth ventricle are within normal limits. The basal ganglia are unremarkable in appearance. The cerebral hemispheres demonstrate grossly normal gray-white differentiation. No mass effect or midline shift is seen. Vascular: No hyperdense vessel or unexpected calcification. Skull: There is no evidence of fracture; visualized osseous structures are unremarkable in appearance. Sinuses/Orbits: The orbits are within normal limits. The paranasal sinuses and  mastoid air cells are well-aerated. Other: No significant soft tissue abnormalities are seen. CT CERVICAL SPINE FINDINGS Alignment: There is mild grade 1 anterolisthesis of C4 on C5 and of C5 on C6. Skull base and vertebrae: No acute fracture. No primary bone lesion or focal pathologic process. Soft tissues and spinal canal: No prevertebral fluid or swelling. No visible canal hematoma. Disc levels: Scattered anterior and posterior disc osteophyte complexes are noted along the lower cervical spine. Intervertebral disc spaces are largely preserved. Underlying facet disease is noted along the mid cervical spine. Upper chest: The visualized lung apices are clear. The thyroid gland is grossly unremarkable in appearance. Mild calcification is noted at the carotid bifurcations bilaterally. Other: No additional soft tissue abnormalities are seen. IMPRESSION: 1. No evidence of traumatic intracranial injury or fracture. 2. No evidence of acute fracture or subluxation along the cervical  spine. 3. Mild to moderate cortical volume loss and scattered small vessel ischemic microangiopathy. 4. Mild degenerative change along the cervical spine. 5. Mild calcification at the carotid bifurcations bilaterally. Electronically Signed   By: Garald Balding M.D.   On: 04/15/2017 00:09    Procedures Procedures (including critical care time)  Medications Ordered in ED Medications - No data to display   Initial Impression / Assessment and Plan / ED Course  I have reviewed the triage vital signs and the nursing notes.  Pertinent labs & imaging results that were available during my care of the patient were reviewed by me and considered in my medical decision making (see chart for details).     Presents after fall without obvious injury. Mental status baseline Does have bruising on face from fall 2 days ago. Ct head and cervical spine without acute injury. Felt stable for discharge home.  Final Clinical Impressions(s) / ED Diagnoses   Final diagnoses:  Fall, initial encounter  Contusion of face, initial encounter    New Prescriptions New Prescriptions   No medications on file     Forde Dandy, MD 04/15/17 [id number]

## 2017-04-14 NOTE — ED Triage Notes (Signed)
Brought in by EMS from [facility] for further evaluation after her witnessed fall tonight.  Pt was observed ambulating and then slipped and fell on the floor on her right side; pt did not hit head nor lost her consciousness.  Per facility staff, pt on her "normal self"--- has dementia.  No s/s apparent injury by EMS.

## 2017-04-15 NOTE — Discharge Instructions (Signed)
You do not have any serious injury from your fall. Return for worsening symptoms, including confusion, difficulty walking, or any other symptoms concerning to you.

## 2017-08-02 ENCOUNTER — Emergency Department (HOSPITAL_COMMUNITY)
Admission: EM | Admit: 2017-08-02 | Discharge: 2017-08-03 | Disposition: A | Discharge status: Home / Self Care | Payer: Medicare (Managed Care) | Attending: Emergency Medicine | Admitting: Emergency Medicine

## 2017-08-02 DIAGNOSIS — I5032 Chronic diastolic (congestive) heart failure: Secondary | ICD-10-CM | POA: Diagnosis not present

## 2017-08-02 DIAGNOSIS — Z79899 Other long term (current) drug therapy: Secondary | ICD-10-CM | POA: Insufficient documentation

## 2017-08-02 DIAGNOSIS — Y999 Unspecified external cause status: Secondary | ICD-10-CM | POA: Diagnosis not present

## 2017-08-02 DIAGNOSIS — Y92122 Bedroom in nursing home as the place of occurrence of the external cause: Secondary | ICD-10-CM | POA: Diagnosis not present

## 2017-08-02 DIAGNOSIS — S0181XA Laceration without foreign body of other part of head, initial encounter: Secondary | ICD-10-CM

## 2017-08-02 DIAGNOSIS — W19XXXA Unspecified fall, initial encounter: Secondary | ICD-10-CM | POA: Diagnosis not present

## 2017-08-02 DIAGNOSIS — Z7984 Long term (current) use of oral hypoglycemic drugs: Secondary | ICD-10-CM | POA: Diagnosis not present

## 2017-08-02 DIAGNOSIS — E119 Type 2 diabetes mellitus without complications: Secondary | ICD-10-CM | POA: Diagnosis not present

## 2017-08-02 DIAGNOSIS — G309 Alzheimer's disease, unspecified: Secondary | ICD-10-CM | POA: Diagnosis not present

## 2017-08-02 DIAGNOSIS — Z87891 Personal history of nicotine dependence: Secondary | ICD-10-CM | POA: Diagnosis not present

## 2017-08-02 DIAGNOSIS — I110 Hypertensive heart disease with heart failure: Secondary | ICD-10-CM | POA: Insufficient documentation

## 2017-08-02 DIAGNOSIS — Y939 Activity, unspecified: Secondary | ICD-10-CM | POA: Diagnosis not present

## 2017-08-02 DIAGNOSIS — S0993XA Unspecified injury of face, initial encounter: Secondary | ICD-10-CM | POA: Diagnosis present

## 2017-08-02 MED ORDER — LIDOCAINE HCL (PF) 1 % IJ SOLN
INTRAMUSCULAR | Status: AC
  Filled 2017-08-02: qty 5

## 2017-08-02 NOTE — ED Triage Notes (Signed)
Pt here from [facility] for fall by  Ems,  Unwitnessed fall found in floor by bed, laceration noted right eye brow bleeding controlled think fall occurred at 2215, no blood thinners.

## 2017-08-03 ENCOUNTER — Encounter (HOSPITAL_COMMUNITY): Payer: Self-pay

## 2017-08-03 MED ORDER — LIDOCAINE HCL (PF) 1 % IJ SOLN
5.0000 mL | Freq: Once | INTRAMUSCULAR | Status: AC
  Administered 2017-08-03: 5 mL via INTRADERMAL

## 2017-08-03 NOTE — Discharge Instructions (Signed)
Suture removal in 7 days

## 2017-08-03 NOTE — ED Notes (Signed)
ED Provider at bedside. 

## 2017-08-03 NOTE — ED Notes (Signed)
Patient oriented to baseline. Stable and ambulatory. Patient verbalized understanding of the discharge instructions.  Patient belongings were taken by the patient.

## 2017-08-03 NOTE — ED Triage Notes (Addendum)
Lac to right forehead.  Approx 4 cm in length edges can be approximated.

## 2017-08-03 NOTE — ED Provider Notes (Signed)
[facility] DEPT Provider Note   CSN: [id number] Arrival date & time: 08/02/17  2349     History   Chief Complaint Chief Complaint  Patient presents with  . Fall    Level V caveat: Dementia  HPI Carol Rubio is a 71 y.o. female.  HPI Patient is a 71 year old female with a history of dementia and is nonverbal baseline he presents the emergency department after an unwitnessed fall and found lying next to her bed on the floor.  They noted a laceration above her left eye and she was sent to the ER for further evaluation.  Patient is not on chronic anticoagulation.  Patient is baseline mental status per facility.  Noted to be moving all four extremities for EMS.no other issues   Past Medical History:  Diagnosis Date  . Alzheimer's dementia    a. placed in facility in 2017 for wandering/combativeness.  . Anxiety   . Arthritis    SHOULDER,KNEES,BACK  . Bipolar disorder ([place])    Sees [facility]  . Cataract    had surgery for rt  . Chronic back pain   . Chronic hepatitis C ([place])   . Cirrhosis of liver ([place])   . Depression    Sees [facility]  . Diabetes mellitus   . Encephalopathy 09/2014   [facility]  . Glaucoma   . Hepatitis C virus   . History of kidney stones   . Hypertension   . Thrombocytopenia ([place])   . Urinary incontinence     Patient Active Problem List   Diagnosis Date Noted  . Encephalopathy   . Paroxysmal atrial fibrillation ([place]) 08/14/2016  . Fever 08/13/2016  . Hypoxia 08/13/2016  . HCAP (healthcare-associated pneumonia) 08/13/2016  . Arthritis of left knee 12/23/2014  . Thrombocytopenia ([place]) 11/18/2014  . History of substance abuse 11/18/2014  . Former smoker 11/18/2014  . Chronic back pain 11/18/2014  . Glaucoma 11/18/2014  . Mixed incontinence 11/18/2014  . Anxiety state 11/18/2014  . Bipolar affective disorder ([place]) 11/18/2014  . History of CHF (congestive heart failure) 11/18/2014  . Encephalopathy, hepatic  ([place]) 11/18/2014  . Diabetes type 2, controlled ([place]) 11/18/2014  . Diabetes mellitus ([place]) 09/16/2014  . Anxiety 09/16/2014  . Depression 09/16/2014  . Dementia 09/16/2014  . Chronic diastolic CHF (congestive heart failure) ([place]) 09/16/2014  . Allergic rhinitis 09/16/2014  . Overactive bladder 09/16/2014  . Insomnia 09/16/2014  . Hepatic encephalopathy ([place])   . Chronic hepatitis C without hepatic coma ([place])   . Essential hypertension   . Weakness 09/08/2014  . Acute encephalopathy 09/08/2014    Past Surgical History:  Procedure Laterality Date  . CATARACT EXTRACTION W/ INTRAOCULAR LENS  IMPLANT, BILATERAL    . CHOLECYSTECTOMY    . EYE SURGERY Bilateral   . POLYPECTOMY      OB History    No data available       Home Medications    Prior to Admission medications   Medication Sig Start Date End Date Taking? Authorizing Provider  carbamazepine (TEGRETOL) 200 MG tablet Take 200 mg by mouth 3 (three) times daily.     [provider]  Cholecalciferol (VITAMIN D3) 2000 units TABS Take 2,000 Units by mouth daily.    [provider]  citalopram (CELEXA) 20 MG tablet Take 20 mg by mouth daily.    [provider]  furosemide (LASIX) 40 MG tablet Take 40 mg by mouth once daily Patient taking differently: 80 mg. Take 40 mg by mouth once  daily 08/16/16   Geradine Girt, DO  ibuprofen (ADVIL,MOTRIN) 400 MG tablet Take 400 mg by mouth 2 (two) times daily.    [provider]  insulin glargine (LANTUS) 100 UNIT/ML injection Inject 0.15 mLs (15 Units total) into the skin at bedtime. Patient not taking: Reported on 04/14/2017 08/16/16   Geradine Girt, DO  lactulose (CHRONULAC) 10 GM/15ML solution Take 45-90 mLs (30-60 g total) by mouth daily as needed for mild constipation (titrate to have 2-3 Bowel movements daily.). Patient taking differently: Take 20 g by mouth 3 (three) times daily.  11/18/14   Tysinger, Camelia Eng, PA-C  lisinopril (PRINIVIL,ZESTRIL)  10 MG tablet Take 1 tablet (10 mg total) by mouth daily. 02/09/15   Tysinger, Camelia Eng, PA-C  metFORMIN (GLUCOPHAGE) 1000 MG tablet Take 1,000 mg by mouth 2 (two) times daily with a meal.    [provider]  metoprolol succinate (TOPROL-XL) 25 MG 24 hr tablet Take 25 mg by mouth daily.    [provider]  OLANZapine (ZYPREXA) 5 MG tablet Take 1 tablet (5 mg total) by mouth at bedtime. Patient taking differently: Take 5 mg by mouth 2 (two) times daily.  08/16/16   Geradine Girt, DO  potassium chloride SA (K-DUR,KLOR-CON) 20 MEQ tablet Take 20 mEq by mouth daily.    [provider]  UNABLE TO FIND Apply 0.5 mg topically daily as needed. Med Name: Lorazepam Topical Gel Rub    [provider]    Family History Family History  Problem Relation Age of Onset  . Stroke Mother   . Cancer Father   . Diabetes Sister   . Stroke Sister     Social History Social History  Substance Use Topics  . Smoking status: Former Smoker    Packs/day: 1.00    Years: 50.00    Types: Cigarettes    Quit date: 12/01/2004  . Smokeless tobacco: Never Used     Comment: 43 pack year history  . Alcohol use No     Comment: hx/o alcohol abuse in 50s and youngerquit 89     Allergies   Tuberculin tests   Review of Systems Review of Systems  Unable to perform ROS: Dementia     Physical Exam Updated Vital Signs BP (!) 165/91   Pulse (!) 50   Resp 14   SpO2 97%   Physical Exam  Constitutional: She appears well-developed and well-nourished.  HENT:  Head: Normocephalic.  Superficial laceration of the left lateral forehead without active bleeding or associated hematoma.  Eyes: EOM are normal.  Neck: Normal range of motion. Neck supple.  No C-spine step-offs or obvious tenderness  Pulmonary/Chest: Effort normal.  Abdominal: She exhibits no distension.  Musculoskeletal: Normal range of motion.  Full range of motion of bilateral shoulders, elbows and wrists. Full range of  motion of bilateral hips, knees and ankles.    Neurological: She is alert.  Psychiatric: She has a normal mood and affect.  Nursing note and vitals reviewed.    ED Treatments / Results  Labs (all labs ordered are listed, but only abnormal results are displayed) Labs Reviewed - No data to display  EKG  EKG Interpretation None       Radiology No results found.  Procedures .Marland KitchenLaceration Repair Performed by: Jola Schmidt Authorized by: Jola Schmidt     Consent: Verbal consent obtained. Risks and benefits: risks, benefits and alternatives were discussed Patient identity confirmed: provided demographic data Time out performed prior to procedure  Prepped and Draped in normal sterile fashion Wound explored Laceration Location: Left lateral forehead Laceration Length: 2.5cm No Foreign Bodies seen or palpated Anesthesia: local infiltration Local anesthetic: lidocaine 1% without epinephrine Anesthetic total: 4 ml Irrigation method: syringe Amount of cleaning: standard Skin closure: 5-0 Prolene Number of sutures or staples: 6 Technique: running interlocked Patient tolerance: Patient tolerated the procedure well with no immediate complications.    Medications Ordered in ED Medications  lidocaine (PF) (XYLOCAINE) 1 % injection (not administered)     Initial Impression / Assessment and Plan / ED Course  I have reviewed the triage vital signs and the nursing notes.  Pertinent labs & imaging results that were available during my care of the patient were reviewed by me and considered in my medical decision making (see chart for details).     Minor head injury.  No associated hematoma.  Laceration repaired.  Patient is not on anticoagulation.  Moves all 4 extremities equally.  Full range of motion of major joints.  I don't believe the patient needs advanced imaging of her head or neck at this time.  Final Clinical Impressions(s) / ED Diagnoses   Final diagnoses:    Forehead laceration, initial encounter    New Prescriptions New Prescriptions   No medications on file     Jola Schmidt, MD 08/03/17 [phone]

## 2019-12-02 ENCOUNTER — Emergency Department (HOSPITAL_COMMUNITY)
Admission: EM | Admit: 2019-12-02 | Discharge: 2019-12-03 | Disposition: A | Discharge status: Skilled Nursing Facility | Payer: Medicare (Managed Care) | Attending: Emergency Medicine | Admitting: Emergency Medicine

## 2019-12-02 ENCOUNTER — Emergency Department (HOSPITAL_COMMUNITY): Payer: Medicare (Managed Care)

## 2019-12-02 ENCOUNTER — Other Ambulatory Visit: Payer: Self-pay

## 2019-12-02 DIAGNOSIS — G309 Alzheimer's disease, unspecified: Secondary | ICD-10-CM | POA: Diagnosis not present

## 2019-12-02 DIAGNOSIS — S0990XA Unspecified injury of head, initial encounter: Secondary | ICD-10-CM

## 2019-12-02 DIAGNOSIS — Z7984 Long term (current) use of oral hypoglycemic drugs: Secondary | ICD-10-CM | POA: Diagnosis not present

## 2019-12-02 DIAGNOSIS — S0083XA Contusion of other part of head, initial encounter: Secondary | ICD-10-CM | POA: Diagnosis not present

## 2019-12-02 DIAGNOSIS — E119 Type 2 diabetes mellitus without complications: Secondary | ICD-10-CM | POA: Diagnosis not present

## 2019-12-02 DIAGNOSIS — W19XXXA Unspecified fall, initial encounter: Secondary | ICD-10-CM

## 2019-12-02 DIAGNOSIS — Y999 Unspecified external cause status: Secondary | ICD-10-CM | POA: Insufficient documentation

## 2019-12-02 DIAGNOSIS — I10 Essential (primary) hypertension: Secondary | ICD-10-CM | POA: Insufficient documentation

## 2019-12-02 DIAGNOSIS — Y9389 Activity, other specified: Secondary | ICD-10-CM | POA: Insufficient documentation

## 2019-12-02 DIAGNOSIS — S0081XA Abrasion of other part of head, initial encounter: Secondary | ICD-10-CM | POA: Diagnosis not present

## 2019-12-02 DIAGNOSIS — Y92122 Bedroom in nursing home as the place of occurrence of the external cause: Secondary | ICD-10-CM | POA: Diagnosis not present

## 2019-12-02 DIAGNOSIS — W01190A Fall on same level from slipping, tripping and stumbling with subsequent striking against furniture, initial encounter: Secondary | ICD-10-CM | POA: Diagnosis not present

## 2019-12-02 MED ORDER — LORAZEPAM 2 MG/ML IJ SOLN
1.0000 mg | Freq: Once | INTRAMUSCULAR | Status: AC
  Administered 2019-12-02: 1 mg via INTRAMUSCULAR
  Filled 2019-12-02: qty 1

## 2019-12-02 NOTE — ED Notes (Signed)
[facility] called for Pt transport back to facility

## 2019-12-02 NOTE — ED Notes (Signed)
Per EDP,Rancour,MD told not to ambulate pt. At this time.

## 2019-12-02 NOTE — ED Notes (Signed)
EKG given to EDP,Rancour,MD., for review. 

## 2019-12-02 NOTE — Discharge Instructions (Addendum)
Imaging shows no evidence of serious head injury.  CT head and C-spine are negative. Follow-up with your doctor.  Return to the ED with new or worsening symptoms.

## 2019-12-02 NOTE — ED Provider Notes (Signed)
[facility] DEPT Provider Note   CSN: [id number] Arrival date & time: 12/02/19  1823     History Chief Complaint  Patient presents with  . Fall    Carol Rubio is a 74 y.o. female.  Level 5 caveat for dementia.  Patient from nursing facility with unwitnessed fall.  She apparently was walking in her bedroom and fell and hit her head on the nightstand.  Sustained laceration hematoma to her forehead.  She is nonverbal at baseline per report.  She has no evidence of other injury.  Also has a history of hepatitis C as well as chronic cirrhosis.  She is moving all extremities.  She denies any neck or back pain.  She is able to mumble a few words but not give any significant history.  No blood thinners on medication list  The history is provided by the patient and the EMS personnel.  Fall       Past Medical History:  Diagnosis Date  . Alzheimer's dementia    a. placed in facility in 2017 for wandering/combativeness.  . Anxiety   . Arthritis    SHOULDER,KNEES,BACK  . Bipolar disorder ([place])    Sees [facility]  . Cataract    had surgery for rt  . Chronic back pain   . Chronic hepatitis C ([place])   . Cirrhosis of liver ([place])   . Depression    Sees [facility]  . Diabetes mellitus   . Encephalopathy 09/2014   [facility]  . Glaucoma   . Hepatitis C virus   . History of kidney stones   . Hypertension   . Thrombocytopenia ([place])   . Urinary incontinence     Patient Active Problem List   Diagnosis Date Noted  . Encephalopathy   . Paroxysmal atrial fibrillation ([place]) 08/14/2016  . Fever 08/13/2016  . Hypoxia 08/13/2016  . HCAP (healthcare-associated pneumonia) 08/13/2016  . Arthritis of left knee 12/23/2014  . Thrombocytopenia ([place]) 11/18/2014  . History of substance abuse ([place]) 11/18/2014  . Former smoker 11/18/2014  . Chronic back pain 11/18/2014  . Glaucoma 11/18/2014  . Mixed incontinence 11/18/2014  . Anxiety  state 11/18/2014  . Bipolar affective disorder ([place]) 11/18/2014  . History of CHF (congestive heart failure) 11/18/2014  . Encephalopathy, hepatic ([place]) 11/18/2014  . Diabetes type 2, controlled ([place]) 11/18/2014  . Diabetes mellitus ([place]) 09/16/2014  . Anxiety 09/16/2014  . Depression 09/16/2014  . Dementia ([place]) 09/16/2014  . Chronic diastolic CHF (congestive heart failure) ([place]) 09/16/2014  . Allergic rhinitis 09/16/2014  . Overactive bladder 09/16/2014  . Insomnia 09/16/2014  . Hepatic encephalopathy ([place])   . Chronic hepatitis C without hepatic coma ([place])   . Essential hypertension   . Weakness 09/08/2014  . Acute encephalopathy 09/08/2014    Past Surgical History:  Procedure Laterality Date  . CATARACT EXTRACTION W/ INTRAOCULAR LENS  IMPLANT, BILATERAL    . CHOLECYSTECTOMY    . EYE SURGERY Bilateral   . POLYPECTOMY       OB History   No obstetric history on file.     Family History  Problem Relation Age of Onset  . Stroke Mother   . Cancer Father   . Diabetes Sister   . Stroke Sister     Social History   Tobacco Use  . Smoking status: Former Smoker    Packs/day: 1.00    Years: 50.00    Pack years: 50.00    Types: Cigarettes    Quit  date: 12/01/2004    Years since quitting: 15.0  . Smokeless tobacco: Never Used  . Tobacco comment: 43 pack year history  Substance Use Topics  . Alcohol use: No    Alcohol/week: 0.0 standard drinks    Comment: hx/o alcohol abuse in 50s and youngerquit 89  . Drug use: No    Comment: history of drug abuse in 79s and younger    Home Medications Prior to Admission medications   Medication Sig Start Date End Date Taking? Authorizing Provider  carbamazepine (TEGRETOL) 200 MG tablet Take 200 mg by mouth 3 (three) times daily.     [provider]  Cholecalciferol (VITAMIN D3) 2000 units TABS Take 2,000 Units by mouth daily.    [provider]  citalopram (CELEXA) 20 MG tablet Take 20 mg by mouth daily.     [provider]  furosemide (LASIX) 40 MG tablet Take 40 mg by mouth once daily Patient taking differently: 80 mg. Take 40 mg by mouth once daily 08/16/16   Geradine Girt, DO  ibuprofen (ADVIL,MOTRIN) 400 MG tablet Take 400 mg by mouth 2 (two) times daily.    [provider]  insulin glargine (LANTUS) 100 UNIT/ML injection Inject 0.15 mLs (15 Units total) into the skin at bedtime. Patient not taking: Reported on 04/14/2017 08/16/16   Geradine Girt, DO  lactulose (CHRONULAC) 10 GM/15ML solution Take 45-90 mLs (30-60 g total) by mouth daily as needed for mild constipation (titrate to have 2-3 Bowel movements daily.). Patient taking differently: Take 20 g by mouth 3 (three) times daily.  11/18/14   Tysinger, Camelia Eng, PA-C  lisinopril (PRINIVIL,ZESTRIL) 10 MG tablet Take 1 tablet (10 mg total) by mouth daily. 02/09/15   Tysinger, Camelia Eng, PA-C  metFORMIN (GLUCOPHAGE) 1000 MG tablet Take 1,000 mg by mouth 2 (two) times daily with a meal.    [provider]  metoprolol succinate (TOPROL-XL) 25 MG 24 hr tablet Take 25 mg by mouth daily.    [provider]  OLANZapine (ZYPREXA) 5 MG tablet Take 1 tablet (5 mg total) by mouth at bedtime. Patient taking differently: Take 5 mg by mouth 2 (two) times daily.  08/16/16   Geradine Girt, DO  potassium chloride SA (K-DUR,KLOR-CON) 20 MEQ tablet Take 20 mEq by mouth daily.    [provider]  UNABLE TO FIND Apply 0.5 mg topically daily as needed. Med Name: Lorazepam Topical Gel Rub    [provider]    Allergies    Tuberculin tests  Review of Systems   Review of Systems  Unable to perform ROS: Dementia    Physical Exam Updated Vital Signs BP (!) 148/89 (BP Location: Left Arm)   Pulse 61   Resp 16   SpO2 100%   Physical Exam Vitals and nursing note reviewed.  Constitutional:      General: She is not in acute distress.    Appearance: She is well-developed.     Comments: Mostly nonverbal,  responds Carol Rubio when asked what her name is.  HENT:     Head: Atraumatic.     Comments: Hematoma and abrasion to left forehead.    Mouth/Throat:     Pharynx: No oropharyngeal exudate.  Eyes:     Conjunctiva/sclera: Conjunctivae normal.     Pupils: Pupils are equal, round, and reactive to light.  Neck:     Comments: No midline C-spine tender Cardiovascular:     Rate and Rhythm: Normal rate and regular rhythm.  Heart sounds: Normal heart sounds. No murmur.  Pulmonary:     Effort: Pulmonary effort is normal. No respiratory distress.     Breath sounds: Normal breath sounds.  Abdominal:     Palpations: Abdomen is soft.     Tenderness: There is no abdominal tenderness. There is no guarding or rebound.  Musculoskeletal:        General: No tenderness. Normal range of motion.     Cervical back: Normal range of motion and neck supple.     Comments: Pelvis stable.  Able to range hips without pain  Skin:    General: Skin is warm.  Neurological:     Mental Status: She is alert.     Cranial Nerves: No cranial nerve deficit.     Motor: No abnormal muscle tone.     Coordination: Coordination normal.     Comments: Oriented x1, moves all extremities, does not follow commands consistently  Psychiatric:        Behavior: Behavior normal.     ED Results / Procedures / Treatments   Labs (all labs ordered are listed, but only abnormal results are displayed) Labs Reviewed - No data to display  EKG None  Radiology DG Chest 2 View  Result Date: 12/02/2019 CLINICAL DATA:  Fall EXAM: CHEST - 2 VIEW COMPARISON:  08/14/2016 FINDINGS: Mild interstitial opacity bilaterally. Cardiomediastinal contours are normal. No pleural effusion. IMPRESSION: Mild interstitial opacities may indicate mild pulmonary edema. Electronically Signed   By: Ulyses Jarred M.D.   On: 12/02/2019 19:40   DG Pelvis 1-2 Views  Result Date: 12/02/2019 CLINICAL DATA:  Fall EXAM: PELVIS - 1-2 VIEW COMPARISON:  None. FINDINGS:  There is no evidence of pelvic fracture or diastasis. No pelvic bone lesions are seen. IMPRESSION: Negative. Electronically Signed   By: Ulyses Jarred M.D.   On: 12/02/2019 19:45   CT Head Wo Contrast  Result Date: 12/02/2019 CLINICAL DATA:  Acute pain due to trauma. Hematoma to mid forehead. EXAM: CT HEAD WITHOUT CONTRAST CT CERVICAL SPINE WITHOUT CONTRAST TECHNIQUE: Multidetector CT imaging of the head and cervical spine was performed following the standard protocol without intravenous contrast. Multiplanar CT image reconstructions of the cervical spine were also generated. COMPARISON:  None. FINDINGS: CT HEAD FINDINGS Brain: No evidence of acute infarction, hemorrhage, hydrocephalus, extra-axial collection or mass lesion/mass effect. Advanced atrophy and chronic microvascular ischemic changes are noted. Vascular: No hyperdense vessel or unexpected calcification. Skull: There is extensive frontal scalp swelling with evidence for a cephalohematoma. There is no underlying calvarial defect or fracture. Sinuses/Orbits: No acute finding. Other: None. CT CERVICAL SPINE FINDINGS Alignment: There is reversal of the normal cervical lordotic curvature. Skull base and vertebrae: No acute fracture. No primary bone lesion or focal pathologic process. Soft tissues and spinal canal: No prevertebral fluid or swelling. No visible canal hematoma. Disc levels: Multilevel disc height loss is noted throughout the cervical spine, greatest at the C5-C6 and C6-C7 levels. Upper chest: Negative. Other: None IMPRESSION: 1. Extensive frontal scalp swelling without evidence for an underlying fracture. 2. No evidence of acute intracranial pathology. 3. No evidence of acute fracture or traumatic malalignment of the cervical spine. 4. Multilevel degenerative disc disease of the cervical spine, greatest at the C5-C6 and C6-C7 levels. Electronically Signed   By: Constance Holster M.D.   On: 12/02/2019 20:16   CT Cervical Spine Wo  Contrast  Result Date: 12/02/2019 CLINICAL DATA:  Acute pain due to trauma. Hematoma to mid forehead. EXAM: CT HEAD WITHOUT  CONTRAST CT CERVICAL SPINE WITHOUT CONTRAST TECHNIQUE: Multidetector CT imaging of the head and cervical spine was performed following the standard protocol without intravenous contrast. Multiplanar CT image reconstructions of the cervical spine were also generated. COMPARISON:  None. FINDINGS: CT HEAD FINDINGS Brain: No evidence of acute infarction, hemorrhage, hydrocephalus, extra-axial collection or mass lesion/mass effect. Advanced atrophy and chronic microvascular ischemic changes are noted. Vascular: No hyperdense vessel or unexpected calcification. Skull: There is extensive frontal scalp swelling with evidence for a cephalohematoma. There is no underlying calvarial defect or fracture. Sinuses/Orbits: No acute finding. Other: None. CT CERVICAL SPINE FINDINGS Alignment: There is reversal of the normal cervical lordotic curvature. Skull base and vertebrae: No acute fracture. No primary bone lesion or focal pathologic process. Soft tissues and spinal canal: No prevertebral fluid or swelling. No visible canal hematoma. Disc levels: Multilevel disc height loss is noted throughout the cervical spine, greatest at the C5-C6 and C6-C7 levels. Upper chest: Negative. Other: None IMPRESSION: 1. Extensive frontal scalp swelling without evidence for an underlying fracture. 2. No evidence of acute intracranial pathology. 3. No evidence of acute fracture or traumatic malalignment of the cervical spine. 4. Multilevel degenerative disc disease of the cervical spine, greatest at the C5-C6 and C6-C7 levels. Electronically Signed   By: Constance Holster M.D.   On: 12/02/2019 20:16    Procedures Procedures (including critical care time)  Medications Ordered in ED Medications - No data to display  ED Course  I have reviewed the triage vital signs and the nursing notes.  Pertinent labs & imaging  results that were available during my care of the patient were reviewed by me and considered in my medical decision making (see chart for details).    MDM Rules/Calculators/A&P                     From facility with unwitnessed fall. Hematoma to L forehead. Appears to be at baseline mentation.  CT head and C-spine are negative for acute acute traumatic injury.  He does have a large hematoma without intracranial damage.  Patient appears to be at her baseline mental status.  She is able to ambulate.  Attempted to contact her brother but there was no answer.  She appears stable to return to her facility.  ED ECG REPORT   Date: 12/02/2019  Rate: 64  Rhythm: sinus arrhythmia  QRS Axis: normal  Intervals: normal  ST/T Wave abnormalities: nonspecific ST/T changes  Conduction Disutrbances:none  Narrative Interpretation:   Old EKG Reviewed: unchanged  I have personally reviewed the EKG tracing and agree with the computerized printout as noted.  Final Clinical Impression(s) / ED Diagnoses Final diagnoses:  Fall, initial encounter  Injury of head, initial encounter    Rx / DC Orders ED Discharge Orders    None       Velecia Ovitt, Annie Main, MD 12/03/19 [phone]

## 2019-12-02 NOTE — ED Notes (Signed)
Pt fluid challenged with cup of water. She was able to successfully tolerate drinking water, no obvious signs of discomfort. Pt is resting comfortably

## 2019-12-02 NOTE — ED Triage Notes (Signed)
74 yo female BIB [facility] from [facility] facility. Pt was walking in the bedroom and fell and hit her head on nightstand. Fall was unwitnessed. Pt suffered hematoma to mid forehead. She is baseline dementia and nonverbal per EMS. Pt is not on anticoagulant therapy and does not have DNR.  Vitals: bp 178/86 Hr 74 spo2 100 % room air cbg 276 Temp 97.8

## 2019-12-02 NOTE — ED Notes (Signed)
Brother, Carol Rubio , wants update on his sister, [phone].

## 2019-12-03 NOTE — ED Notes (Signed)
[facility] states there is a delay in transportation. Patient still on the list to return to [facility].

## 2020-08-05 ENCOUNTER — Emergency Department (HOSPITAL_COMMUNITY)
Admission: EM | Admit: 2020-08-05 | Discharge: 2020-08-05 | Disposition: A | Discharge status: Home / Self Care | Payer: Medicare (Managed Care) | Attending: Emergency Medicine | Admitting: Emergency Medicine

## 2020-08-05 ENCOUNTER — Emergency Department (HOSPITAL_COMMUNITY): Payer: Medicare (Managed Care)

## 2020-08-05 ENCOUNTER — Encounter (HOSPITAL_COMMUNITY): Payer: Self-pay

## 2020-08-05 ENCOUNTER — Other Ambulatory Visit: Payer: Self-pay

## 2020-08-05 DIAGNOSIS — Z794 Long term (current) use of insulin: Secondary | ICD-10-CM | POA: Insufficient documentation

## 2020-08-05 DIAGNOSIS — Z79899 Other long term (current) drug therapy: Secondary | ICD-10-CM | POA: Diagnosis not present

## 2020-08-05 DIAGNOSIS — R0902 Hypoxemia: Secondary | ICD-10-CM | POA: Diagnosis present

## 2020-08-05 DIAGNOSIS — I110 Hypertensive heart disease with heart failure: Secondary | ICD-10-CM | POA: Diagnosis not present

## 2020-08-05 DIAGNOSIS — Z87891 Personal history of nicotine dependence: Secondary | ICD-10-CM | POA: Diagnosis not present

## 2020-08-05 DIAGNOSIS — R911 Solitary pulmonary nodule: Secondary | ICD-10-CM | POA: Diagnosis not present

## 2020-08-05 DIAGNOSIS — Z20822 Contact with and (suspected) exposure to covid-19: Secondary | ICD-10-CM | POA: Insufficient documentation

## 2020-08-05 DIAGNOSIS — I5032 Chronic diastolic (congestive) heart failure: Secondary | ICD-10-CM | POA: Diagnosis not present

## 2020-08-05 DIAGNOSIS — R404 Transient alteration of awareness: Secondary | ICD-10-CM | POA: Diagnosis not present

## 2020-08-05 DIAGNOSIS — Z7984 Long term (current) use of oral hypoglycemic drugs: Secondary | ICD-10-CM | POA: Diagnosis not present

## 2020-08-05 DIAGNOSIS — F0390 Unspecified dementia without behavioral disturbance: Secondary | ICD-10-CM | POA: Insufficient documentation

## 2020-08-05 LAB — CBC WITH DIFFERENTIAL/PLATELET
Abs Immature Granulocytes: 0.07 10*3/uL (ref 0.00–0.07)
Basophils Absolute: 0 10*3/uL (ref 0.0–0.1)
Basophils Relative: 0 %
Eosinophils Absolute: 0.1 10*3/uL (ref 0.0–0.5)
Eosinophils Relative: 1 %
HCT: 43.3 % (ref 36.0–46.0)
Hemoglobin: 14.2 g/dL (ref 12.0–15.0)
Immature Granulocytes: 1 %
Lymphocytes Relative: 20 %
Lymphs Abs: 1.1 10*3/uL (ref 0.7–4.0)
MCH: 27.5 pg (ref 26.0–34.0)
MCHC: 32.8 g/dL (ref 30.0–36.0)
MCV: 83.8 fL (ref 80.0–100.0)
Monocytes Absolute: 0.4 10*3/uL (ref 0.1–1.0)
Monocytes Relative: 7 %
Neutro Abs: 4 10*3/uL (ref 1.7–7.7)
Neutrophils Relative %: 71 %
Platelets: 91 10*3/uL — ABNORMAL LOW (ref 150–400)
RBC: 5.17 MIL/uL — ABNORMAL HIGH (ref 3.87–5.11)
RDW: 14.2 % (ref 11.5–15.5)
WBC: 5.6 10*3/uL (ref 4.0–10.5)
nRBC: 0 % (ref 0.0–0.2)

## 2020-08-05 LAB — RESPIRATORY PANEL BY RT PCR (FLU A&B, COVID)
Influenza A by PCR: NEGATIVE
Influenza B by PCR: NEGATIVE
SARS Coronavirus 2 by RT PCR: NEGATIVE

## 2020-08-05 LAB — BLOOD GAS, VENOUS
Acid-Base Excess: 2.8 mmol/L — ABNORMAL HIGH (ref 0.0–2.0)
Bicarbonate: 29 mmol/L — ABNORMAL HIGH (ref 20.0–28.0)
O2 Saturation: 40.4 %
Patient temperature: 98.6
pCO2, Ven: 52.7 mmHg (ref 44.0–60.0)
pH, Ven: 7.359 (ref 7.250–7.430)
pO2, Ven: 28.8 mmHg — CL (ref 32.0–45.0)

## 2020-08-05 LAB — COMPREHENSIVE METABOLIC PANEL
ALT: 28 U/L (ref 0–44)
AST: 24 U/L (ref 15–41)
Albumin: 4 g/dL (ref 3.5–5.0)
Alkaline Phosphatase: 102 U/L (ref 38–126)
Anion gap: 10 (ref 5–15)
BUN: 16 mg/dL (ref 8–23)
CO2: 25 mmol/L (ref 22–32)
Calcium: 9.5 mg/dL (ref 8.9–10.3)
Chloride: 102 mmol/L (ref 98–111)
Creatinine, Ser: 0.62 mg/dL (ref 0.44–1.00)
GFR calc non Af Amer: >60 mL/min (ref >60)
Glucose, Bld: 253 mg/dL — ABNORMAL HIGH (ref 70–99)
Potassium: 3.5 mmol/L (ref 3.5–5.1)
Sodium: 137 mmol/L (ref 135–145)
Total Bilirubin: 0.9 mg/dL (ref 0.3–1.2)
Total Protein: 8.2 g/dL — ABNORMAL HIGH (ref 6.5–8.1)

## 2020-08-05 MED ORDER — IOHEXOL 300 MG/ML  SOLN
75.0000 mL | Freq: Once | INTRAMUSCULAR | Status: AC | PRN
  Administered 2020-08-05: 75 mL via INTRAVENOUS

## 2020-08-05 NOTE — Discharge Instructions (Addendum)
The testing today did not show any serious problems that require further treatment or hospitalization at this time. The CAT scan did show a 7 mm nodule in the right middle lobe of her lung. To follow-up on this, the radiologist has recommended a noncontrast CT scan of the chest to be performed in 6 to 12 months to see how this is progressing. After that the medical management can be determined following usual guidelines. A CAT scan also showed some areas of chronic lung changes, without evidence for acute infection. This likely is related to prior infections that are not active at this time. If she develops fever, discolored or purulent sputum, she may require antibiotics for treatment. Otherwise, continue her usual medicines and treatments as currently planned. Follow-up with her doctor as needed for problems. The testing today did not show a Covid infection or influenza. Her oxygen levels were normal without oxygen supplementation.   Below is the radiologist impression of the CAT scan. This information is also available through her medical record, available on MyChart. IMPRESSION: 1. No acute cardiopulmonary findings. 2. Mild bronchiectasis with bronchial wall thickening and pleuroparenchymal scarring within the anterior aspect of the bilateral upper lobes and left infrahilar region. Findings may represent sequela of prior infection or inflammation including atypical organisms such as mycobacterium avium complex. 3. Well-marginated 7 mm round noncalcified pulmonary nodule within the right middle lobe. Non-contrast chest CT at 6-12 months is recommended. If the nodule is stable at time of repeat CT, then future CT at 18-24 months (from today's scan) is considered optional for low-risk patients, but is recommended for high-risk patients.

## 2020-08-05 NOTE — ED Notes (Signed)
Pt BIB EMS. Pt lives at [facility]). Staff stated that pt was very lethargic and did not want to get out of bed this morning. At baseline pt does have dementia and ambulates. Per staff pt was 82% RA and placed on 2LNC. Pts O2 stat with EMS was 99% RA.   BP-178/84 CBG-241 O2-98% Temp-97.6

## 2020-08-05 NOTE — ED Provider Notes (Signed)
[place] DEPT Provider Note   CSN: [id number] Arrival date & time: 08/05/20  4656     History Chief Complaint  Patient presents with  . Altered Mental Status    Carol Rubio is a 74 y.o. female.  HPI Patient presents with history of altered mental status that started this morning and was associated with hypoxia.  Reportedly EMS found her with saturations in the 70s.  On arrival here her oxygenation is normal on room air.  She is unable to give any history.  Level 5 caveat-dementia    Past Medical History:  Diagnosis Date  . Alzheimer's dementia ([place])    a. placed in facility in 2017 for wandering/combativeness.  . Anxiety   . Arthritis    SHOULDER,KNEES,BACK  . Bipolar disorder ([place])    Sees [facility]  . Cataract    had surgery for rt  . Chronic back pain   . Chronic hepatitis C ([place])   . Cirrhosis of liver ([place])   . Depression    Sees [facility]  . Diabetes mellitus   . Encephalopathy 09/2014   [facility]  . Glaucoma   . Hepatitis C virus   . History of kidney stones   . Hypertension   . Thrombocytopenia ([place])   . Urinary incontinence     Patient Active Problem List   Diagnosis Date Noted  . Encephalopathy   . Paroxysmal atrial fibrillation ([place]) 08/14/2016  . Fever 08/13/2016  . Hypoxia 08/13/2016  . HCAP (healthcare-associated pneumonia) 08/13/2016  . Arthritis of left knee 12/23/2014  . Thrombocytopenia ([place]) 11/18/2014  . History of substance abuse ([place]) 11/18/2014  . Former smoker 11/18/2014  . Chronic back pain 11/18/2014  . Glaucoma 11/18/2014  . Mixed incontinence 11/18/2014  . Anxiety state 11/18/2014  . Bipolar affective disorder ([place]) 11/18/2014  . History of CHF (congestive heart failure) 11/18/2014  . Encephalopathy, hepatic ([place]) 11/18/2014  . Diabetes type 2, controlled ([place]) 11/18/2014  . Diabetes mellitus ([place]) 09/16/2014  . Anxiety 09/16/2014  . Depression 09/16/2014    . Dementia ([place]) 09/16/2014  . Chronic diastolic CHF (congestive heart failure) ([place]) 09/16/2014  . Allergic rhinitis 09/16/2014  . Overactive bladder 09/16/2014  . Insomnia 09/16/2014  . Hepatic encephalopathy ([place])   . Chronic hepatitis C without hepatic coma ([place])   . Essential hypertension   . Weakness 09/08/2014  . Acute encephalopathy 09/08/2014    Past Surgical History:  Procedure Laterality Date  . CATARACT EXTRACTION W/ INTRAOCULAR LENS  IMPLANT, BILATERAL    . CHOLECYSTECTOMY    . EYE SURGERY Bilateral   . POLYPECTOMY       OB History   No obstetric history on file.     Family History  Problem Relation Age of Onset  . Stroke Mother   . Cancer Father   . Diabetes Sister   . Stroke Sister     Social History   Tobacco Use  . Smoking status: Former Smoker    Packs/day: 1.00    Years: 50.00    Pack years: 50.00    Types: Cigarettes    Quit date: 12/01/2004    Years since quitting: 15.6  . Smokeless tobacco: Never Used  . Tobacco comment: 43 pack year history  Substance Use Topics  . Alcohol use: No    Alcohol/week: 0.0 standard drinks    Comment: hx/o alcohol abuse in 50s and youngerquit 89  . Drug use: No    Comment: history of drug abuse  in 53s and younger    Home Medications Prior to Admission medications   Medication Sig Start Date End Date Taking? Authorizing Provider  acetaminophen (TYLENOL) 325 MG tablet Take 650 mg by mouth 3 (three) times daily as needed for mild pain or fever.   Yes [provider]  cholecalciferol (VITAMIN D) 25 MCG (1000 UNIT) tablet Take 1,000 Units by mouth daily.    Yes [provider]  lactulose (CHRONULAC) 10 GM/15ML solution Take 45-90 mLs (30-60 g total) by mouth daily as needed for mild constipation (titrate to have 2-3 Bowel movements daily.). Patient taking differently: Take 10 g by mouth daily.  11/18/14  Yes Tysinger, Camelia Eng, PA-C  lisinopril (PRINIVIL,ZESTRIL) 10 MG tablet Take 1 tablet (10 mg  total) by mouth daily. Patient taking differently: Take 30 mg by mouth daily.  02/09/15  Yes Tysinger, Camelia Eng, PA-C  Menthol, Topical Analgesic, (BIOFREEZE EX) Apply 1 application topically 2 (two) times daily as needed (knee pain).   Yes [provider]  metFORMIN (GLUCOPHAGE) 500 MG tablet Take 500 mg by mouth 2 (two) times daily with a meal.    Yes [provider]  metoprolol succinate (TOPROL-XL) 25 MG 24 hr tablet Take 25 mg by mouth daily.   Yes [provider]  Nutritional Supplements (RESOURCE 2.0 PO) Take 120 mLs by mouth in the morning, at noon, in the evening, and at bedtime. Vanilla   Yes [provider]  furosemide (LASIX) 40 MG tablet Take 40 mg by mouth once daily Patient not taking: Reported on 08/05/2020 08/16/16   Geradine Girt, DO  insulin glargine (LANTUS) 100 UNIT/ML injection Inject 0.15 mLs (15 Units total) into the skin at bedtime. Patient not taking: Reported on 04/14/2017 08/16/16   Geradine Girt, DO  OLANZapine (ZYPREXA) 5 MG tablet Take 1 tablet (5 mg total) by mouth at bedtime. Patient not taking: Reported on 08/05/2020 08/16/16   Geradine Girt, DO    Allergies    Tuberculin tests  Review of Systems   Review of Systems  Unable to perform ROS: Mental status change    Physical Exam Updated Vital Signs BP (!) 151/137   Pulse 93   Temp 97.8 F (36.6 C) (Rectal)   Resp 16   SpO2 100%   Physical Exam Vitals and nursing note reviewed.  Constitutional:      General: She is not in acute distress.    Appearance: She is well-developed. She is obese. She is not ill-appearing, toxic-appearing or diaphoretic.  HENT:     Head: Normocephalic and atraumatic.     Right Ear: External ear normal.     Left Ear: External ear normal.  Eyes:     Conjunctiva/sclera: Conjunctivae normal.     Pupils: Pupils are equal, round, and reactive to light.  Neck:     Trachea: Phonation normal.  Cardiovascular:     Rate and Rhythm: Normal  rate and regular rhythm.  Pulmonary:     Effort: Pulmonary effort is normal. No respiratory distress.     Breath sounds: No stridor.  Abdominal:     General: There is no distension.     Palpations: Abdomen is soft.  Musculoskeletal:        General: No swelling or tenderness. Normal range of motion.     Cervical back: Normal range of motion and neck supple.  Skin:    General: Skin is warm and dry.     Coloration: Skin is not jaundiced or  pale.  Neurological:     Mental Status: She is alert.     Cranial Nerves: No cranial nerve deficit.     Sensory: No sensory deficit.     Motor: No abnormal muscle tone.  Psychiatric:        Mood and Affect: Mood normal.        Behavior: Behavior normal.     ED Results / Procedures / Treatments   Labs (all labs ordered are listed, but only abnormal results are displayed) Labs Reviewed  COMPREHENSIVE METABOLIC PANEL - Abnormal; Notable for the following components:      Result Value   Glucose, Bld 253 (*)    Total Protein 8.2 (*)    All other components within normal limits  CBC WITH DIFFERENTIAL/PLATELET - Abnormal; Notable for the following components:   RBC 5.17 (*)    Platelets 91 (*)    All other components within normal limits  BLOOD GAS, VENOUS - Abnormal; Notable for the following components:   pO2, Ven 28.8 (*)    Bicarbonate 29.0 (*)    Acid-Base Excess 2.8 (*)    All other components within normal limits  RESPIRATORY PANEL BY RT PCR (FLU A&B, COVID)    EKG None  Radiology CT Chest W Contrast  Result Date: 08/05/2020 CLINICAL DATA:  Lethargy, hypoxia. Abnormal x-ray. COVID-19 negative. EXAM: CT CHEST WITH CONTRAST TECHNIQUE: Multidetector CT imaging of the chest was performed during intravenous contrast administration. CONTRAST:  7m OMNIPAQUE IOHEXOL 300 MG/ML  SOLN COMPARISON:  Chest x-ray 08/05/2020 FINDINGS: Cardiovascular: Heart size is normal. No pericardial effusion. Thoracic aorta is normal in course and caliber.  There are atherosclerotic calcifications of the aorta and coronary arteries. Main pulmonary trunk is nondilated. Mediastinum/Nodes: No axillary, mediastinal, or hilar lymphadenopathy. No thyroid nodule. Trachea within normal limits. Small hiatal hernia. Esophagus otherwise within normal limits. Lungs/Pleura: Within the anterior aspect of the bilateral upper lobes there is mild bronchiectasis with bronchial wall thickening and mild pleuroparenchymal scarring. Similar findings are present within the left infrahilar region. Mild centrilobular and paraseptal emphysema. No focal airspace consolidation. Well-marginated 7 mm round noncalcified pulmonary nodule adjacent to the right heart border within the right middle lobe (series 5, image 61). No pleural effusion or pneumothorax. Upper Abdomen: No acute findings. Musculoskeletal: No chest wall abnormality. No acute or significant osseous findings. Advanced degenerative changes of the bilateral shoulders, right worse than left. IMPRESSION: 1. No acute cardiopulmonary findings. 2. Mild bronchiectasis with bronchial wall thickening and pleuroparenchymal scarring within the anterior aspect of the bilateral upper lobes and left infrahilar region. Findings may represent sequela of prior infection or inflammation including atypical organisms such as mycobacterium avium complex. 3. Well-marginated 7 mm round noncalcified pulmonary nodule within the right middle lobe. Non-contrast chest CT at 6-12 months is recommended. If the nodule is stable at time of repeat CT, then future CT at 18-24 months (from today's scan) is considered optional for low-risk patients, but is recommended for high-risk patients. This recommendation follows the consensus statement: Guidelines for Management of Incidental Pulmonary Nodules Detected on CT Images: From the Fleischner Society 2017; Radiology 2017; 284:228-243. 4. Small hiatal hernia. Aortic Atherosclerosis (ICD10-I70.0) and Emphysema  (ICD10-J43.9). Electronically Signed   By: NDavina PokeD.O.   On: 08/05/2020 13:07   DG Chest Port 1 View  Result Date: 08/05/2020 CLINICAL DATA:  Hypoxia EXAM: PORTABLE CHEST 1 VIEW COMPARISON:  December 02, 2019 FINDINGS: There is persistent ill-defined opacity in the upper lobes and left mid lung  which may represent residual scarring. Lungs otherwise are clear. Heart is upper normal in size with pulmonary vascularity normal. No adenopathy. No bone lesions. IMPRESSION: Ill-defined opacity in each upper lobe and left mid lung, similar to prior study. Question scarring in these areas. Residual airspace opacity from atypical organism pneumonia could present in this manner. Correlation with COVID-19 status may be advisable. There is no consolidation or volume loss. No new opacity evident. Stable cardiac silhouette normal. No adenopathy appreciable. Electronically Signed   By: Lowella Grip III M.D.   On: 08/05/2020 10:26    Procedures Procedures (including critical care time)  Medications Ordered in ED Medications  iohexol (OMNIPAQUE) 300 MG/ML solution 75 mL (75 mLs Intravenous Contrast Given 08/05/20 1241)    ED Course  I have reviewed the triage vital signs and the nursing notes.  Pertinent labs & imaging results that were available during my care of the patient were reviewed by me and considered in my medical decision making (see chart for details).  Clinical Course as of Aug 05 1349  Thu Aug 05, 2020  1142 Discussion with patient's brother and a female family member named Nevin Bloodgood.  Findings discussed.  They are concerned that since the patient has a cough and was unresponsive this morning, that she requires a CT scan of the chest.  Currently the chest x-ray does not show signs or symptoms of acute abnormality but nonspecific chronic abnormalities.  Family members note no weight loss or hemoptysis history.  CT scan of the chest ordered to evaluate further, and clarify abnormal chest  x-ray.   [EW]    Clinical Course User Index [EW] Daleen Bo, MD   MDM Rules/Calculators/A&P                           Patient Vitals for the past 24 hrs:  BP Temp Temp src Pulse Resp SpO2  08/05/20 1300 (!) 151/137 -- -- 93 16 100 %  08/05/20 1130 (!) 146/102 -- -- (!) 106 16 95 %  08/05/20 1030 (!) 165/98 -- -- (!) 51 16 93 %  08/05/20 1006 -- 97.8 F (36.6 C) Rectal -- -- --  08/05/20 0953 -- -- -- -- -- 100 %  08/05/20 0951 (!) 180/107 -- -- 83 16 100 %    1:50 PM Reevaluation with update and discussion. After initial assessment and treatment, an updated evaluation reveals patient remained stable. I discussed the findings and follow-up recommendations with family members, [name] in the room, and another family member Scientist, research (physical sciences) by telephone. All questions were answered. Daleen Bo   Medical Decision Making:  This patient is presenting for evaluation of altered mental status, which does require a range of treatment options, and is a complaint that involves a moderate risk of morbidity and mortality. The differential diagnoses include pneumonia, Covid infection, worsening dementia. I decided to review old records, and in summary elderly female lives in assisted living facility, today with found to be altered, and hypoxic.  On arrival her oxygenation status is normal.  I obtained additional historical information from family members at the bedside.  Clinical Laboratory Tests Ordered, included CBC, Metabolic panel and Covid testing. Review indicates negative Covid, negative influenza, glucose elevated, platelets low, venous blood gas, does not indicate significant hypercapnia.. Radiologic Tests Ordered, included chest x-ray, CT scan chest.  I independently Visualized: Radiographic images, which show no acute infection. Small nodule less than 1 cm, right midlung. Areas of old infection, with  scarring and chronic changes.    Critical Interventions-clinical evaluation, laboratory  testing, radiography, observation, discussion with family members, reassessment  After These Interventions, the Patient was reevaluated and was found stable for discharge. Patient did not have persistent decreased responsiveness from baseline, or hypoxia, as she was found to have earlier today at her assisted care facility. There is no indication for further ED intervention, evaluation or hospitalization at this time.  Carol Rubio was evaluated in Emergency Department on 08/05/2020 for the symptoms described in the history of present illness. She was evaluated in the context of the global COVID-19 pandemic, which necessitated consideration that the patient might be at risk for infection with the SARS-CoV-2 virus that causes COVID-19. Institutional protocols and algorithms that pertain to the evaluation of patients at risk for COVID-19 are in a state of rapid change based on information released by regulatory bodies including the CDC and federal and state organizations. These policies and algorithms were followed during the patient's care in the ED.  CRITICAL CARE-no Performed by: Daleen Bo  Nursing Notes Reviewed/ Care Coordinated Applicable Imaging Reviewed Interpretation of Laboratory Data incorporated into ED treatment  The patient appears reasonably screened and/or stabilized for discharge and I doubt any other medical condition or other [facility] requiring further screening, evaluation, or treatment in the ED at this time prior to discharge.  Plan: Home Medications-continue usual; Home Treatments-gradual advance activity and diet; return here if the recommended treatment, does not improve the symptoms; Recommended follow up-PCP, as needed. Recommend 63-monthfollow-up for repeat CT scan of the chest to follow-up on the pulmonary nodule. Patient has chronic lung changes, that may predispose her to infection, and she should be considered for antibiotic treatment if she develops signs of acute  pulmonary infection.     Final Clinical Impression(s) / ED Diagnoses Final diagnoses:  Transient alteration of awareness  Dementia without behavioral disturbance, unspecified dementia type ([place]  Lung nodule    Rx / DC Orders ED Discharge Orders    None       WDaleen Bo MD 08/05/20 1402

## 2020-11-02 ENCOUNTER — Emergency Department (HOSPITAL_COMMUNITY): Payer: Medicare (Managed Care)

## 2020-11-02 ENCOUNTER — Encounter (HOSPITAL_COMMUNITY): Payer: Self-pay

## 2020-11-02 ENCOUNTER — Emergency Department (HOSPITAL_COMMUNITY)
Admission: EM | Admit: 2020-11-02 | Discharge: 2020-11-03 | Disposition: A | Discharge status: Home / Self Care | Payer: Medicare (Managed Care) | Attending: Emergency Medicine | Admitting: Emergency Medicine

## 2020-11-02 ENCOUNTER — Other Ambulatory Visit: Payer: Self-pay

## 2020-11-02 DIAGNOSIS — W050XXA Fall from non-moving wheelchair, initial encounter: Secondary | ICD-10-CM | POA: Insufficient documentation

## 2020-11-02 DIAGNOSIS — S0990XA Unspecified injury of head, initial encounter: Secondary | ICD-10-CM | POA: Diagnosis present

## 2020-11-02 DIAGNOSIS — F0390 Unspecified dementia without behavioral disturbance: Secondary | ICD-10-CM | POA: Diagnosis not present

## 2020-11-02 DIAGNOSIS — S0083XA Contusion of other part of head, initial encounter: Secondary | ICD-10-CM | POA: Diagnosis not present

## 2020-11-02 DIAGNOSIS — I5032 Chronic diastolic (congestive) heart failure: Secondary | ICD-10-CM | POA: Diagnosis not present

## 2020-11-02 DIAGNOSIS — T148XXA Other injury of unspecified body region, initial encounter: Secondary | ICD-10-CM

## 2020-11-02 DIAGNOSIS — J40 Bronchitis, not specified as acute or chronic: Secondary | ICD-10-CM | POA: Diagnosis not present

## 2020-11-02 DIAGNOSIS — Z20822 Contact with and (suspected) exposure to covid-19: Secondary | ICD-10-CM | POA: Diagnosis not present

## 2020-11-02 DIAGNOSIS — E119 Type 2 diabetes mellitus without complications: Secondary | ICD-10-CM | POA: Diagnosis not present

## 2020-11-02 DIAGNOSIS — Z794 Long term (current) use of insulin: Secondary | ICD-10-CM | POA: Insufficient documentation

## 2020-11-02 DIAGNOSIS — Z7984 Long term (current) use of oral hypoglycemic drugs: Secondary | ICD-10-CM | POA: Diagnosis not present

## 2020-11-02 DIAGNOSIS — Z87891 Personal history of nicotine dependence: Secondary | ICD-10-CM | POA: Insufficient documentation

## 2020-11-02 DIAGNOSIS — I110 Hypertensive heart disease with heart failure: Secondary | ICD-10-CM | POA: Diagnosis not present

## 2020-11-02 DIAGNOSIS — Z79899 Other long term (current) drug therapy: Secondary | ICD-10-CM | POA: Insufficient documentation

## 2020-11-02 DIAGNOSIS — W19XXXA Unspecified fall, initial encounter: Secondary | ICD-10-CM

## 2020-11-02 LAB — RESP PANEL BY RT-PCR (FLU A&B, COVID) ARPGX2
Influenza A by PCR: NEGATIVE
Influenza B by PCR: NEGATIVE
SARS Coronavirus 2 by RT PCR: NEGATIVE

## 2020-11-02 MED ORDER — AZITHROMYCIN 250 MG PO TABS: 250.0000 mg | ORAL_TABLET | Freq: Every day | ORAL | 0 refills | Status: AC

## 2020-11-02 MED ORDER — FUROSEMIDE 40 MG PO TABS
40.0000 mg | ORAL_TABLET | Freq: Once | ORAL | Status: AC
  Administered 2020-11-02: 40 mg via ORAL
  Filled 2020-11-02: qty 1

## 2020-11-02 MED ORDER — FUROSEMIDE 40 MG PO TABS: 40.0000 mg | ORAL_TABLET | Freq: Every day | ORAL | 0 refills | Status: AC

## 2020-11-02 MED ORDER — AZITHROMYCIN 250 MG PO TABS
500.0000 mg | ORAL_TABLET | Freq: Once | ORAL | Status: AC
  Administered 2020-11-02: 500 mg via ORAL
  Filled 2020-11-02: qty 2

## 2020-11-02 NOTE — Discharge Instructions (Signed)
1.  Your Covid test is negative.  You have been given a dose of Zithromax for your cough.  Start your prescription tomorrow evening and take 1 tablet daily for the next 4 days. 2.  You may need to resume your Lasix.  You have been given a dose in the emergency department this evening.  Take a 40 mg dose daily for the next 3 days. 3.  Follow-up with your doctor for recheck within the next 3 to 5 days. 4.  You may take extra strength Tylenol every 6 hours as needed for pain.

## 2020-11-02 NOTE — ED Triage Notes (Signed)
Patient arrived from [facility] with an unwitnessed fall from her wheelchair. Hematoma to the left side of her head. No blood thinners or LOC

## 2020-11-02 NOTE — ED Provider Notes (Signed)
[facility]-EMERGENCY DEPT Provider Note   CSN: [id number] Arrival date & time: 11/02/20  2025     History Chief Complaint  Patient presents with  . Fall    Carol Rubio is a 75 y.o. female.  HPI Patient has Alzheimer's dementia and at baseline is wheelchair-bound.  She reportedly slid out of her wheelchair and hit her forehead this evening.  No report of loss of consciousness.  Patient developed a large hematoma on the left forehead.  She is with her brother.  He reports her behavior and interactions are at baseline.  Over the phone have also spoken with patient sister who reports she has had a cough for about 10 days.  There not been any reported fever.  Patient has not appeared short of breath.  She is immunized for Covid and had her booster in November.  She lives in a group home setting.  Patient is not on any anticoagulants.    Past Medical History:  Diagnosis Date  . Alzheimer's dementia (HCC)    a. placed in facility in 2017 for wandering/combativeness.  . Anxiety   . Arthritis    SHOULDER,KNEES,BACK  . Bipolar disorder (HCC)    Sees [facility]  . Cataract    had surgery for rt  . Chronic back pain   . Chronic hepatitis C (HCC)   . Cirrhosis of liver (HCC)   . Depression    Sees [facility]  . Diabetes mellitus   . Encephalopathy 09/2014   [facility]  . Glaucoma   . Hepatitis C virus   . History of kidney stones   . Hypertension   . Thrombocytopenia (HCC)   . Urinary incontinence     Patient Active Problem List   Diagnosis Date Noted  . Encephalopathy   . Paroxysmal atrial fibrillation (HCC) 08/14/2016  . Fever 08/13/2016  . Hypoxia 08/13/2016  . HCAP (healthcare-associated pneumonia) 08/13/2016  . Arthritis of left knee 12/23/2014  . Thrombocytopenia (HCC) 11/18/2014  . History of substance abuse (HCC) 11/18/2014  . Former smoker 11/18/2014  . Chronic back pain 11/18/2014  . Glaucoma 11/18/2014  . Mixed  incontinence 11/18/2014  . Anxiety state 11/18/2014  . Bipolar affective disorder (HCC) 11/18/2014  . History of CHF (congestive heart failure) 11/18/2014  . Encephalopathy, hepatic (HCC) 11/18/2014  . Diabetes type 2, controlled (HCC) 11/18/2014  . Diabetes mellitus (HCC) 09/16/2014  . Anxiety 09/16/2014  . Depression 09/16/2014  . Dementia (HCC) 09/16/2014  . Chronic diastolic CHF (congestive heart failure) (HCC) 09/16/2014  . Allergic rhinitis 09/16/2014  . Overactive bladder 09/16/2014  . Insomnia 09/16/2014  . Hepatic encephalopathy (HCC)   . Chronic hepatitis C without hepatic coma (HCC)   . Essential hypertension   . Weakness 09/08/2014  . Acute encephalopathy 09/08/2014    Past Surgical History:  Procedure Laterality Date  . CATARACT EXTRACTION W/ INTRAOCULAR LENS  IMPLANT, BILATERAL    . CHOLECYSTECTOMY    . EYE SURGERY Bilateral   . POLYPECTOMY       OB History   No obstetric history on file.     Family History  Problem Relation Age of Onset  . Stroke Mother   . Cancer Father   . Diabetes Sister   . Stroke Sister     Social History   Tobacco Use  . Smoking status: Former Smoker    Packs/day: 1.00    Years: 50.00    Pack years: 50.00    Types: Cigarettes  Quit date: 12/01/2004    Years since quitting: 15.9  . Smokeless tobacco: Never Used  . Tobacco comment: 43 pack year history  Substance Use Topics  . Alcohol use: No    Alcohol/week: 0.0 standard drinks    Comment: hx/o alcohol abuse in 50s and youngerquit 95  . Drug use: No    Comment: history of drug abuse in 47s and younger    Home Medications Prior to Admission medications   Medication Sig Start Date End Date Taking? Authorizing Provider  azithromycin (ZITHROMAX) 250 MG tablet Take 1 tablet (250 mg total) by mouth daily. Start on 11/03/2020: 1 every day until finished 11/02/20  Yes Nareh Matzke, Jeannie Done, MD  furosemide (LASIX) 40 MG tablet Take 1 tablet (40 mg total) by mouth daily. 11/02/20  Yes  Charlesetta Shanks, MD  acetaminophen (TYLENOL) 325 MG tablet Take 650 mg by mouth 3 (three) times daily as needed for mild pain or fever.    [provider]  cholecalciferol (VITAMIN D) 25 MCG (1000 UNIT) tablet Take 1,000 Units by mouth daily.     [provider]  furosemide (LASIX) 40 MG tablet Take 40 mg by mouth once daily Patient not taking: Reported on 08/05/2020 08/16/16   Geradine Girt, DO  insulin glargine (LANTUS) 100 UNIT/ML injection Inject 0.15 mLs (15 Units total) into the skin at bedtime. Patient not taking: Reported on 04/14/2017 08/16/16   Geradine Girt, DO  lactulose (CHRONULAC) 10 GM/15ML solution Take 45-90 mLs (30-60 g total) by mouth daily as needed for mild constipation (titrate to have 2-3 Bowel movements daily.). Patient taking differently: Take 10 g by mouth daily.  11/18/14   Tysinger, Camelia Eng, PA-C  lisinopril (PRINIVIL,ZESTRIL) 10 MG tablet Take 1 tablet (10 mg total) by mouth daily. Patient taking differently: Take 30 mg by mouth daily.  02/09/15   Tysinger, Camelia Eng, PA-C  Menthol, Topical Analgesic, (BIOFREEZE EX) Apply 1 application topically 2 (two) times daily as needed (knee pain).    [provider]  metFORMIN (GLUCOPHAGE) 500 MG tablet Take 500 mg by mouth 2 (two) times daily with a meal.     [provider]  metoprolol succinate (TOPROL-XL) 25 MG 24 hr tablet Take 25 mg by mouth daily.    [provider]  Nutritional Supplements (RESOURCE 2.0 PO) Take 120 mLs by mouth in the morning, at noon, in the evening, and at bedtime. Vanilla    [provider]  OLANZapine (ZYPREXA) 5 MG tablet Take 1 tablet (5 mg total) by mouth at bedtime. Patient not taking: Reported on 08/05/2020 08/16/16   Geradine Girt, DO    Allergies    Tuberculin tests  Review of Systems   Review of Systems Level five caveat cannot obtain review of systems due to dementia. Physical Exam Updated Vital Signs BP (!) 178/86 (BP Location:  Left Arm)   Pulse 60   Temp 98.6 F (37 C) (Oral)   Resp 17   SpO2 100%   Physical Exam Constitutional:      Comments: Patient is alert.  She is sitting up in a wheelchair.  No respiratory distress.  Occasional cough.  HENT:     Head:     Comments: Large 8 cm hematoma left forehead.  Superficial abrasion no bleeding.    Mouth/Throat:     Mouth: Mucous membranes are moist.     Pharynx: Oropharynx is clear.  Eyes:     Extraocular Movements: Extraocular movements intact.  Cardiovascular:  Rate and Rhythm: Normal rate and regular rhythm.  Pulmonary:     Effort: Pulmonary effort is normal.     Breath sounds: Normal breath sounds.     Comments: Occasional cough.  No respiratory distress Abdominal:     General: There is no distension.     Palpations: Abdomen is soft.     Tenderness: There is no abdominal tenderness. There is no guarding.  Musculoskeletal:     Cervical back: Neck supple.     Comments: Patient at baseline is wheelchair-bound.  Have taken both hands and move patient's arms through range of motion.  She does not object or express any pain.  No deformities.  She has large arthritic knees but no appearance of effusions or deformities.  No peripheral edema.  Of move the patient's legs at the hips knees and ankles and she does not object or appear to have pain.  Baseline she is nonambulatory and falls frequently.  She is not tested for standing.  Skin:    General: Skin is warm and dry.  Neurological:     Comments: Patient is alert.  She does look around and have eye contact.  Some generated speech is not intelligible.  She is intermittently following commands.     ED Results / Procedures / Treatments   Labs (all labs ordered are listed, but only abnormal results are displayed) Labs Reviewed  RESP PANEL BY RT-PCR (FLU A&B, COVID) [id number]    EKG None  Radiology DG Chest 2 View  Result Date: 11/02/2020 CLINICAL DATA:  Un witnessed fall, dementia, cough EXAM: CHEST -  2 VIEW COMPARISON:  08/05/2020 FINDINGS: Frontal and lateral views of the chest are obtained. Lateral views are limited due to patient positioning. Cardiac silhouette is unremarkable. Patchy bilateral areas of ground-glass airspace disease are noted, greatest in the right upper and left mid lung zones. No effusion or pneumothorax. No acute bony abnormalities. IMPRESSION: 1. Bilateral ground-glass airspace disease which may reflect multifocal pneumonia or edema. Electronically Signed   By: Sharlet Salina M.D.   On: 11/02/2020 22:49   CT Head Wo Contrast  Result Date: 11/02/2020 CLINICAL DATA:  Larey Seat, left scalp hematoma, dimension EXAM: CT HEAD WITHOUT CONTRAST TECHNIQUE: Contiguous axial images were obtained from the base of the skull through the vertex without intravenous contrast. COMPARISON:  12/02/2019 FINDINGS: Brain: Stable hypodensities within the bilateral basal ganglia and periventricular white matter consistent with chronic small vessel ischemic change. No acute infarct or hemorrhage. Lateral ventricles and remaining midline structures are unremarkable. No acute extra-axial fluid collections. No mass effect. Vascular: No hyperdense vessel or unexpected calcification. Skull: Large left frontal scalp hematoma. No underlying fracture. The remainder of the calvarium is unremarkable. Sinuses/Orbits: No acute finding. Other: None. IMPRESSION: 1. Large left frontal scalp hematoma. 2. No acute intracranial process. 3. Chronic small vessel ischemic changes within the white matter and basal ganglia, stable. Electronically Signed   By: Sharlet Salina M.D.   On: 11/02/2020 23:02    Procedures Procedures (including critical care time)  Medications Ordered in ED Medications  azithromycin (ZITHROMAX) tablet 500 mg (500 mg Oral Given 11/02/20 2343)  furosemide (LASIX) tablet 40 mg (40 mg Oral Given 11/02/20 2344)    ED Course  I have reviewed the triage vital signs and the nursing notes.  Pertinent labs &  imaging results that were available during my care of the patient were reviewed by me and considered in my medical decision making (see chart for details).  MDM Rules/Calculators/A&P                          Patient is alert and at her baseline.  CT does not show any intracranial injury.  He has a large hematoma which can be managed symptomatically.  Patient's family members reported some cough for several days.  Patient tested negative for Covid.  She shows no signs of respiratory distress.  No hypoxia, tachypnea or abnormal pulmonary exam.  Chest x-ray does exhibit some abnormality, will start Zithromax.  Also will provide several days of Lasix.  Patient needs close follow-up with PCP.  Clinically she shows no signs of distress and stable for discharge. Final Clinical Impression(s) / ED Diagnoses Final diagnoses:  Fall, initial encounter  Hematoma  Bronchitis    Rx / DC Orders ED Discharge Orders         Ordered    azithromycin (ZITHROMAX) 250 MG tablet  Daily        11/02/20 2358    furosemide (LASIX) 40 MG tablet  Daily        11/02/20 2358           Charlesetta Shanks, MD 11/03/20 0000

## 2020-11-03 NOTE — ED Notes (Signed)
[facility] called for transport.  

## 2020-11-03 NOTE — ED Notes (Signed)
Nursing facility [facility] was called and aware of patients departure from hospital.

## 2021-04-11 ENCOUNTER — Other Ambulatory Visit (HOSPITAL_COMMUNITY): Payer: Self-pay | Admitting: Nurse Practitioner

## 2021-04-11 ENCOUNTER — Other Ambulatory Visit: Payer: Self-pay | Admitting: Nurse Practitioner

## 2021-04-11 DIAGNOSIS — R1310 Dysphagia, unspecified: Secondary | ICD-10-CM

## 2021-04-14 ENCOUNTER — Other Ambulatory Visit (HOSPITAL_COMMUNITY): Payer: Self-pay

## 2021-04-14 DIAGNOSIS — R1310 Dysphagia, unspecified: Secondary | ICD-10-CM

## 2021-04-22 ENCOUNTER — Encounter (HOSPITAL_COMMUNITY): Payer: Self-pay

## 2021-04-22 ENCOUNTER — Other Ambulatory Visit (HOSPITAL_COMMUNITY): Payer: Medicare (Managed Care)

## 2021-04-27 ENCOUNTER — Other Ambulatory Visit: Payer: Self-pay

## 2021-04-27 ENCOUNTER — Ambulatory Visit (HOSPITAL_COMMUNITY)
Admission: RE | Admit: 2021-04-27 | Discharge: 2021-04-27 | Disposition: A | Discharge status: Home / Self Care | Payer: Medicare (Managed Care) | Source: Ambulatory Visit | Attending: Family Medicine | Admitting: Family Medicine

## 2021-04-27 DIAGNOSIS — R1312 Dysphagia, oropharyngeal phase: Secondary | ICD-10-CM | POA: Insufficient documentation

## 2021-04-27 DIAGNOSIS — R32 Unspecified urinary incontinence: Secondary | ICD-10-CM | POA: Insufficient documentation

## 2021-04-27 DIAGNOSIS — R1310 Dysphagia, unspecified: Secondary | ICD-10-CM | POA: Diagnosis present

## 2021-04-27 NOTE — Progress Notes (Signed)
Modified Barium Swallow Progress Note  Patient Details  Name: Carol Rubio MRN: [id number] Date of Birth: Jun 05, 1946  Today's Date: 04/27/2021  Modified Barium Swallow completed.  Full report located under Chart Review in the Imaging Section.  Brief recommendations include the following:  Clinical Impression  Pt presented with oropharyngeal dysphagia which is likely cognitively based. She demonstrated impaired posterior propulsion and a pharyngeal delay. Lingual pumping was consistently noted; boluses were propelled to the valleculae in a piecemeal manner, and the swallow was initiated after all the bolus had been individually propelled to the pharynx. Oral and pharyngeal clearance were adequate. Transient penetration (PAS 2) was noted once when spillover of thin liquids was noted to the pyriform sinuses. However, this is WNL and no instances of aspiration were observed. Mastication was mildly prolonged, but pt's family reported that the pt has always been a "slow eater". Considering pt's baseline cough and her presentation during the study, SLP questions the relatedness of all instances of coughing to p.o. intake. However, considering bolus aggregation, pt's frequent movement during the study, and her demonstrated difficulty with attention, aspiration is possible under less ideal settings (e.g., eating reclined with visual/auditory distractions). A dysphagia 3 diet with thin liquids is recommended at this time, but this may be advanced further/modified at the treating SLP's discretion.   Swallow Evaluation Recommendations       SLP Diet Recommendations: Dysphagia 3 (Mech soft) solids;Thin liquid   Liquid Administration via: Cup;Straw   Medication Administration: Crushed with puree   Supervision: Full assist for feeding;Staff to assist with self feeding   Compensations: Slow rate;Small sips/bites   Postural Changes: Remain semi-upright after after feeds/meals (Comment);Seated upright at 90  degrees   Oral Care Recommendations: Oral care BID      Carol Rubio I. Hardin Negus, [place], [place] Office number [phone] Pager [phone]  Horton Marshall 04/27/2021,2:17 PM

## 2021-05-20 ENCOUNTER — Emergency Department (HOSPITAL_COMMUNITY): Payer: Medicare (Managed Care)

## 2021-05-20 ENCOUNTER — Emergency Department (HOSPITAL_COMMUNITY)
Admission: EM | Admit: 2021-05-20 | Discharge: 2021-05-20 | Disposition: A | Discharge status: Home / Self Care | Payer: Medicare (Managed Care) | Attending: Emergency Medicine | Admitting: Emergency Medicine

## 2021-05-20 ENCOUNTER — Encounter (HOSPITAL_COMMUNITY): Payer: Self-pay

## 2021-05-20 ENCOUNTER — Other Ambulatory Visit: Payer: Self-pay

## 2021-05-20 DIAGNOSIS — I5032 Chronic diastolic (congestive) heart failure: Secondary | ICD-10-CM | POA: Insufficient documentation

## 2021-05-20 DIAGNOSIS — Z794 Long term (current) use of insulin: Secondary | ICD-10-CM | POA: Insufficient documentation

## 2021-05-20 DIAGNOSIS — I110 Hypertensive heart disease with heart failure: Secondary | ICD-10-CM | POA: Diagnosis not present

## 2021-05-20 DIAGNOSIS — R569 Unspecified convulsions: Secondary | ICD-10-CM | POA: Diagnosis not present

## 2021-05-20 DIAGNOSIS — G309 Alzheimer's disease, unspecified: Secondary | ICD-10-CM | POA: Insufficient documentation

## 2021-05-20 DIAGNOSIS — Z79899 Other long term (current) drug therapy: Secondary | ICD-10-CM | POA: Insufficient documentation

## 2021-05-20 DIAGNOSIS — Z7984 Long term (current) use of oral hypoglycemic drugs: Secondary | ICD-10-CM | POA: Insufficient documentation

## 2021-05-20 DIAGNOSIS — E119 Type 2 diabetes mellitus without complications: Secondary | ICD-10-CM | POA: Diagnosis not present

## 2021-05-20 DIAGNOSIS — Z87891 Personal history of nicotine dependence: Secondary | ICD-10-CM | POA: Insufficient documentation

## 2021-05-20 DIAGNOSIS — R1310 Dysphagia, unspecified: Secondary | ICD-10-CM | POA: Insufficient documentation

## 2021-05-20 LAB — CBC WITH DIFFERENTIAL/PLATELET
Abs Immature Granulocytes: 0.01 10*3/uL (ref 0.00–0.07)
Basophils Absolute: 0 10*3/uL (ref 0.0–0.1)
Basophils Relative: 0 %
Eosinophils Absolute: 0.1 10*3/uL (ref 0.0–0.5)
Eosinophils Relative: 1 %
HCT: 44.6 % (ref 36.0–46.0)
Hemoglobin: 13.8 g/dL (ref 12.0–15.0)
Immature Granulocytes: 0 %
Lymphocytes Relative: 15 %
Lymphs Abs: 0.7 10*3/uL (ref 0.7–4.0)
MCH: 26.2 pg (ref 26.0–34.0)
MCHC: 30.9 g/dL (ref 30.0–36.0)
MCV: 84.6 fL (ref 80.0–100.0)
Monocytes Absolute: 0.3 10*3/uL (ref 0.1–1.0)
Monocytes Relative: 5 %
Neutro Abs: 3.6 10*3/uL (ref 1.7–7.7)
Neutrophils Relative %: 79 %
Platelets: 69 10*3/uL — ABNORMAL LOW (ref 150–400)
RBC: 5.27 MIL/uL — ABNORMAL HIGH (ref 3.87–5.11)
RDW: 14.9 % (ref 11.5–15.5)
WBC: 4.7 10*3/uL (ref 4.0–10.5)
nRBC: 0 % (ref 0.0–0.2)

## 2021-05-20 LAB — COMPREHENSIVE METABOLIC PANEL
ALT: 23 U/L (ref 0–44)
AST: 27 U/L (ref 15–41)
Albumin: 3.3 g/dL — ABNORMAL LOW (ref 3.5–5.0)
Alkaline Phosphatase: 87 U/L (ref 38–126)
Anion gap: 11 (ref 5–15)
BUN: 13 mg/dL (ref 8–23)
CO2: 21 mmol/L — ABNORMAL LOW (ref 22–32)
Calcium: 9.6 mg/dL (ref 8.9–10.3)
Chloride: 103 mmol/L (ref 98–111)
Creatinine, Ser: 0.66 mg/dL (ref 0.44–1.00)
GFR, Estimated: >60 mL/min (ref >60)
Glucose, Bld: 216 mg/dL — ABNORMAL HIGH (ref 70–99)
Potassium: 4.2 mmol/L (ref 3.5–5.1)
Sodium: 135 mmol/L (ref 135–145)
Total Bilirubin: 0.6 mg/dL (ref 0.3–1.2)
Total Protein: 7.3 g/dL (ref 6.5–8.1)

## 2021-05-20 LAB — CBG MONITORING, ED: Glucose-Capillary: 219 mg/dL — ABNORMAL HIGH (ref 70–99)

## 2021-05-20 LAB — URINALYSIS, ROUTINE W REFLEX MICROSCOPIC
Bilirubin Urine: NEGATIVE
Glucose, UA: 50 mg/dL — AB
Hgb urine dipstick: NEGATIVE
Ketones, ur: NEGATIVE mg/dL
Leukocytes,Ua: NEGATIVE
Nitrite: NEGATIVE
Protein, ur: NEGATIVE mg/dL
Specific Gravity, Urine: 1.018 (ref 1.005–1.030)
pH: 6 (ref 5.0–8.0)

## 2021-05-20 NOTE — Discharge Instructions (Addendum)
Please follow the following diet recommendations to decrease risk of aspiration:  SLP Diet Recommendations: Dysphagia 3 (Mech soft) solids;Thin liquid    Liquid Administration via: Cup;Straw    Medication Administration: Crushed with puree    Supervision: Full assist for feeding;Staff to assist with self feeding    Compensations: Slow rate;Small sips/bites    Postural Changes: Remain semi-upright after after feeds/meals (Comment);Seated upright at 90 degrees    Oral Care Recommendations: Oral care BID

## 2021-05-20 NOTE — ED Triage Notes (Signed)
Was eating breakfast this AM baseline not alert or oriented and some  what verbal at baseline currently not moving around like baseline. Responds to pain

## 2021-05-20 NOTE — ED Triage Notes (Signed)
Tonic clonic 20 sec lasting seizure

## 2021-05-20 NOTE — ED Provider Notes (Signed)
[facility] EMERGENCY DEPARTMENT Provider Note   CSN: [id number] Arrival date & time: 05/20/21  [id number]     History Chief Complaint  Patient presents with   Seizures    Carol Rubio is a 75 y.o. female.  75 year old female with prior medical history as detailed below presents for evaluation.  Patient arrives from [facility].  Patient was transported by EMS.  Per report, patient was at breakfast this morning.  She was noted to have a possible choking episode versus possible seizure.  She had a mouthful of food and then had generalized tonic-clonic shaking.  The shaking lasted approximately 20 seconds.  She may have been choking in the middle of this episode.  She was not observed to have respiratory arrest or apparent cardiac arrest.  After the episode she seemed more lethargic than baseline.  She does have a history of dementia.  She is minimally verbal at baseline.  She is reportedly normally alert at baseline. Level 5 caveat secondary to dementia.  Patient without prior history of seizure.  The history is provided by the patient, the EMS personnel and medical records.  Seizures Seizure activity on arrival: no   Seizure type:  Unable to specify Initial focality:  Unable to specify Episode characteristics: abnormal movements and unresponsiveness   Postictal symptoms: somnolence   Return to baseline: no   Severity:  Unable to specify Duration:  20 seconds Timing:  Once Progression:  Improving     Past Medical History:  Diagnosis Date   Alzheimer's dementia ([place])    a. placed in facility in 2017 for wandering/combativeness.   Anxiety    Arthritis    SHOULDER,KNEES,BACK   Bipolar disorder ([place])    Sees [facility]   Cataract    had surgery for rt   Chronic back pain    Chronic hepatitis C ([place])    Cirrhosis of liver ([place])    Depression    Sees [facility]   Diabetes mellitus    Encephalopathy 09/2014   [facility]    Glaucoma    Hepatitis C virus    History of kidney stones    Hypertension    Thrombocytopenia ([place])    Urinary incontinence     Patient Active Problem List   Diagnosis Date Noted   Encephalopathy    Paroxysmal atrial fibrillation ([place]) 08/14/2016   Fever 08/13/2016   Hypoxia 08/13/2016   HCAP (healthcare-associated pneumonia) 08/13/2016   Arthritis of left knee 12/23/2014   Thrombocytopenia ([place]) 11/18/2014   History of substance abuse ([place]) 11/18/2014   Former smoker 11/18/2014   Chronic back pain 11/18/2014   Glaucoma 11/18/2014   Mixed incontinence 11/18/2014   Anxiety state 11/18/2014   Bipolar affective disorder ([place]) 11/18/2014   History of CHF (congestive heart failure) 11/18/2014   Encephalopathy, hepatic ([place]) 11/18/2014   Diabetes type 2, controlled ([place]) 11/18/2014   Diabetes mellitus ([place]) 09/16/2014   Anxiety 09/16/2014   Depression 09/16/2014   Dementia ([place]) 09/16/2014   Chronic diastolic CHF (congestive heart failure) ([place]) 09/16/2014   Allergic rhinitis 09/16/2014   Overactive bladder 09/16/2014   Insomnia 09/16/2014   Hepatic encephalopathy (HCC)    Chronic hepatitis C without hepatic coma ([place])    Essential hypertension    Weakness 09/08/2014   Acute encephalopathy 09/08/2014    Past Surgical History:  Procedure Laterality Date   CATARACT EXTRACTION W/ INTRAOCULAR LENS  IMPLANT, BILATERAL     CHOLECYSTECTOMY     EYE SURGERY Bilateral  POLYPECTOMY       OB History   No obstetric history on file.     Family History  Problem Relation Age of Onset   Stroke Mother    Cancer Father    Diabetes Sister    Stroke Sister     Social History   Tobacco Use   Smoking status: Former    Packs/day: 1.00    Years: 50.00    Pack years: 50.00    Types: Cigarettes    Quit date: 12/01/2004    Years since quitting: 16.4   Smokeless tobacco: Never   Tobacco comments:    43 pack year history  Substance Use Topics   Alcohol use: No     Alcohol/week: 0.0 standard drinks    Comment: hx/o alcohol abuse in 50s and youngerquit 95   Drug use: No    Comment: history of drug abuse in 81s and younger    Home Medications Prior to Admission medications   Medication Sig Start Date End Date Taking? Authorizing Provider  acetaminophen (TYLENOL) 325 MG tablet Take 650 mg by mouth 3 (three) times daily as needed for mild pain or fever.    [provider]  azithromycin (ZITHROMAX) 250 MG tablet Take 1 tablet (250 mg total) by mouth daily. Start on 11/03/2020: 1 every day until finished 11/02/20   Charlesetta Shanks, MD  cholecalciferol (VITAMIN D) 25 MCG (1000 UNIT) tablet Take 1,000 Units by mouth daily.     [provider]  furosemide (LASIX) 40 MG tablet Take 40 mg by mouth once daily Patient not taking: Reported on 08/05/2020 08/16/16   Geradine Girt, DO  furosemide (LASIX) 40 MG tablet Take 1 tablet (40 mg total) by mouth daily. 11/02/20   Charlesetta Shanks, MD  insulin glargine (LANTUS) 100 UNIT/ML injection Inject 0.15 mLs (15 Units total) into the skin at bedtime. Patient not taking: Reported on 04/14/2017 08/16/16   Geradine Girt, DO  lactulose (CHRONULAC) 10 GM/15ML solution Take 45-90 mLs (30-60 g total) by mouth daily as needed for mild constipation (titrate to have 2-3 Bowel movements daily.). Patient taking differently: Take 10 g by mouth daily.  11/18/14   Tysinger, Camelia Eng, PA-C  lisinopril (PRINIVIL,ZESTRIL) 10 MG tablet Take 1 tablet (10 mg total) by mouth daily. Patient taking differently: Take 30 mg by mouth daily.  02/09/15   Tysinger, Camelia Eng, PA-C  Menthol, Topical Analgesic, (BIOFREEZE EX) Apply 1 application topically 2 (two) times daily as needed (knee pain).    [provider]  metFORMIN (GLUCOPHAGE) 500 MG tablet Take 500 mg by mouth 2 (two) times daily with a meal.     [provider]  metoprolol succinate (TOPROL-XL) 25 MG 24 hr tablet Take 25 mg by mouth daily.    [provider]  Nutritional Supplements (RESOURCE 2.0 PO) Take 120 mLs by mouth in the morning, at noon, in the evening, and at bedtime. Vanilla    [provider]  OLANZapine (ZYPREXA) 5 MG tablet Take 1 tablet (5 mg total) by mouth at bedtime. Patient not taking: Reported on 08/05/2020 08/16/16   Geradine Girt, DO    Allergies    Tuberculin tests  Review of Systems   Review of Systems  Neurological:  Positive for seizures.  All other systems reviewed and are negative.  Physical Exam Updated Vital Signs SpO2 99%   Physical Exam Vitals and nursing note reviewed.  Constitutional:      General: She is not  in acute distress.    Appearance: Normal appearance. She is well-developed.  HENT:     Head: Normocephalic and atraumatic.  Eyes:     Conjunctiva/sclera: Conjunctivae normal.     Pupils: Pupils are equal, round, and reactive to light.  Cardiovascular:     Rate and Rhythm: Normal rate and regular rhythm.     Heart sounds: Normal heart sounds.  Pulmonary:     Effort: Pulmonary effort is normal. No respiratory distress.     Breath sounds: Normal breath sounds.  Abdominal:     General: There is no distension.     Palpations: Abdomen is soft.     Tenderness: There is no abdominal tenderness.  Musculoskeletal:        General: No deformity. Normal range of motion.     Cervical back: Normal range of motion and neck supple.  Skin:    General: Skin is warm and dry.  Neurological:     General: No focal deficit present.     Mental Status: She is alert.    ED Results / Procedures / Treatments   Labs (all labs ordered are listed, but only abnormal results are displayed) Labs Reviewed - No data to display  EKG None  Radiology No results found.  Procedures Procedures   Medications Ordered in ED Medications - No data to display  ED Course  I have reviewed the triage vital signs and the nursing notes.  Pertinent labs & imaging results that were available during my care  of the patient were reviewed by me and considered in my medical decision making (see chart for details).    MDM Rules/Calculators/A&P                           MDM  MSE complete  Carol Rubio was evaluated in Emergency Department on 05/20/2021 for the symptoms described in the history of present illness. She was evaluated in the context of the global COVID-19 pandemic, which necessitated consideration that the patient might be at risk for infection with the SARS-CoV-2 virus that causes COVID-19. Institutional protocols and algorithms that pertain to the evaluation of patients at risk for COVID-19 are in a state of rapid change based on information released by regulatory bodies including the CDC and federal and state organizations. These policies and algorithms were followed during the patient's care in the ED.  Patient presented from [facility] for evaluation after episode of choking and shaking that occurred during a meal this morning.  Described symptoms are most consistent with a choking episode.  Patient is describe symptoms are not consistent with seizure.  Patient without prior history of seizure.  Patient with advanced dementia and is essentially nonverbal at baseline.  Screening labs obtained in the ED are without significant abnormality.  Screening imaging studies are without significant abnormality.  Patient with longstanding history of dysphagia.  Patient with recent swallowing study and work-up for same.  Plan of care discussed with the patient's brother at bedside.  He would prefer that she be returned to [facility].  Patient appears to be appropriate for discharge.  Importance of close follow-up is stressed.  Strict return precautions given and understood.   Final Clinical Impression(s) / ED Diagnoses Final diagnoses:  Dysphagia, unspecified type    Rx / DC Orders ED Discharge Orders     None        Valarie Merino, MD 05/20/21 1322
